# Patient Record
Sex: Female | Born: 1945 | Race: White | Hispanic: No | Smoking: Never smoker
Health system: Southern US, Community
[De-identification: ages and names within clinical notes are randomized; demographics above are authoritative.]

## PROBLEM LIST (undated history)

## (undated) DIAGNOSIS — M549 Dorsalgia, unspecified: Secondary | ICD-10-CM

## (undated) DIAGNOSIS — M1990 Unspecified osteoarthritis, unspecified site: Secondary | ICD-10-CM

## (undated) DIAGNOSIS — C642 Malignant neoplasm of left kidney, except renal pelvis: Secondary | ICD-10-CM

## (undated) DIAGNOSIS — Z87442 Personal history of urinary calculi: Secondary | ICD-10-CM

## (undated) DIAGNOSIS — K219 Gastro-esophageal reflux disease without esophagitis: Secondary | ICD-10-CM

## (undated) DIAGNOSIS — E785 Hyperlipidemia, unspecified: Secondary | ICD-10-CM

## (undated) DIAGNOSIS — R102 Pelvic and perineal pain: Secondary | ICD-10-CM

## (undated) DIAGNOSIS — I10 Essential (primary) hypertension: Secondary | ICD-10-CM

## (undated) DIAGNOSIS — N200 Calculus of kidney: Secondary | ICD-10-CM

## (undated) DIAGNOSIS — E010 Iodine-deficiency related diffuse (endemic) goiter: Secondary | ICD-10-CM

## (undated) DIAGNOSIS — C801 Malignant (primary) neoplasm, unspecified: Secondary | ICD-10-CM

## (undated) DIAGNOSIS — R0600 Dyspnea, unspecified: Secondary | ICD-10-CM

## (undated) DIAGNOSIS — R7303 Prediabetes: Secondary | ICD-10-CM

## (undated) DIAGNOSIS — I7300 Raynaud's syndrome without gangrene: Secondary | ICD-10-CM

## (undated) DIAGNOSIS — D219 Benign neoplasm of connective and other soft tissue, unspecified: Secondary | ICD-10-CM

## (undated) DIAGNOSIS — M439 Deforming dorsopathy, unspecified: Secondary | ICD-10-CM

## (undated) DIAGNOSIS — M8580 Other specified disorders of bone density and structure, unspecified site: Secondary | ICD-10-CM

## (undated) DIAGNOSIS — N809 Endometriosis, unspecified: Secondary | ICD-10-CM

## (undated) DIAGNOSIS — I5189 Other ill-defined heart diseases: Secondary | ICD-10-CM

## (undated) DIAGNOSIS — E538 Deficiency of other specified B group vitamins: Secondary | ICD-10-CM

## (undated) DIAGNOSIS — R112 Nausea with vomiting, unspecified: Secondary | ICD-10-CM

## (undated) DIAGNOSIS — I340 Nonrheumatic mitral (valve) insufficiency: Secondary | ICD-10-CM

## (undated) DIAGNOSIS — N6019 Diffuse cystic mastopathy of unspecified breast: Secondary | ICD-10-CM

## (undated) DIAGNOSIS — R413 Other amnesia: Secondary | ICD-10-CM

## (undated) DIAGNOSIS — M810 Age-related osteoporosis without current pathological fracture: Secondary | ICD-10-CM

## (undated) DIAGNOSIS — Z9889 Other specified postprocedural states: Secondary | ICD-10-CM

## (undated) HISTORY — DX: Hyperlipidemia, unspecified: E78.5

## (undated) HISTORY — DX: Gastro-esophageal reflux disease without esophagitis: K21.9

## (undated) HISTORY — DX: Dorsalgia, unspecified: M54.9

## (undated) HISTORY — DX: Age-related osteoporosis without current pathological fracture: M81.0

## (undated) HISTORY — DX: Benign neoplasm of connective and other soft tissue, unspecified: D21.9

## (undated) HISTORY — DX: Deforming dorsopathy, unspecified: M43.9

## (undated) HISTORY — PX: OOPHORECTOMY: SHX86

## (undated) HISTORY — PX: ABDOMINAL HYSTERECTOMY: SHX81

## (undated) HISTORY — DX: Pelvic and perineal pain: R10.2

## (undated) HISTORY — DX: Calculus of kidney: N20.0

---

## 1997-10-15 HISTORY — PX: BILATERAL OOPHORECTOMY: SHX1221

## 2004-11-20 ENCOUNTER — Ambulatory Visit: Payer: Self-pay | Admitting: Internal Medicine

## 2005-05-14 ENCOUNTER — Ambulatory Visit: Payer: Self-pay | Admitting: Internal Medicine

## 2005-05-23 ENCOUNTER — Ambulatory Visit: Payer: Self-pay | Admitting: Internal Medicine

## 2005-08-28 ENCOUNTER — Ambulatory Visit: Payer: Self-pay | Admitting: Internal Medicine

## 2006-07-09 ENCOUNTER — Ambulatory Visit: Payer: Self-pay | Admitting: Internal Medicine

## 2007-01-31 ENCOUNTER — Ambulatory Visit: Payer: Self-pay | Admitting: Internal Medicine

## 2007-06-24 ENCOUNTER — Ambulatory Visit: Payer: Self-pay | Admitting: Internal Medicine

## 2007-06-30 ENCOUNTER — Ambulatory Visit: Payer: Self-pay | Admitting: Internal Medicine

## 2007-08-12 ENCOUNTER — Ambulatory Visit: Payer: Self-pay | Admitting: Internal Medicine

## 2008-01-02 ENCOUNTER — Ambulatory Visit: Payer: Self-pay | Admitting: Obstetrics and Gynecology

## 2008-07-06 ENCOUNTER — Ambulatory Visit: Payer: Self-pay | Admitting: Obstetrics and Gynecology

## 2008-08-17 ENCOUNTER — Ambulatory Visit: Payer: Self-pay | Admitting: Internal Medicine

## 2009-08-22 ENCOUNTER — Ambulatory Visit: Payer: Self-pay | Admitting: Internal Medicine

## 2010-08-25 ENCOUNTER — Ambulatory Visit: Payer: Self-pay | Admitting: Internal Medicine

## 2011-09-03 ENCOUNTER — Ambulatory Visit: Payer: Self-pay | Admitting: Internal Medicine

## 2012-09-18 ENCOUNTER — Ambulatory Visit: Payer: Self-pay | Admitting: Internal Medicine

## 2013-04-30 DIAGNOSIS — D239 Other benign neoplasm of skin, unspecified: Secondary | ICD-10-CM

## 2013-04-30 HISTORY — DX: Other benign neoplasm of skin, unspecified: D23.9

## 2013-09-22 ENCOUNTER — Ambulatory Visit: Payer: Self-pay | Admitting: Internal Medicine

## 2014-03-16 DIAGNOSIS — I10 Essential (primary) hypertension: Secondary | ICD-10-CM | POA: Insufficient documentation

## 2014-03-16 DIAGNOSIS — M1990 Unspecified osteoarthritis, unspecified site: Secondary | ICD-10-CM | POA: Insufficient documentation

## 2014-03-16 DIAGNOSIS — E785 Hyperlipidemia, unspecified: Secondary | ICD-10-CM | POA: Insufficient documentation

## 2014-03-16 DIAGNOSIS — E010 Iodine-deficiency related diffuse (endemic) goiter: Secondary | ICD-10-CM | POA: Insufficient documentation

## 2014-03-16 DIAGNOSIS — I7300 Raynaud's syndrome without gangrene: Secondary | ICD-10-CM | POA: Insufficient documentation

## 2014-03-16 DIAGNOSIS — M810 Age-related osteoporosis without current pathological fracture: Secondary | ICD-10-CM | POA: Insufficient documentation

## 2014-04-22 DIAGNOSIS — N6019 Diffuse cystic mastopathy of unspecified breast: Secondary | ICD-10-CM | POA: Insufficient documentation

## 2014-04-22 DIAGNOSIS — N200 Calculus of kidney: Secondary | ICD-10-CM | POA: Insufficient documentation

## 2014-09-23 ENCOUNTER — Ambulatory Visit: Payer: Self-pay | Admitting: Internal Medicine

## 2015-05-09 ENCOUNTER — Ambulatory Visit
Admission: RE | Admit: 2015-05-09 | Discharge: 2015-05-09 | Disposition: A | Discharge status: Home / Self Care | Payer: Medicare Other | Source: Ambulatory Visit | Attending: Gastroenterology | Admitting: Gastroenterology

## 2015-05-09 ENCOUNTER — Ambulatory Visit: Payer: Medicare Other | Admitting: Anesthesiology

## 2015-05-09 ENCOUNTER — Encounter
Admission: RE | Disposition: A | Discharge status: Home / Self Care | Payer: Self-pay | Source: Ambulatory Visit | Attending: Gastroenterology

## 2015-05-09 DIAGNOSIS — D123 Benign neoplasm of transverse colon: Secondary | ICD-10-CM | POA: Insufficient documentation

## 2015-05-09 DIAGNOSIS — E785 Hyperlipidemia, unspecified: Secondary | ICD-10-CM | POA: Diagnosis not present

## 2015-05-09 DIAGNOSIS — K648 Other hemorrhoids: Secondary | ICD-10-CM | POA: Diagnosis not present

## 2015-05-09 DIAGNOSIS — I10 Essential (primary) hypertension: Secondary | ICD-10-CM | POA: Diagnosis not present

## 2015-05-09 DIAGNOSIS — Z1211 Encounter for screening for malignant neoplasm of colon: Secondary | ICD-10-CM | POA: Diagnosis present

## 2015-05-09 HISTORY — DX: Raynaud's syndrome without gangrene: I73.00

## 2015-05-09 HISTORY — DX: Hyperlipidemia, unspecified: E78.5

## 2015-05-09 HISTORY — DX: Endometriosis, unspecified: N80.9

## 2015-05-09 HISTORY — DX: Essential (primary) hypertension: I10

## 2015-05-09 HISTORY — DX: Calculus of kidney: N20.0

## 2015-05-09 HISTORY — DX: Iodine-deficiency related diffuse (endemic) goiter: E01.0

## 2015-05-09 HISTORY — PX: COLONOSCOPY WITH PROPOFOL: SHX5780

## 2015-05-09 HISTORY — DX: Other specified disorders of bone density and structure, unspecified site: M85.80

## 2015-05-09 HISTORY — DX: Diffuse cystic mastopathy of unspecified breast: N60.19

## 2015-05-09 SURGERY — COLONOSCOPY WITH PROPOFOL
Anesthesia: General

## 2015-05-09 MED ORDER — PROPOFOL 10 MG/ML IV BOLUS
INTRAVENOUS | Status: DC | PRN
  Administered 2015-05-09: 35 mg via INTRAVENOUS

## 2015-05-09 MED ORDER — PHENYLEPHRINE HCL 10 MG/ML IJ SOLN
INTRAMUSCULAR | Status: DC | PRN
  Administered 2015-05-09 (×5): 100 ug via INTRAVENOUS
  Administered 2015-05-09: 50 ug via INTRAVENOUS

## 2015-05-09 MED ORDER — SODIUM CHLORIDE 0.9 % IV SOLN
INTRAVENOUS | Status: DC
  Administered 2015-05-09: 1000 mL via INTRAVENOUS

## 2015-05-09 MED ORDER — MIDAZOLAM HCL 5 MG/5ML IJ SOLN
INTRAMUSCULAR | Status: DC | PRN
  Administered 2015-05-09: 1 mg via INTRAVENOUS

## 2015-05-09 MED ORDER — SODIUM CHLORIDE 0.9 % IV SOLN: INTRAVENOUS | Status: DC

## 2015-05-09 MED ORDER — PROPOFOL INFUSION 10 MG/ML OPTIME
INTRAVENOUS | Status: DC | PRN
  Administered 2015-05-09: 120 ug/kg/min via INTRAVENOUS

## 2015-05-09 MED ORDER — FENTANYL CITRATE (PF) 100 MCG/2ML IJ SOLN
INTRAMUSCULAR | Status: DC | PRN
  Administered 2015-05-09: 50 ug via INTRAVENOUS

## 2015-05-09 NOTE — Transfer of Care (Signed)
Immediate Anesthesia Transfer of Care Note  Patient: Jessica Morrison  Procedure(s) Performed: Procedure(s): COLONOSCOPY WITH PROPOFOL (N/A)  Patient Location: PACU  Anesthesia Type:General  Level of Consciousness: sedated  Airway & Oxygen Therapy: Patient Spontanous Breathing and Patient connected to nasal cannula oxygen  Post-op Assessment: Report given to RN and Post -op Vital signs reviewed and stable  Post vital signs: Reviewed and stable  Last Vitals:  Filed Vitals:   05/09/15 1020  BP: 94/52  Pulse:   Temp: 36 C  Resp: 16    Complications: No apparent anesthesia complications

## 2015-05-09 NOTE — Anesthesia Postprocedure Evaluation (Signed)
  Anesthesia Post-op Note  Patient: Jessica Morrison  Procedure(s) Performed: Procedure(s): COLONOSCOPY WITH PROPOFOL (N/A)  Anesthesia type:General  Patient location: PACU  Post pain: Pain level controlled  Post assessment: Post-op Vital signs reviewed, Patient's Cardiovascular Status Stable, Respiratory Function Stable, Patent Airway and No signs of Nausea or vomiting  Post vital signs: Reviewed and stable  Last Vitals:  Filed Vitals:   05/09/15 1049  BP: 115/66  Pulse: 71  Temp:   Resp: 19    Level of consciousness: awake, alert  and patient cooperative  Complications: No apparent anesthesia complications

## 2015-05-09 NOTE — Anesthesia Preprocedure Evaluation (Signed)
Anesthesia Evaluation  Patient identified by MRN, date of birth, ID band Patient awake    Reviewed: Allergy & Precautions, NPO status , Patient's Chart, lab work & pertinent test results  History of Anesthesia Complications (+) PONV  Airway Mallampati: II  TM Distance: >3 FB Neck ROM: Full    Dental  (+) Teeth Intact   Pulmonary          Cardiovascular     Neuro/Psych    GI/Hepatic GERD-  Controlled,  Endo/Other    Renal/GU Renal disease (Stones)     Musculoskeletal   Abdominal   Peds  Hematology   Anesthesia Other Findings   Reproductive/Obstetrics                             Anesthesia Physical Anesthesia Plan  ASA: II  Anesthesia Plan: General   Post-op Pain Management:    Induction: Intravenous  Airway Management Planned: Nasal Cannula  Additional Equipment:   Intra-op Plan:   Post-operative Plan:   Informed Consent: I have reviewed the patients History and Physical, chart, labs and discussed the procedure including the risks, benefits and alternatives for the proposed anesthesia with the patient or authorized representative who has indicated his/her understanding and acceptance.     Plan Discussed with:   Anesthesia Plan Comments:         Anesthesia Quick Evaluation

## 2015-05-09 NOTE — Op Note (Signed)
[facility] Gastroenterology Patient Name: Jessica Morrison Procedure Date: 05/09/2015 9:19 AM MRN: [id number] Account #: [id number] Date of Birth: 16-Aug-1946 Admit Type: Outpatient Age: 69 Room: [facility] ENDO ROOM 2 Gender: Female Note Status: Finalized Procedure:         Colonoscopy Indications:       Screening for colorectal malignant neoplasm, This is the                     patient's first colonoscopy Patient Profile:   This is a 69 year old female. Providers:         Gerrit Heck. Rayann Heman, MD Referring MD:      Leonie Douglas. Doy Hutching, MD (Referring MD) Medicines:         Propofol per Anesthesia Complications:     No immediate complications. Procedure:         Pre-Anesthesia Assessment:                    - Prior to the procedure, a History and Physical was                     performed, and patient medications, allergies and                     sensitivities were reviewed. The patient's tolerance of                     previous anesthesia was reviewed.                    After obtaining informed consent, the colonoscope was                     passed under direct vision. Throughout the procedure, the                     patient's blood pressure, pulse, and oxygen saturations                     were monitored continuously. The Colonoscope was                     introduced through the anus and advanced to the the                     terminal ileum. The patient tolerated the procedure well.                     The quality of the bowel preparation was excellent. The                     colonoscopy was extremely difficult due to a tortuous                     colon. Successful completion of the procedure was aided by                     using manual pressure and changing patient position. Findings:      A 8 mm polyp was found in the proximal transverse colon. The polyp was       flat. The polyp was removed with a hot snare. Resection and retrieval       were complete.      A  3 mm polyp was found in the mid transverse colon.  The polyp was       sessile. The polyp was removed with a jumbo cold forceps. Resection and       retrieval were complete.      Internal hemorrhoids were found during retroflexion. The hemorrhoids       were Grade I (internal hemorrhoids that do not prolapse).      The exam was otherwise without abnormality.      The perianal exam findings include non-thrombosed external hemorrhoids. Impression:        - One 8 mm polyp in the proximal transverse colon.                     Resected and retrieved.                    - One 3 mm polyp in the mid transverse colon. Resected and                     retrieved.                    - Internal hemorrhoids.                    - The examination was otherwise normal.                    - Non-thrombosed external hemorrhoids found on perianal                     exam. Recommendation:    - Observe patient in GI recovery unit.                    - Continue present medications.                    - Repeat colonoscopy for surveillance based on pathology                     results, but procedure was very difficult due to tortuos                     colon and need to consider risks and benefits before                     repeating.                    - Return to referring physician.                    - The findings and recommendations were discussed with the                     patient.                    - The findings and recommendations were discussed with the                     patient's family. Procedure Code(s): --- Professional ---                    [id number], Colonoscopy, flexible; with removal of tumor(s),                     polyp(s), or other lesion(s) by snare technique  45380, 59, Colonoscopy, flexible; with biopsy, single or                     multiple CPT copyright 2014 American Medical Association. All rights reserved. The codes documented in this report are preliminary and  upon coder review may  be revised to meet current compliance requirements. Mellody Life, MD 05/09/2015 10:19:38 AM This report has been signed electronically. Number of Addenda: 0 Note Initiated On: 05/09/2015 9:19 AM Scope Withdrawal Time: 0 hours 20 minutes 39 seconds  Total Procedure Duration: 0 hours 40 minutes 22 seconds       [facility]

## 2015-05-09 NOTE — Discharge Instructions (Signed)

## 2015-05-09 NOTE — H&P (Signed)
  Primary Care Physician:  Idelle Crouch, MD  Pre-Procedure History & Physical: HPI:  Jessica Morrison is a 69 y.o. female is here for an colonoscopy.   Past Medical History  Diagnosis Date  . Endometriosis   . Nephrolithiasis   . Hyperlipemia   . Fibrocystic disease of breast   . Raynaud disease   . Thyromegaly   . Osteopenia   . Hypertension     Past Surgical History  Procedure Laterality Date  . Abdominal hysterectomy    . Oophorectomy      Prior to Admission medications   Medication Sig Start Date End Date Taking? Authorizing Provider  aspirin EC 81 MG tablet Take 81 mg by mouth daily.   Yes Historical Provider, MD  Calcium Carb-Cholecalciferol 500-125 MG-UNIT TABS Take 1 tablet by mouth.   Yes Historical Provider, MD  Cholecalciferol 2000 UNITS CAPS Take 1 capsule by mouth.   Yes Historical Provider, MD  raloxifene (EVISTA) 60 MG tablet Take 60 mg by mouth daily.   Yes Historical Provider, MD  rosuvastatin (CRESTOR) 5 MG tablet Take 5 mg by mouth daily.   Yes Historical Provider, MD    Allergies as of 04/26/2015  . (Not on File)    No family history on file.  History   Social History  . Marital Status: Married    Spouse Name: N/A  . Number of Children: N/A  . Years of Education: N/A   Occupational History  . Not on file.   Social History Main Topics  . Smoking status: Not on file  . Smokeless tobacco: Not on file  . Alcohol Use: Not on file  . Drug Use: Not on file  . Sexual Activity: Not on file   Other Topics Concern  . Not on file   Social History Narrative  . No narrative on file     Physical Exam: BP 144/74 mmHg  Pulse 90  Temp(Src) 98.6 F (37 C) (Tympanic)  Resp 16  Ht 4\' 10"  (1.473 m)  Wt 51.71 kg (114 lb)  BMI 23.83 kg/m2  SpO2 92% General:   Alert,  pleasant and cooperative in NAD Head:  Normocephalic and atraumatic. Neck:  Supple; no masses or thyromegaly. Lungs:  Clear throughout to auscultation.    Heart:  Regular rate  and rhythm. Abdomen:  Soft, nontender and nondistended. Normal bowel sounds, without guarding, and without rebound.   Neurologic:  Alert and  oriented x4;  grossly normal neurologically.  Impression/Plan: Jessica Morrison is here for an colonoscopy to be performed for screening, avg risk   Risks, benefits, limitations, and alternatives regarding  colonoscpy have been reviewed with the patient.  Questions have been answered.  All parties agreeable.   Jessica Class, MD  05/09/2015, 9:25 AM

## 2015-05-10 ENCOUNTER — Encounter: Payer: Self-pay | Admitting: Gastroenterology

## 2015-05-10 LAB — SURGICAL PATHOLOGY

## 2015-08-16 ENCOUNTER — Ambulatory Visit (INDEPENDENT_AMBULATORY_CARE_PROVIDER_SITE_OTHER): Payer: Medicare Other | Admitting: Obstetrics and Gynecology

## 2015-08-16 ENCOUNTER — Encounter: Payer: Self-pay | Admitting: Obstetrics and Gynecology

## 2015-08-16 VITALS — BP 129/72 | HR 83 | Ht <= 58 in | Wt 116.4 lb

## 2015-08-16 DIAGNOSIS — N952 Postmenopausal atrophic vaginitis: Secondary | ICD-10-CM | POA: Diagnosis not present

## 2015-08-16 DIAGNOSIS — M810 Age-related osteoporosis without current pathological fracture: Secondary | ICD-10-CM

## 2015-08-16 DIAGNOSIS — N809 Endometriosis, unspecified: Secondary | ICD-10-CM | POA: Insufficient documentation

## 2015-08-16 MED ORDER — RALOXIFENE HCL 60 MG PO TABS: 60.0000 mg | ORAL_TABLET | Freq: Every day | ORAL | Status: DC

## 2015-08-16 NOTE — Patient Instructions (Signed)
1.  Continue with calcium and vitamin D supplementation. 2.  Continue with weightbearing exercise. 3.  E Vista 60 mg daily is refilled. 4.  Over-the-counter lubricants and virginal, as well as recommended for lubrication. 5.  Stool guaiac testing is not needed this year due to colonoscopy in July 2016. 6.  Patient needs yearly mammograms. 7.  Return in 1 year.

## 2015-08-16 NOTE — Progress Notes (Signed)
Patient ID: Jessica Morrison, female   DOB: Jul 14, 1946, 69 y.o.   MRN: [id number] ANNUAL PREVENTATIVE CARE GYN  ENCOUNTER NOTE  Subjective:       Jessica Morrison is a 69 y.o. G68P1101 female here for a routine annual gynecologic exam.  Current complaints: 1.osteoporosis 2.  Vaginal atrophy.  Patient presents for follow-up on above issues.  She is status post hysterectomy.  She is on Evista for osteoporosis.  She has concerns about vulvar health as mother is deceased from melanoma of the vulva.  Patient has not noted any vulvar lesions.  She does have vaginal atrophy and is not frequently sexually active.  She is advised to use OTC lubricants virginal well.  She is taking calcium with vitamin D.  She did have colonoscopy this past year which showed 2 polyps.  She is to have repeat colonoscopy in 5 years.   Gynecologic History No LMP recorded. Patient has had a hysterectomy. Contraception: status post hysterectomy Last Pap: unsure last pap. Results were: unsure Last mammogram: 2015. Results were: normal  Obstetric History OB History  Gravida Para Term Preterm AB SAB TAB Ectopic Multiple Living  2 2 1 1      1     # Outcome Date GA Lbr Len/2nd Weight Sex Delivery Anes PTL Lv  2 Term 1976   6 lb (2.722 kg) F CS-LTranv   Y  1 Preterm 1976        FD      Past Medical History  Diagnosis Date  . Endometriosis   . Nephrolithiasis   . Hyperlipemia   . Fibrocystic disease of breast   . Raynaud disease   . Thyromegaly   . Osteopenia   . Hypertension   . Curvature of spine   . Back pain   . Fibroid   . Kidney stone   . Osteoporosis   . GERD (gastroesophageal reflux disease)   . Pelvic pain in female   . Endometriosis   . HLD (hyperlipidemia)     Past Surgical History  Procedure Laterality Date  . Abdominal hysterectomy    . Oophorectomy    . Colonoscopy with propofol N/A 05/09/2015    Procedure: COLONOSCOPY WITH PROPOFOL;  Surgeon: Josefine Class, MD;  Location: [facility]  ENDOSCOPY;  Service: Endoscopy;  Laterality: N/A;  . Cesarean section    . Bilateral oophorectomy  1999    Current Outpatient Prescriptions on File Prior to Visit  Medication Sig Dispense Refill  . aspirin EC 81 MG tablet Take 81 mg by mouth daily.    . calcium-vitamin D (CALCIUM 500/D) 500-200 MG-UNIT tablet Take by mouth.    . Cholecalciferol 2000 UNITS CAPS Take 1 capsule by mouth.    . raloxifene (EVISTA) 60 MG tablet Take 60 mg by mouth daily.    . rosuvastatin (CRESTOR) 5 MG tablet Take 5 mg by mouth daily.     No current facility-administered medications on file prior to visit.    Allergies  Allergen Reactions  . Codeine Nausea And Vomiting  . Neosporin Wound Cleanser [Benzalkonium Chloride] Other (See Comments)    Skin blisters  . Penicillins     Social History   Social History  . Marital Status: Married    Spouse Name: N/A  . Number of Children: N/A  . Years of Education: N/A   Occupational History  . Not on file.   Social History Main Topics  . Smoking status: Never Smoker   . Smokeless tobacco: Not on  file  . Alcohol Use: No  . Drug Use: No  . Sexual Activity: Not Currently    Birth Control/ Protection: Surgical   Other Topics Concern  . Not on file   Social History Narrative    Family History  Problem Relation Age of Onset  . Emphysema Father   . Cancer Neg Hx   . Heart disease Neg Hx   . Diabetes Neg Hx     The following portions of the patient's history were reviewed and updated as appropriate: allergies, current medications, past family history, past medical history, past social history, past surgical history and problem list.  Review of Systems ROS Review of Systems - General ROS: negative for - chills, fatigue, fever, hot flashes, night sweats, weight gain or weight loss Psychological ROS: negative for - anxiety, decreased libido, depression, mood swings, physical abuse or sexual abuse Ophthalmic ROS: negative for - blurry vision, eye  pain or loss of vision ENT ROS: negative for - headaches, hearing change, visual changes or vocal changes Allergy and Immunology ROS: negative for - hives, itchy/watery eyes or seasonal allergies Hematological and Lymphatic ROS: negative for - bleeding problems, bruising, swollen lymph nodes or weight loss Endocrine ROS: negative for - galactorrhea, hair pattern changes, hot flashes, malaise/lethargy, mood swings, palpitations, polydipsia/polyuria, skin changes, temperature intolerance or unexpected weight changes Breast ROS: negative for - new or changing breast lumps or nipple discharge Respiratory ROS: negative for - cough or shortness of breath Cardiovascular ROS: negative for - chest pain, irregular heartbeat, palpitations or shortness of breath Gastrointestinal ROS: no abdominal pain, change in bowel habits, or black or bloody stools Genito-Urinary ROS: no dysuria, trouble voiding, or hematuria Musculoskeletal ROS: negative for - joint pain or joint stiffness Neurological ROS: negative for - bowel and bladder control changes Dermatological ROS: negative for rash and skin lesion changes   Objective:   BP 129/72 mmHg  Pulse 83  Ht 4\' 10"  (1.473 m)  Wt 116 lb 6.4 oz (52.799 kg)  BMI 24.33 kg/m2 CONSTITUTIONAL: Well-developed, well-nourished female in no acute distress.  PSYCHIATRIC: Normal mood and affect. Normal behavior. Normal judgment and thought content. Bellamy: Alert and oriented to person, place, and time. Normal muscle tone coordination. No cranial nerve deficit noted. HENT:  Normocephalic, atraumatic, External right and left ear normal. Oropharynx is clear and moist EYES: Conjunctivae and EOM are normal. Pupils are equal, round, and reactive to light. No scleral icterus.  NECK: Normal range of motion, supple, no masses.  Normal thyroid.  SKIN: Skin is warm and dry. No rash noted. Not diaphoretic. No erythema. No pallor. CARDIOVASCULAR: Normal heart rate noted, regular  rhythm, no murmur. RESPIRATORY: Clear to auscultation bilaterally. Effort and breath sounds normal, no problems with respiration noted. BREASTS: Symmetric in size. No masses, skin changes, nipple drainage, or lymphadenopathy.dense breasts with fibrocystic changes present ABDOMEN: Soft, normal bowel sounds, no distention noted.  No tenderness, rebound or guarding. Midline scar is healed; no hernias BLADDER: Normal PELVIC:  External Genitalia: Normal; no lesions  BUS: Normal  Vagina: moderate atrophy  Cervix: surgically absent  Uterus: surgically absent  Adnexa: Normal  RV: External Exam NormaI, No Rectal Masses and Normal Sphincter tone  MUSCULOSKELETAL: Normal range of motion. No tenderness.  No cyanosis, clubbing, or edema.  2+ distal pulses. LYMPHATIC: No Axillary, Supraclavicular, or Inguinal Adenopathy.    Assessment:   Annual gynecologic examination 69 y.o. Contraception: status post hysterectomy Normal BMI Problem List Items Addressed This Visit    None  Plan:  Pap: Not needed Mammogram: Ordered thru pcp Stool Guaiac Testing:  Not Indicated- colonoscopy- 04/2015 2 polyps removed Labs: thur pcp Routine preventative health maintenance measures emphasized: Exercise/Diet/Weight control and Alcohol/Substance use risks Evista refilled 1 year. Continue with calcium and vitamin D supplementation Return to [place], [place]  Brayton Mars, MD  Note: This dictation was prepared with Dragon dictation along with smaller phrase technology. Any transcriptional errors that result from this process are unintentional.

## 2015-09-28 ENCOUNTER — Other Ambulatory Visit: Payer: Self-pay | Admitting: Internal Medicine

## 2015-09-28 DIAGNOSIS — Z1231 Encounter for screening mammogram for malignant neoplasm of breast: Secondary | ICD-10-CM

## 2015-10-13 ENCOUNTER — Other Ambulatory Visit: Payer: Self-pay | Admitting: Internal Medicine

## 2015-10-13 ENCOUNTER — Ambulatory Visit
Admission: RE | Admit: 2015-10-13 | Discharge: 2015-10-13 | Disposition: A | Discharge status: Home / Self Care | Payer: Medicare Other | Source: Ambulatory Visit | Attending: Internal Medicine | Admitting: Internal Medicine

## 2015-10-13 DIAGNOSIS — Z1231 Encounter for screening mammogram for malignant neoplasm of breast: Secondary | ICD-10-CM | POA: Insufficient documentation

## 2016-08-16 ENCOUNTER — Other Ambulatory Visit: Payer: Self-pay | Admitting: Obstetrics and Gynecology

## 2016-08-20 NOTE — Progress Notes (Signed)
Patient ID: Jessica Morrison, female   DOB: [birth year], 70 y.o.   MRN: [id number] ANNUAL PREVENTATIVE CARE GYN  ENCOUNTER NOTE  Subjective:       Jessica Morrison is a 70 y.o. G56P1101 female here for a routine annual gynecologic exam.  Current complaints:    She is status post hysterectomy. She is not sexually active.  She is on Evista for osteoporosis.    Patient has to care for her husband who has had Parkinson's for the past 17 years. Family history of vulvar melanoma-mom  Gynecologic History No LMP recorded. Patient has had a hysterectomy. Contraception: status post hysterectomy Last Pap: unsure last pap. Results were: unsure Last mammogram: 10/13/15. Results were: normal Colonoscopy: 2016  Obstetric History OB History  Gravida Para Term Preterm AB Living  2 2 1 1   1   SAB TAB Ectopic Multiple Live Births          1    # Outcome Date GA Lbr Len/2nd Weight Sex Delivery Anes PTL Lv  2 Term 1976   6 lb (2.722 kg) F CS-LTranv   LIV  1 Preterm 1976        FD      Past Medical History:  Diagnosis Date  . Back pain   . Curvature of spine   . Endometriosis   . Endometriosis   . Fibrocystic disease of breast   . Fibroid   . GERD (gastroesophageal reflux disease)   . HLD (hyperlipidemia)   . Hyperlipemia   . Hypertension   . Kidney stone   . Nephrolithiasis   . Osteopenia   . Osteoporosis   . Pelvic pain in female   . Raynaud disease   . Thyromegaly     Past Surgical History:  Procedure Laterality Date  . ABDOMINAL HYSTERECTOMY    . BILATERAL OOPHORECTOMY  1999  . CESAREAN SECTION    . COLONOSCOPY WITH PROPOFOL N/A 05/09/2015   Procedure: COLONOSCOPY WITH PROPOFOL;  Surgeon: Josefine Class, MD;  Location: [facility] ENDOSCOPY;  Service: Endoscopy;  Laterality: N/A;  . OOPHORECTOMY      Current Outpatient Prescriptions on File Prior to Visit  Medication Sig Dispense Refill  . aspirin EC 81 MG tablet Take 81 mg by mouth daily.    . calcium-vitamin D (CALCIUM  500/D) 500-200 MG-UNIT tablet Take by mouth.    . Cholecalciferol 2000 UNITS CAPS Take 1 capsule by mouth.    . raloxifene (EVISTA) 60 MG tablet take 1 tablet by mouth once daily 30 tablet 0  . rosuvastatin (CRESTOR) 5 MG tablet Take 5 mg by mouth daily.     No current facility-administered medications on file prior to visit.     Allergies  Allergen Reactions  . Codeine Nausea And Vomiting  . Neosporin Wound Cleanser [Benzalkonium Chloride] Other (See Comments)    Skin blisters  . Penicillins     Social History   Social History  . Marital status: Married    Spouse name: N/A  . Number of children: N/A  . Years of education: N/A   Occupational History  . Not on file.   Social History Main Topics  . Smoking status: Never Smoker  . Smokeless tobacco: Not on file  . Alcohol use No  . Drug use: No  . Sexual activity: Not Currently    Birth control/ protection: Surgical   Other Topics Concern  . Not on file   Social History Narrative  . No narrative on file  Family History  Problem Relation Age of Onset  . Emphysema Father   . Cancer Neg Hx   . Heart disease Neg Hx   . Diabetes Neg Hx   . Breast cancer Neg Hx     The following portions of the patient's history were reviewed and updated as appropriate: allergies, current medications, past family history, past medical history, past social history, past surgical history and problem list.  Review of Systems ROS Review of Systems - General ROS: negative for - chills, fatigue, fever, hot flashes, night sweats, weight gain or weight loss Psychological ROS: negative for - anxiety, decreased libido, depression, mood swings, physical abuse or sexual abuse Ophthalmic ROS: negative for - blurry vision, eye pain or loss of vision ENT ROS: negative for - headaches, hearing change, visual changes or vocal changes Allergy and Immunology ROS: negative for - hives, itchy/watery eyes or seasonal allergies Hematological and  Lymphatic ROS: negative for - bleeding problems, bruising, swollen lymph nodes or weight loss Endocrine ROS: negative for - galactorrhea, hair pattern changes, hot flashes, malaise/lethargy, mood swings, palpitations, polydipsia/polyuria, skin changes, temperature intolerance or unexpected weight changes Breast ROS: negative for - new or changing breast lumps or nipple discharge Respiratory ROS: negative for - cough or shortness of breath Cardiovascular ROS: negative for - chest pain, irregular heartbeat, palpitations or shortness of breath Gastrointestinal ROS: no abdominal pain, change in bowel habits, or black or bloody stools Genito-Urinary ROS: no dysuria, trouble voiding, or hematuria Musculoskeletal ROS: negative for - joint pain or joint stiffness Neurological ROS: negative for - bowel and bladder control changes Dermatological ROS: negative for rash and skin lesion changes   Objective:   BP 132/76 (BP Location: Left Arm, Patient Position: Sitting, Cuff Size: Normal)   Pulse 67   Resp 16   Ht 4' 11.5" (1.511 m)   Wt 114 lb (51.7 kg)   BMI 22.64 kg/m  CONSTITUTIONAL: Well-developed, well-nourished female in no acute distress.  PSYCHIATRIC: Normal mood and affect. Normal behavior. Normal judgment and thought content. [place]: Alert and oriented to person, place, and time. Normal muscle tone coordination. No cranial nerve deficit noted. HENT:  Normocephalic, atraumatic, External right and left ear normal. Oropharynx is clear and moist EYES: Conjunctivae and EOM are normal. Pupils are equal, round, and reactive to light. No scleral icterus.  NECK: Normal range of motion, supple, no masses.  Normal thyroid.  SKIN: Skin is warm and dry. No rash noted. Not diaphoretic. No erythema. No pallor.Left inner thigh black macule 4 mm CARDIOVASCULAR: Normal heart rate noted, regular rhythm, no murmur. RESPIRATORY: Clear to auscultation bilaterally. Effort and breath sounds normal, no problems  with respiration noted. BREASTS: Symmetric in size. No masses, skin changes, nipple drainage, or lymphadenopathy.dense breasts with fibrocystic changes present ABDOMEN: Soft, normal bowel sounds, no distention noted.  No tenderness, rebound or guarding. Midline scar is healed; no hernias BLADDER: Normal PELVIC:  External Genitalia: Normal; no lesions  BUS: Normal  Vagina: Severe atrophy atrophy; single digit exam without palpable abnormalities  Cervix: surgically absent  Uterus: surgically absent  Adnexa: Normal  RV: External Exam NormaI, No Rectal Masses and Normal Sphincter tone  MUSCULOSKELETAL: Normal range of motion. No tenderness.  No cyanosis, clubbing, or edema.  2+ distal pulses. LYMPHATIC: No Axillary, Supraclavicular, or Inguinal Adenopathy.    Assessment:   Annual gynecologic examination 70 y.o. Contraception: status post hysterectomy Normal BMI Problem List Items Addressed This Visit      Musculoskeletal and Integument   OP (  osteoporosis)     Genitourinary   Vaginal atrophy    Other Visit Diagnoses    Well woman exam with routine gynecological exam    -  Primary    Family history of melanoma  Plan:  Pap: Not needed Mammogram: Ordered  Stool Guaiac Testing:  ordered Labs: thur pcp Routine preventative health maintenance measures emphasized: Exercise/Diet/Weight control and Alcohol/Substance use risks Evista refilled 1 year. Continue with calcium and vitamin D supplementation Recommend dermatology evaluation of left thigh macule 4 mm-excisional biopsy? Return to [place], [place]  Brayton Mars, MD   Note: This dictation was prepared with Dragon dictation along with smaller phrase technology. Any transcriptional errors that result from this process are unintentional.

## 2016-08-23 ENCOUNTER — Encounter: Payer: Self-pay | Admitting: Obstetrics and Gynecology

## 2016-08-23 ENCOUNTER — Ambulatory Visit (INDEPENDENT_AMBULATORY_CARE_PROVIDER_SITE_OTHER): Payer: Medicare Other | Admitting: Obstetrics and Gynecology

## 2016-08-23 VITALS — BP 132/76 | HR 67 | Resp 16 | Ht 59.5 in | Wt 114.0 lb

## 2016-08-23 DIAGNOSIS — N952 Postmenopausal atrophic vaginitis: Secondary | ICD-10-CM

## 2016-08-23 DIAGNOSIS — Z1211 Encounter for screening for malignant neoplasm of colon: Secondary | ICD-10-CM | POA: Diagnosis not present

## 2016-08-23 DIAGNOSIS — Z01419 Encounter for gynecological examination (general) (routine) without abnormal findings: Secondary | ICD-10-CM | POA: Diagnosis not present

## 2016-08-23 DIAGNOSIS — M810 Age-related osteoporosis without current pathological fracture: Secondary | ICD-10-CM

## 2016-08-23 DIAGNOSIS — Z1239 Encounter for other screening for malignant neoplasm of breast: Secondary | ICD-10-CM

## 2016-08-23 DIAGNOSIS — Z808 Family history of malignant neoplasm of other organs or systems: Secondary | ICD-10-CM | POA: Diagnosis not present

## 2016-08-23 DIAGNOSIS — L988 Other specified disorders of the skin and subcutaneous tissue: Secondary | ICD-10-CM

## 2016-08-23 DIAGNOSIS — Z1231 Encounter for screening mammogram for malignant neoplasm of breast: Secondary | ICD-10-CM

## 2016-08-23 MED ORDER — RALOXIFENE HCL 60 MG PO TABS: 60.0000 mg | ORAL_TABLET | Freq: Every day | ORAL | 3 refills | Status: DC

## 2016-08-23 NOTE — Patient Instructions (Signed)
1. No Pap smear is needed 2. Mammogram is ordered 3. Stool guaiac cards are given for colon cancer screening 4. Recommend dermatology evaluate left thigh macule for excisional biopsy 5. Return in 1 year  Menopause is a normal process in which your reproductive ability comes to an end. This process happens gradually over a span of months to years, usually between the ages of 57 and 34. Menopause is complete when you have missed 12 consecutive menstrual periods. It is important to talk with your health care provider about some of the most common conditions that affect postmenopausal women, such as heart disease, cancer, and bone loss (osteoporosis). Adopting a healthy lifestyle and getting preventive care can help to promote your health and wellness. Those actions can also lower your chances of developing some of these common conditions. WHAT SHOULD I KNOW ABOUT MENOPAUSE? During menopause, you may experience a number of symptoms, such as:  Moderate-to-severe hot flashes.  Night sweats.  Decrease in sex drive.  Mood swings.  Headaches.  Tiredness.  Irritability.  Memory problems.  Insomnia. Choosing to treat or not to treat menopausal changes is an individual decision that you make with your health care provider. WHAT SHOULD I KNOW ABOUT HORMONE REPLACEMENT THERAPY AND SUPPLEMENTS? Hormone therapy products are effective for treating symptoms that are associated with menopause, such as hot flashes and night sweats. Hormone replacement carries certain risks, especially as you become older. If you are thinking about using estrogen or estrogen with progestin treatments, discuss the benefits and risks with your health care provider. WHAT SHOULD I KNOW ABOUT HEART DISEASE AND STROKE? Heart disease, heart attack, and stroke become more likely as you age. This may be due, in part, to the hormonal changes that your body experiences during menopause. These can affect how your body processes  dietary fats, triglycerides, and cholesterol. Heart attack and stroke are both medical emergencies. There are many things that you can do to help prevent heart disease and stroke:  Have your blood pressure checked at least every 1-2 years. High blood pressure causes heart disease and increases the risk of stroke.  If you are 51-64 years old, ask your health care provider if you should take aspirin to prevent a heart attack or a stroke.  Do not use any tobacco products, including cigarettes, chewing tobacco, or electronic cigarettes. If you need help quitting, ask your health care provider.  It is important to eat a healthy diet and maintain a healthy weight.  Be sure to include plenty of vegetables, fruits, low-fat dairy products, and lean protein.  Avoid eating foods that are high in solid fats, added sugars, or salt (sodium).  Get regular exercise. This is one of the most important things that you can do for your health.  Try to exercise for at least 150 minutes each week. The type of exercise that you do should increase your heart rate and make you sweat. This is known as moderate-intensity exercise.  Try to do strengthening exercises at least twice each week. Do these in addition to the moderate-intensity exercise.  Know your numbers.Ask your health care provider to check your cholesterol and your blood glucose. Continue to have your blood tested as directed by your health care provider. WHAT SHOULD I KNOW ABOUT CANCER SCREENING? There are several types of cancer. Take the following steps to reduce your risk and to catch any cancer development as early as possible. Breast Cancer  Practice breast self-awareness.  This means understanding how your breasts normally  appear and feel.  It also means doing regular breast self-exams. Let your health care provider know about any changes, no matter how small.  If you are 19 or older, have a clinician do a breast exam (clinical breast exam  or CBE) every year. Depending on your age, family history, and medical history, it may be recommended that you also have a yearly breast X-ray (mammogram).  If you have a family history of breast cancer, talk with your health care provider about genetic screening.  If you are at high risk for breast cancer, talk with your health care provider about having an MRI and a mammogram every year.  Breast cancer (BRCA) gene test is recommended for women who have family members with BRCA-related cancers. Results of the assessment will determine the need for genetic counseling and BRCA1 and for BRCA2 testing. BRCA-related cancers include these types:  Breast. This occurs in males or females.  Ovarian.  Tubal. This may also be called fallopian tube cancer.  Cancer of the abdominal or pelvic lining (peritoneal cancer).  Prostate.  Pancreatic. Cervical, Uterine, and Ovarian Cancer Your health care provider may recommend that you be screened regularly for cancer of the pelvic organs. These include your ovaries, uterus, and vagina. This screening involves a pelvic exam, which includes checking for microscopic changes to the surface of your cervix (Pap test).  For women ages 21-65, health care providers may recommend a pelvic exam and a Pap test every three years. For women ages 47-65, they may recommend the Pap test and pelvic exam, combined with testing for human papilloma virus (HPV), every five years. Some types of HPV increase your risk of cervical cancer. Testing for HPV may also be done on women of any age who have unclear Pap test results.  Other health care providers may not recommend any screening for nonpregnant women who are considered low risk for pelvic cancer and have no symptoms. Ask your health care provider if a screening pelvic exam is right for you.  If you have had past treatment for cervical cancer or a condition that could lead to cancer, you need Pap tests and screening for cancer  for at least 20 years after your treatment. If Pap tests have been discontinued for you, your risk factors (such as having a new sexual partner) need to be reassessed to determine if you should start having screenings again. Some women have medical problems that increase the chance of getting cervical cancer. In these cases, your health care provider may recommend that you have screening and Pap tests more often.  If you have a family history of uterine cancer or ovarian cancer, talk with your health care provider about genetic screening.  If you have vaginal bleeding after reaching menopause, tell your health care provider.  There are currently no reliable tests available to screen for ovarian cancer. Lung Cancer Lung cancer screening is recommended for adults 40-34 years old who are at high risk for lung cancer because of a history of smoking. A yearly low-dose CT scan of the lungs is recommended if you:  Currently smoke.  Have a history of at least 30 pack-years of smoking and you currently smoke or have quit within the past 15 years. A pack-year is smoking an average of one pack of cigarettes per day for one year. Yearly screening should:  Continue until it has been 15 years since you quit.  Stop if you develop a health problem that would prevent you from having lung  cancer treatment. Colorectal Cancer  This type of cancer can be detected and can often be prevented.  Routine colorectal cancer screening usually begins at age 9 and continues through age 18.  If you have risk factors for colon cancer, your health care provider may recommend that you be screened at an earlier age.  If you have a family history of colorectal cancer, talk with your health care provider about genetic screening.  Your health care provider may also recommend using home test kits to check for hidden blood in your stool.  A small camera at the end of a tube can be used to examine your colon directly  (sigmoidoscopy or colonoscopy). This is done to check for the earliest forms of colorectal cancer.  Direct examination of the colon should be repeated every 5-10 years until age 48. However, if early forms of precancerous polyps or small growths are found or if you have a family history or genetic risk for colorectal cancer, you may need to be screened more often. Skin Cancer  Check your skin from head to toe regularly.  Monitor any moles. Be sure to tell your health care provider:  About any new moles or changes in moles, especially if there is a change in a mole's shape or color.  If you have a mole that is larger than the size of a pencil eraser.  If any of your family members has a history of skin cancer, especially at a young age, talk with your health care provider about genetic screening.  Always use sunscreen. Apply sunscreen liberally and repeatedly throughout the day.  Whenever you are outside, protect yourself by wearing long sleeves, pants, a wide-brimmed hat, and sunglasses. WHAT SHOULD I KNOW ABOUT OSTEOPOROSIS? Osteoporosis is a condition in which bone destruction happens more quickly than new bone creation. After menopause, you may be at an increased risk for osteoporosis. To help prevent osteoporosis or the bone fractures that can happen because of osteoporosis, the following is recommended:  If you are 41-59 years old, get at least 1,000 mg of calcium and at least 600 mg of vitamin D per day.  If you are older than age 80 but younger than age 1, get at least 1,200 mg of calcium and at least 600 mg of vitamin D per day.  If you are older than age 40, get at least 1,200 mg of calcium and at least 800 mg of vitamin D per day. Smoking and excessive alcohol intake increase the risk of osteoporosis. Eat foods that are rich in calcium and vitamin D, and do weight-bearing exercises several times each week as directed by your health care provider. WHAT SHOULD I KNOW ABOUT HOW  MENOPAUSE AFFECTS [place]? Depression may occur at any age, but it is more common as you become older. Common symptoms of depression include:  Low or sad mood.  Changes in sleep patterns.  Changes in appetite or eating patterns.  Feeling an overall lack of motivation or enjoyment of activities that you previously enjoyed.  Frequent crying spells. Talk with your health care provider if you think that you are experiencing depression. WHAT SHOULD I KNOW ABOUT IMMUNIZATIONS? It is important that you get and maintain your immunizations. These include:  Tetanus, diphtheria, and pertussis (Tdap) booster vaccine.  Influenza every year before the flu season begins.  Pneumonia vaccine.  Shingles vaccine. Your health care provider may also recommend other immunizations.   This information is not intended to replace advice given to  you by your health care provider. Make sure you discuss any questions you have with your health care provider.   Document Released: 11/23/2005 Document Revised: 10/22/2014 Document Reviewed: 06/03/2014 Elsevier Interactive Patient Education [employer].

## 2016-09-01 LAB — FECAL OCCULT BLOOD, IMMUNOCHEMICAL: Fecal Occult Bld: NEGATIVE

## 2016-10-16 ENCOUNTER — Ambulatory Visit: Payer: Medicare Other

## 2016-11-08 ENCOUNTER — Ambulatory Visit: Payer: Medicare Other

## 2016-12-18 ENCOUNTER — Ambulatory Visit: Payer: Medicare Other

## 2017-01-04 ENCOUNTER — Ambulatory Visit
Admission: RE | Admit: 2017-01-04 | Discharge: 2017-01-04 | Disposition: A | Discharge status: Home / Self Care | Payer: PPO | Source: Ambulatory Visit | Attending: Obstetrics and Gynecology | Admitting: Obstetrics and Gynecology

## 2017-01-04 DIAGNOSIS — Z1239 Encounter for other screening for malignant neoplasm of breast: Secondary | ICD-10-CM

## 2017-01-04 DIAGNOSIS — Z01419 Encounter for gynecological examination (general) (routine) without abnormal findings: Secondary | ICD-10-CM | POA: Insufficient documentation

## 2017-01-04 DIAGNOSIS — Z1231 Encounter for screening mammogram for malignant neoplasm of breast: Secondary | ICD-10-CM | POA: Diagnosis not present

## 2017-03-27 DIAGNOSIS — Z0000 Encounter for general adult medical examination without abnormal findings: Secondary | ICD-10-CM | POA: Diagnosis not present

## 2017-03-27 DIAGNOSIS — I10 Essential (primary) hypertension: Secondary | ICD-10-CM | POA: Diagnosis not present

## 2017-03-27 DIAGNOSIS — Z79899 Other long term (current) drug therapy: Secondary | ICD-10-CM | POA: Diagnosis not present

## 2017-03-27 DIAGNOSIS — I7300 Raynaud's syndrome without gangrene: Secondary | ICD-10-CM | POA: Diagnosis not present

## 2017-03-27 DIAGNOSIS — E010 Iodine-deficiency related diffuse (endemic) goiter: Secondary | ICD-10-CM | POA: Diagnosis not present

## 2017-03-27 DIAGNOSIS — E7800 Pure hypercholesterolemia, unspecified: Secondary | ICD-10-CM | POA: Diagnosis not present

## 2017-05-15 DIAGNOSIS — H2513 Age-related nuclear cataract, bilateral: Secondary | ICD-10-CM | POA: Diagnosis not present

## 2017-06-24 DIAGNOSIS — L821 Other seborrheic keratosis: Secondary | ICD-10-CM | POA: Diagnosis not present

## 2017-06-24 DIAGNOSIS — D1800 Hemangioma unspecified site: Secondary | ICD-10-CM | POA: Diagnosis not present

## 2017-06-24 DIAGNOSIS — L812 Freckles: Secondary | ICD-10-CM | POA: Diagnosis not present

## 2017-06-24 DIAGNOSIS — L578 Other skin changes due to chronic exposure to nonionizing radiation: Secondary | ICD-10-CM | POA: Diagnosis not present

## 2017-06-24 DIAGNOSIS — L820 Inflamed seborrheic keratosis: Secondary | ICD-10-CM | POA: Diagnosis not present

## 2017-06-24 DIAGNOSIS — D485 Neoplasm of uncertain behavior of skin: Secondary | ICD-10-CM | POA: Diagnosis not present

## 2017-06-24 DIAGNOSIS — Z85828 Personal history of other malignant neoplasm of skin: Secondary | ICD-10-CM | POA: Diagnosis not present

## 2017-08-01 DIAGNOSIS — M7989 Other specified soft tissue disorders: Secondary | ICD-10-CM | POA: Diagnosis not present

## 2017-08-01 DIAGNOSIS — M25572 Pain in left ankle and joints of left foot: Secondary | ICD-10-CM | POA: Diagnosis not present

## 2017-08-16 ENCOUNTER — Other Ambulatory Visit: Payer: Self-pay | Admitting: Obstetrics and Gynecology

## 2017-08-23 NOTE — Progress Notes (Signed)
Patient ID: Jessica Morrison, female   DOB: 08/20/1946, 71 y.o.   MRN: [id number] ANNUAL PREVENTATIVE CARE GYN  ENCOUNTER NOTE  Subjective:       Jessica Morrison is a 71 y.o. G37P1101 female here for a routine annual gynecologic exam.  Current complaints:   1. Hemorrhoids  2. Unsure is she needs to stay on evista.  Leahna is not experiencing any side effects of the medication.  She is due for a DEXA scan this year which is to be ordered by Dr. Doy Hutching    She is status post hysterectomy. She is not sexually active.  She is on Evista for osteoporosis.  She also is taking calcium and vitamin D.  Patient has to care for her husband who has had Parkinson's for the past 17 years.  Noorah is having appropriate grieving. Husband deceased 2017/07/28.  Family history of vulvar melanoma-mom  Gynecologic History No LMP recorded. Patient has had a hysterectomy. Contraception: status post hysterectomy Last Pap: unsure last pap. Results were: unsure Last mammogram: 12/2016 birad 1 Results were: normal Colonoscopy: 2016  Obstetric History OB History  Gravida Para Term Preterm AB Living  2 2 1 1   1   SAB TAB Ectopic Multiple Live Births          1    # Outcome Date GA Lbr Len/2nd Weight Sex Delivery Anes PTL Lv  2 Term 1976   6 lb (2.722 kg) F CS-LTranv   LIV  1 Preterm 1976        FD      Past Medical History:  Diagnosis Date  . Back pain   . Curvature of spine   . Endometriosis   . Endometriosis   . Fibrocystic disease of breast   . Fibroid   . GERD (gastroesophageal reflux disease)   . HLD (hyperlipidemia)   . Hyperlipemia   . Hypertension   . Kidney stone   . Nephrolithiasis   . Osteopenia   . Osteoporosis   . Pelvic pain in female   . Raynaud disease   . Thyromegaly     Past Surgical History:  Procedure Laterality Date  . ABDOMINAL HYSTERECTOMY    . BILATERAL OOPHORECTOMY  1999  . CESAREAN SECTION    . OOPHORECTOMY      Current Outpatient Medications on File Prior  to Visit  Medication Sig Dispense Refill  . aspirin EC 81 MG tablet Take 81 mg by mouth daily.    . calcium-vitamin D (CALCIUM 500/D) 500-200 MG-UNIT tablet Take by mouth.    . Cholecalciferol 2000 UNITS CAPS Take 1 capsule by mouth.    . raloxifene (EVISTA) 60 MG tablet TAKE 1 TABLET BY MOUTH ONCE A DAY 90 tablet 0  . rosuvastatin (CRESTOR) 5 MG tablet Take 5 mg by mouth daily.     No current facility-administered medications on file prior to visit.     Allergies  Allergen Reactions  . Codeine Nausea And Vomiting  . Neosporin Wound Cleanser [Benzalkonium Chloride] Other (See Comments)    Skin blisters  . Penicillins     Social History   Socioeconomic History  . Marital status: Married    Spouse name: Not on file  . Number of children: Not on file  . Years of education: Not on file  . Highest education level: Not on file  Social Needs  . Financial resource strain: Not on file  . Food insecurity - worry: Not on file  . Food insecurity -  inability: Not on file  . Transportation needs - medical: Not on file  . Transportation needs - non-medical: Not on file  Occupational History  . Not on file  Tobacco Use  . Smoking status: Never Smoker  Substance and Sexual Activity  . Alcohol use: No  . Drug use: No  . Sexual activity: Not Currently    Birth control/protection: Surgical  Other Topics Concern  . Not on file  Social History Narrative  . Not on file    Family History  Problem Relation Age of Onset  . Emphysema Father   . Cancer Neg Hx   . Heart disease Neg Hx   . Diabetes Neg Hx   . Breast cancer Neg Hx     The following portions of the patient's history were reviewed and updated as appropriate: allergies, current medications, past family history, past medical history, past social history, past surgical history and problem list.  Review of Systems Review of Systems  Constitutional: Negative.   HENT: Negative.   Eyes: Negative.   Respiratory: Negative.    Cardiovascular: Negative.   Gastrointestinal: Negative.   Genitourinary: Negative.   Musculoskeletal: Negative.   Skin: Negative.   Endo/Heme/Allergies: Negative.   Psychiatric/Behavioral:       Grieving from loss of spouse in September 2018    Objective:   BP 136/74   Pulse 85   Ht 4\' 11"  (1.499 m)   Wt 114 lb 9.6 oz (52 kg)   BMI 23.15 kg/m  CONSTITUTIONAL: Well-developed, well-nourished female in no acute distress.  PSYCHIATRIC: Normal mood and affect. Normal behavior. Normal judgment and thought content. [place]: Alert and oriented to person, place, and time. Normal muscle tone coordination. No cranial nerve deficit noted. HENT:  Normocephalic, atraumatic, External right and left ear normal. Oropharynx is clear and moist EYES: Conjunctivae and EOM are normal. No scleral icterus.  NECK: Normal range of motion, supple, no masses.  Normal thyroid.  SKIN: Skin is warm and dry. No rash noted. Not diaphoretic. No erythema. No pallor.Left inner thigh black macule 4 mm, unchanged CARDIOVASCULAR: Normal heart rate noted, regular rhythm, no murmur. RESPIRATORY: Clear to auscultation bilaterally. Effort and breath sounds normal, no problems with respiration noted. BREASTS: Symmetric in size. No masses, skin changes, nipple drainage, or lymphadenopathy.dense breasts with fibrocystic changes present ABDOMEN: Soft, normal bowel sounds, no distention noted.  No tenderness, rebound or guarding. Midline scar is healed; no hernias BLADDER: Normal PELVIC:  External Genitalia: Normal; no lesions  BUS: Normal  Vagina: Moderate to severe atrophy; single digit exam without palpable abnormalities  Cervix: surgically absent  Uterus: surgically absent  Adnexa: Normal  RV: External hemorrhoids palpable, non-thrombosed, No Rectal Masses and Normal Sphincter tone  MUSCULOSKELETAL: Normal range of motion. No tenderness.  No cyanosis, clubbing, or edema.  2+ distal pulses. LYMPHATIC: No Axillary,  Supraclavicular, or Inguinal Adenopathy.    Assessment:   Annual gynecologic examination 71 y.o. Contraception: status post hysterectomy Normal BMI Problem List Items Addressed This Visit    Osteoporosis   Vaginal atrophy   Family history of malignant melanoma    Other Visit Diagnoses    Well woman exam with routine gynecological exam    -  Primary   Screening for colon cancer       Screening for breast cancer        Family history of melanoma; left leg lesion stable Grief following passing of her husband in 06/2017, appropriate, does not appear to be in need of medication  Plan:  Pap: Not needed Mammogram: utd Stool Guaiac Testing:  ordered Labs: thur pcp Routine preventative health maintenance measures emphasized: Exercise/Diet/Weight control and Alcohol/Substance use risks Evista refilled 1 year. Continue with calcium and vitamin D supplementation Encouraged grief counseling through hospice with the loss of her spouse Encourage patient to talk with Dr. Doy Hutching about colonoscopy for episode of bleeding rectally; last colonoscopy 5 years ago demonstrated several polyps; patient states that she had a tortuous colon and she is not a good candidate for repeat colonoscopy at that time Return to [place], CMA  Brayton Mars, MD  Note: This dictation was prepared with Dragon dictation along with smaller phrase technology. Any transcriptional errors that result from this process are unintentional.

## 2017-08-27 ENCOUNTER — Ambulatory Visit (INDEPENDENT_AMBULATORY_CARE_PROVIDER_SITE_OTHER): Payer: PPO | Admitting: Obstetrics and Gynecology

## 2017-08-27 ENCOUNTER — Encounter: Payer: Self-pay | Admitting: Obstetrics and Gynecology

## 2017-08-27 ENCOUNTER — Encounter: Payer: Medicare Other | Admitting: Obstetrics and Gynecology

## 2017-08-27 VITALS — BP 136/74 | HR 85 | Ht 59.0 in | Wt 114.6 lb

## 2017-08-27 DIAGNOSIS — Z808 Family history of malignant neoplasm of other organs or systems: Secondary | ICD-10-CM | POA: Diagnosis not present

## 2017-08-27 DIAGNOSIS — M810 Age-related osteoporosis without current pathological fracture: Secondary | ICD-10-CM

## 2017-08-27 DIAGNOSIS — Z1211 Encounter for screening for malignant neoplasm of colon: Secondary | ICD-10-CM

## 2017-08-27 DIAGNOSIS — Z1231 Encounter for screening mammogram for malignant neoplasm of breast: Secondary | ICD-10-CM | POA: Diagnosis not present

## 2017-08-27 DIAGNOSIS — N952 Postmenopausal atrophic vaginitis: Secondary | ICD-10-CM

## 2017-08-27 DIAGNOSIS — Z01419 Encounter for gynecological examination (general) (routine) without abnormal findings: Secondary | ICD-10-CM | POA: Diagnosis not present

## 2017-08-27 DIAGNOSIS — Z1239 Encounter for other screening for malignant neoplasm of breast: Secondary | ICD-10-CM

## 2017-08-27 NOTE — Patient Instructions (Addendum)
1.  No Pap smear is needed 2.  Mammogram already obtained 3.  Stool guaiac card testing for colon cancer screening is ordered.  However, due to recent bleeding and associated hemorrhoids, follow-up with GI may be indicated as colonoscopy 5 years ago found several polyps.  I recommend consultation with Dr. Georgie Chard in internal medicine regarding further advice for this issue 4.  Screening labs are to be obtained through primary care 5.  Continue with healthy eating and exercise 6.  We will continue with the Evista 60 mg a day for osteoporosis management 7.  Continue with calcium and vitamin D supplementation daily 8.  Return in 1 year for annual exam   Health Maintenance for Postmenopausal Women Menopause is a normal process in which your reproductive ability comes to an end. This process happens gradually over a span of months to years, usually between the ages of 15 and 39. Menopause is complete when you have missed 12 consecutive menstrual periods. It is important to talk with your health care provider about some of the most common conditions that affect postmenopausal women, such as heart disease, cancer, and bone loss (osteoporosis). Adopting a healthy lifestyle and getting preventive care can help to promote your health and wellness. Those actions can also lower your chances of developing some of these common conditions. What should I know about menopause? During menopause, you may experience a number of symptoms, such as:  Moderate-to-severe hot flashes.  Night sweats.  Decrease in sex drive.  Mood swings.  Headaches.  Tiredness.  Irritability.  Memory problems.  Insomnia.  Choosing to treat or not to treat menopausal changes is an individual decision that you make with your health care provider. What should I know about hormone replacement therapy and supplements? Hormone therapy products are effective for treating symptoms that are associated with menopause, such as hot  flashes and night sweats. Hormone replacement carries certain risks, especially as you become older. If you are thinking about using estrogen or estrogen with progestin treatments, discuss the benefits and risks with your health care provider. What should I know about heart disease and stroke? Heart disease, heart attack, and stroke become more likely as you age. This may be due, in part, to the hormonal changes that your body experiences during menopause. These can affect how your body processes dietary fats, triglycerides, and cholesterol. Heart attack and stroke are both medical emergencies. There are many things that you can do to help prevent heart disease and stroke:  Have your blood pressure checked at least every 1-2 years. High blood pressure causes heart disease and increases the risk of stroke.  If you are 10-71 years old, ask your health care provider if you should take aspirin to prevent a heart attack or a stroke.  Do not use any tobacco products, including cigarettes, chewing tobacco, or electronic cigarettes. If you need help quitting, ask your health care provider.  It is important to eat a healthy diet and maintain a healthy weight. ? Be sure to include plenty of vegetables, fruits, low-fat dairy products, and lean protein. ? Avoid eating foods that are high in solid fats, added sugars, or salt (sodium).  Get regular exercise. This is one of the most important things that you can do for your health. ? Try to exercise for at least 150 minutes each week. The type of exercise that you do should increase your heart rate and make you sweat. This is known as moderate-intensity exercise. ? Try to do  strengthening exercises at least twice each week. Do these in addition to the moderate-intensity exercise.  Know your numbers.Ask your health care provider to check your cholesterol and your blood glucose. Continue to have your blood tested as directed by your health care provider.  What  should I know about cancer screening? There are several types of cancer. Take the following steps to reduce your risk and to catch any cancer development as early as possible. Breast Cancer  Practice breast self-awareness. ? This means understanding how your breasts normally appear and feel. ? It also means doing regular breast self-exams. Let your health care provider know about any changes, no matter how small.  If you are 99 or older, have a clinician do a breast exam (clinical breast exam or CBE) every year. Depending on your age, family history, and medical history, it may be recommended that you also have a yearly breast X-ray (mammogram).  If you have a family history of breast cancer, talk with your health care provider about genetic screening.  If you are at high risk for breast cancer, talk with your health care provider about having an MRI and a mammogram every year.  Breast cancer (BRCA) gene test is recommended for women who have family members with BRCA-related cancers. Results of the assessment will determine the need for genetic counseling and BRCA1 and for BRCA2 testing. BRCA-related cancers include these types: ? Breast. This occurs in males or females. ? Ovarian. ? Tubal. This may also be called fallopian tube cancer. ? Cancer of the abdominal or pelvic lining (peritoneal cancer). ? Prostate. ? Pancreatic.  Cervical, Uterine, and Ovarian Cancer Your health care provider may recommend that you be screened regularly for cancer of the pelvic organs. These include your ovaries, uterus, and vagina. This screening involves a pelvic exam, which includes checking for microscopic changes to the surface of your cervix (Pap test).  For women ages 21-65, health care providers may recommend a pelvic exam and a Pap test every three years. For women ages 22-65, they may recommend the Pap test and pelvic exam, combined with testing for human papilloma virus (HPV), every five years. Some  types of HPV increase your risk of cervical cancer. Testing for HPV may also be done on women of any age who have unclear Pap test results.  Other health care providers may not recommend any screening for nonpregnant women who are considered low risk for pelvic cancer and have no symptoms. Ask your health care provider if a screening pelvic exam is right for you.  If you have had past treatment for cervical cancer or a condition that could lead to cancer, you need Pap tests and screening for cancer for at least 20 years after your treatment. If Pap tests have been discontinued for you, your risk factors (such as having a new sexual partner) need to be reassessed to determine if you should start having screenings again. Some women have medical problems that increase the chance of getting cervical cancer. In these cases, your health care provider may recommend that you have screening and Pap tests more often.  If you have a family history of uterine cancer or ovarian cancer, talk with your health care provider about genetic screening.  If you have vaginal bleeding after reaching menopause, tell your health care provider.  There are currently no reliable tests available to screen for ovarian cancer.  Lung Cancer Lung cancer screening is recommended for adults 32-61 years old who are at high risk  for lung cancer because of a history of smoking. A yearly low-dose CT scan of the lungs is recommended if you:  Currently smoke.  Have a history of at least 30 pack-years of smoking and you currently smoke or have quit within the past 15 years. A pack-year is smoking an average of one pack of cigarettes per day for one year.  Yearly screening should:  Continue until it has been 15 years since you quit.  Stop if you develop a health problem that would prevent you from having lung cancer treatment.  Colorectal Cancer  This type of cancer can be detected and can often be prevented.  Routine colorectal  cancer screening usually begins at age 2 and continues through age 89.  If you have risk factors for colon cancer, your health care provider may recommend that you be screened at an earlier age.  If you have a family history of colorectal cancer, talk with your health care provider about genetic screening.  Your health care provider may also recommend using home test kits to check for hidden blood in your stool.  A small camera at the end of a tube can be used to examine your colon directly (sigmoidoscopy or colonoscopy). This is done to check for the earliest forms of colorectal cancer.  Direct examination of the colon should be repeated every 5-10 years until age 66. However, if early forms of precancerous polyps or small growths are found or if you have a family history or genetic risk for colorectal cancer, you may need to be screened more often.  Skin Cancer  Check your skin from head to toe regularly.  Monitor any moles. Be sure to tell your health care provider: ? About any new moles or changes in moles, especially if there is a change in a mole's shape or color. ? If you have a mole that is larger than the size of a pencil eraser.  If any of your family members has a history of skin cancer, especially at a young age, talk with your health care provider about genetic screening.  Always use sunscreen. Apply sunscreen liberally and repeatedly throughout the day.  Whenever you are outside, protect yourself by wearing long sleeves, pants, a wide-brimmed hat, and sunglasses.  What should I know about osteoporosis? Osteoporosis is a condition in which bone destruction happens more quickly than new bone creation. After menopause, you may be at an increased risk for osteoporosis. To help prevent osteoporosis or the bone fractures that can happen because of osteoporosis, the following is recommended:  If you are 20-76 years old, get at least 1,000 mg of calcium and at least 600 mg of  vitamin D per day.  If you are older than age 34 but younger than age 4, get at least 1,200 mg of calcium and at least 600 mg of vitamin D per day.  If you are older than age 4, get at least 1,200 mg of calcium and at least 800 mg of vitamin D per day.  Smoking and excessive alcohol intake increase the risk of osteoporosis. Eat foods that are rich in calcium and vitamin D, and do weight-bearing exercises several times each week as directed by your health care provider. What should I know about how menopause affects my mental health? Depression may occur at any age, but it is more common as you become older. Common symptoms of depression include:  Low or sad mood.  Changes in sleep patterns.  Changes in appetite or  eating patterns.  Feeling an overall lack of motivation or enjoyment of activities that you previously enjoyed.  Frequent crying spells.  Talk with your health care provider if you think that you are experiencing depression. What should I know about immunizations? It is important that you get and maintain your immunizations. These include:  Tetanus, diphtheria, and pertussis (Tdap) booster vaccine.  Influenza every year before the flu season begins.  Pneumonia vaccine.  Shingles vaccine.  Your health care provider may also recommend other immunizations. This information is not intended to replace advice given to you by your health care provider. Make sure you discuss any questions you have with your health care provider. Document Released: 11/23/2005 Document Revised: 04/20/2016 Document Reviewed: 07/05/2015 Elsevier Interactive Patient Education  2018 [employer].

## 2017-09-30 DIAGNOSIS — Z79899 Other long term (current) drug therapy: Secondary | ICD-10-CM | POA: Diagnosis not present

## 2017-09-30 DIAGNOSIS — I10 Essential (primary) hypertension: Secondary | ICD-10-CM | POA: Diagnosis not present

## 2017-09-30 DIAGNOSIS — N6019 Diffuse cystic mastopathy of unspecified breast: Secondary | ICD-10-CM | POA: Diagnosis not present

## 2017-09-30 DIAGNOSIS — E010 Iodine-deficiency related diffuse (endemic) goiter: Secondary | ICD-10-CM | POA: Diagnosis not present

## 2017-09-30 DIAGNOSIS — Z0000 Encounter for general adult medical examination without abnormal findings: Secondary | ICD-10-CM | POA: Diagnosis not present

## 2017-09-30 DIAGNOSIS — E7800 Pure hypercholesterolemia, unspecified: Secondary | ICD-10-CM | POA: Diagnosis not present

## 2017-09-30 DIAGNOSIS — Z1231 Encounter for screening mammogram for malignant neoplasm of breast: Secondary | ICD-10-CM | POA: Diagnosis not present

## 2017-10-17 DIAGNOSIS — E7800 Pure hypercholesterolemia, unspecified: Secondary | ICD-10-CM | POA: Diagnosis not present

## 2017-10-17 DIAGNOSIS — E010 Iodine-deficiency related diffuse (endemic) goiter: Secondary | ICD-10-CM | POA: Diagnosis not present

## 2017-10-17 DIAGNOSIS — Z79899 Other long term (current) drug therapy: Secondary | ICD-10-CM | POA: Diagnosis not present

## 2017-10-17 DIAGNOSIS — I10 Essential (primary) hypertension: Secondary | ICD-10-CM | POA: Diagnosis not present

## 2017-10-25 ENCOUNTER — Other Ambulatory Visit: Payer: Self-pay | Admitting: Internal Medicine

## 2017-10-25 DIAGNOSIS — Z1231 Encounter for screening mammogram for malignant neoplasm of breast: Secondary | ICD-10-CM

## 2017-12-12 ENCOUNTER — Other Ambulatory Visit: Payer: Self-pay | Admitting: Obstetrics and Gynecology

## 2017-12-12 DIAGNOSIS — M79672 Pain in left foot: Secondary | ICD-10-CM | POA: Diagnosis not present

## 2017-12-12 DIAGNOSIS — M79662 Pain in left lower leg: Secondary | ICD-10-CM | POA: Diagnosis not present

## 2017-12-13 ENCOUNTER — Ambulatory Visit
Admission: RE | Admit: 2017-12-13 | Discharge: 2017-12-13 | Disposition: A | Discharge status: Home / Self Care | Payer: PPO | Source: Ambulatory Visit | Attending: Internal Medicine | Admitting: Internal Medicine

## 2017-12-13 ENCOUNTER — Other Ambulatory Visit: Payer: Self-pay | Admitting: Internal Medicine

## 2017-12-13 DIAGNOSIS — M79662 Pain in left lower leg: Secondary | ICD-10-CM

## 2018-01-07 ENCOUNTER — Ambulatory Visit
Admission: RE | Admit: 2018-01-07 | Discharge: 2018-01-07 | Disposition: A | Discharge status: Home / Self Care | Payer: PPO | Source: Ambulatory Visit | Attending: Internal Medicine | Admitting: Internal Medicine

## 2018-01-07 DIAGNOSIS — Z1231 Encounter for screening mammogram for malignant neoplasm of breast: Secondary | ICD-10-CM | POA: Diagnosis not present

## 2018-04-08 DIAGNOSIS — E010 Iodine-deficiency related diffuse (endemic) goiter: Secondary | ICD-10-CM | POA: Diagnosis not present

## 2018-04-08 DIAGNOSIS — E7800 Pure hypercholesterolemia, unspecified: Secondary | ICD-10-CM | POA: Diagnosis not present

## 2018-04-08 DIAGNOSIS — R7309 Other abnormal glucose: Secondary | ICD-10-CM | POA: Diagnosis not present

## 2018-04-08 DIAGNOSIS — I10 Essential (primary) hypertension: Secondary | ICD-10-CM | POA: Diagnosis not present

## 2018-04-08 DIAGNOSIS — Z0000 Encounter for general adult medical examination without abnormal findings: Secondary | ICD-10-CM | POA: Diagnosis not present

## 2018-04-08 DIAGNOSIS — R413 Other amnesia: Secondary | ICD-10-CM | POA: Diagnosis not present

## 2018-04-08 DIAGNOSIS — M810 Age-related osteoporosis without current pathological fracture: Secondary | ICD-10-CM | POA: Diagnosis not present

## 2018-04-08 DIAGNOSIS — Z79899 Other long term (current) drug therapy: Secondary | ICD-10-CM | POA: Diagnosis not present

## 2018-05-22 DIAGNOSIS — R197 Diarrhea, unspecified: Secondary | ICD-10-CM | POA: Diagnosis not present

## 2018-05-22 DIAGNOSIS — K648 Other hemorrhoids: Secondary | ICD-10-CM | POA: Diagnosis not present

## 2018-05-22 DIAGNOSIS — R1032 Left lower quadrant pain: Secondary | ICD-10-CM | POA: Diagnosis not present

## 2018-05-22 DIAGNOSIS — R1031 Right lower quadrant pain: Secondary | ICD-10-CM | POA: Diagnosis not present

## 2018-06-12 ENCOUNTER — Other Ambulatory Visit: Payer: Self-pay | Admitting: Obstetrics and Gynecology

## 2018-06-26 DIAGNOSIS — M79662 Pain in left lower leg: Secondary | ICD-10-CM | POA: Diagnosis not present

## 2018-06-26 DIAGNOSIS — M25561 Pain in right knee: Secondary | ICD-10-CM | POA: Diagnosis not present

## 2018-06-26 DIAGNOSIS — M25551 Pain in right hip: Secondary | ICD-10-CM | POA: Diagnosis not present

## 2018-07-03 DIAGNOSIS — Z85828 Personal history of other malignant neoplasm of skin: Secondary | ICD-10-CM | POA: Diagnosis not present

## 2018-07-03 DIAGNOSIS — Z1283 Encounter for screening for malignant neoplasm of skin: Secondary | ICD-10-CM | POA: Diagnosis not present

## 2018-07-03 DIAGNOSIS — D229 Melanocytic nevi, unspecified: Secondary | ICD-10-CM | POA: Diagnosis not present

## 2018-07-03 DIAGNOSIS — L820 Inflamed seborrheic keratosis: Secondary | ICD-10-CM | POA: Diagnosis not present

## 2018-07-03 DIAGNOSIS — D485 Neoplasm of uncertain behavior of skin: Secondary | ICD-10-CM | POA: Diagnosis not present

## 2018-07-03 DIAGNOSIS — D692 Other nonthrombocytopenic purpura: Secondary | ICD-10-CM | POA: Diagnosis not present

## 2018-07-03 DIAGNOSIS — D1800 Hemangioma unspecified site: Secondary | ICD-10-CM | POA: Diagnosis not present

## 2018-07-03 DIAGNOSIS — D039 Melanoma in situ, unspecified: Secondary | ICD-10-CM

## 2018-07-03 DIAGNOSIS — L578 Other skin changes due to chronic exposure to nonionizing radiation: Secondary | ICD-10-CM | POA: Diagnosis not present

## 2018-07-03 DIAGNOSIS — L814 Other melanin hyperpigmentation: Secondary | ICD-10-CM | POA: Diagnosis not present

## 2018-07-03 DIAGNOSIS — D034 Melanoma in situ of scalp and neck: Secondary | ICD-10-CM | POA: Diagnosis not present

## 2018-07-03 HISTORY — DX: Melanoma in situ, unspecified: D03.9

## 2018-07-08 DIAGNOSIS — D034 Melanoma in situ of scalp and neck: Secondary | ICD-10-CM | POA: Diagnosis not present

## 2018-07-08 DIAGNOSIS — Z808 Family history of malignant neoplasm of other organs or systems: Secondary | ICD-10-CM | POA: Diagnosis not present

## 2018-07-17 DIAGNOSIS — H2513 Age-related nuclear cataract, bilateral: Secondary | ICD-10-CM | POA: Diagnosis not present

## 2018-07-29 DIAGNOSIS — D034 Melanoma in situ of scalp and neck: Secondary | ICD-10-CM | POA: Diagnosis not present

## 2018-07-29 HISTORY — PX: MELANOMA EXCISION: SHX5266

## 2018-08-05 DIAGNOSIS — D034 Melanoma in situ of scalp and neck: Secondary | ICD-10-CM | POA: Diagnosis not present

## 2018-08-27 NOTE — Progress Notes (Signed)
Patient ID: Jessica Morrison, female   DOB: 03-21-1946, 72 y.o.   MRN: [id number] ANNUAL PREVENTATIVE CARE GYN  ENCOUNTER NOTE  Subjective:       Jessica Morrison is a 72 y.o. G71P1101 female here for a routine annual gynecologic exam.  Current complaints:   1. Some rectal bleeding this morning associated with diarrhea.  She is due for next colonoscopy in 2 years.  Dr. Doy Hutching has been monitoring her rectal symptoms.    She is status post hysterectomy. She is not sexually active.  She is on Evista for osteoporosis.  She also is taking calcium and vitamin D.  She has lost 3 inches from her height due to osteoporosis. Next a DEXA scan is due this year-end of Nov 08, 2018  Husband deceased 09-Aug-2017.  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 Nehemiah Massed last month.  Gynecologic History No LMP recorded. Patient has had a hysterectomy. Contraception: status post hysterectomy Last Pap: unsure last pap. Results were: unsure Last mammogram: 12/2017 b irad1 Colonoscopy: 2016  Obstetric History OB History  Gravida Para Term Preterm AB Living  2 2 1 1   1   SAB TAB Ectopic Multiple Live Births          1    # Outcome Date GA Lbr Len/2nd Weight Sex Delivery Anes PTL Lv  2 Term 1976   6 lb (2.722 kg) F CS-LTranv   LIV  1 Preterm 1976        FD    Past Medical History:  Diagnosis Date  . Back pain   . Curvature of spine   . Endometriosis   . Endometriosis   . Fibrocystic disease of breast   . Fibroid   . GERD (gastroesophageal reflux disease)   . HLD (hyperlipidemia)   . Hyperlipemia   . Hypertension   . Kidney stone   . Nephrolithiasis   . Osteopenia   . Osteoporosis   . Pelvic pain in female   . Raynaud disease   . Thyromegaly     Past Surgical History:  Procedure Laterality Date  . ABDOMINAL HYSTERECTOMY    . BILATERAL OOPHORECTOMY  1999  . CESAREAN SECTION    . COLONOSCOPY WITH PROPOFOL N/A 05/09/2015   Procedure: COLONOSCOPY WITH PROPOFOL;  Surgeon: Josefine Class, MD;  Location: [facility] ENDOSCOPY;  Service: Endoscopy;  Laterality: N/A;  . OOPHORECTOMY      Current Outpatient Medications on File Prior to Visit  Medication Sig Dispense Refill  . aspirin EC 81 MG tablet Take 81 mg by mouth daily.    . calcium-vitamin D (CALCIUM 500/D) 500-200 MG-UNIT tablet Take by mouth.    . Cholecalciferol 2000 UNITS CAPS Take 1 capsule by mouth.    . raloxifene (EVISTA) 60 MG tablet TAKE 1 TABLET BY MOUTH ONCE DAILY 90 tablet 1  . rosuvastatin (CRESTOR) 5 MG tablet Take 5 mg by mouth daily.     No current facility-administered medications on file prior to visit.     Allergies  Allergen Reactions  . Codeine Nausea And Vomiting  . Neosporin Wound Cleanser [Benzalkonium Chloride] Other (See Comments)    Skin blisters  . Penicillins     Social History   Socioeconomic History  . Marital status: Widowed    Spouse name: Not on file  . Number of children: Not on file  . Years of education: Not on file  . Highest education level: Not on  file  Occupational History  . Not on file  Social Needs  . Financial resource strain: Not on file  . Food insecurity:    Worry: Not on file    Inability: Not on file  . Transportation needs:    Medical: Not on file    Non-medical: Not on file  Tobacco Use  . Smoking status: Never Smoker  . Smokeless tobacco: Never Used  Substance and Sexual Activity  . Alcohol use: No  . Drug use: No  . Sexual activity: Not Currently    Birth control/protection: Surgical  Lifestyle  . Physical activity:    Days per week: 2 days    Minutes per session: 60 min  . Stress: To some extent  Relationships  . Social connections:    Talks on phone: Not on file    Gets together: Not on file    Attends religious service: Not on file    Active member of club or organization: Not on file    Attends meetings of clubs or organizations: Not on file    Relationship status: Not  on file  . Intimate partner violence:    Fear of current or ex partner: Not on file    Emotionally abused: Not on file    Physically abused: Not on file    Forced sexual activity: Not on file  Other Topics Concern  . Not on file  Social History Narrative  . Not on file    Family History  Problem Relation Age of Onset  . Emphysema Father   . Cancer Neg Hx   . Heart disease Neg Hx   . Diabetes Neg Hx   . Breast cancer Neg Hx     The following portions of the patient's history were reviewed and updated as appropriate: allergies, current medications, past family history, past medical history, past social history, past surgical history and problem list.  Review of Systems Review of Systems  Respiratory: Negative.   Cardiovascular: Negative.   Gastrointestinal: Positive for diarrhea.       Slight bleeding today associated with her diarrhea  Genitourinary: Negative.   Musculoskeletal: Negative.   Skin: Negative.   Neurological: Negative.   Psychiatric/Behavioral: Negative.   All other systems reviewed and are negative.   Objective:   BP (!) 156/79   Ht 4\' 11"  (1.499 m)   Wt 116 lb (52.6 kg)   BMI 23.43 kg/m  CONSTITUTIONAL: Well-developed, well-nourished female in no acute distress.  PSYCHIATRIC: Normal mood and affect. Normal behavior. Normal judgment and thought content. [place]: Alert and oriented to person, place, and time. Normal muscle tone coordination. No cranial nerve deficit noted. HENT:  Normocephalic, atraumatic, External right and left ear normal.  EYES: Conjunctivae and EOM are normal. No scleral icterus.  NECK: Normal range of motion, supple, no masses.  Normal thyroid.  SKIN: Skin is warm and dry. No rash noted. Not diaphoretic. No erythema. No pallor. CARDIOVASCULAR: Normal heart rate noted, regular rhythm, no murmur. RESPIRATORY: Clear to auscultation bilaterally. Effort and breath sounds normal, no problems with respiration noted. BREASTS: Symmetric  in size. No masses, skin changes, nipple drainage, or lymphadenopathy.dense breasts with fibrocystic changes present ABDOMEN: Soft, normal bowel sounds, no distention noted.  No tenderness, rebound or guarding. Midline scar is healed; no hernias BLADDER: Normal PELVIC:  External Genitalia: Normal; no lesions  BUS: Normal  Vagina: Moderate to severe atrophy; single digit exam without palpable abnormalities  Cervix: surgically absent  Uterus: surgically absent  Adnexa: Normal  RV: External hemorrhoids palpable, non-thrombosed, No Rectal Masses and Normal Sphincter tone  MUSCULOSKELETAL: Normal range of motion. No tenderness.  No cyanosis, clubbing, or edema.  2+ distal pulses. LYMPHATIC: No Axillary, Supraclavicular, or Inguinal Adenopathy.    Assessment:   Annual gynecologic examination 72 y.o. Contraception: status post hysterectomy Normal BMI 23.4 Family history of melanoma; recent melanoma excised from right neck Vaginal atrophy, moderate to severe, asymptomatic  Plan:  Pap: Not needed Mammogram: pcp Stool Guaiac Testing:  ordered Labs: thur pcp Routine preventative health maintenance measures emphasized: Exercise/Diet/Weight control and Alcohol/Substance use risks Evista refilled 1 year. Continue with calcium and vitamin D supplementation  Encourage patient to talk with Dr. Doy Hutching about ongoing monitoring his 2 weeks for episode of bleeding rectally; last colonoscopy 5 years ago demonstrated several polyps; patient states that she had a tortuous colon and she is not a good candidate for repeat colonoscopy at that time Return to Clinic - [place], CMA  Brayton Mars, MD  Shaune Pascal CMA acting as scribe for Dr. Charlena Cross. I have reviewed, updated, and concur with data entered by scribe.   Note: This dictation was prepared with Dragon dictation along with smaller phrase technology. Any transcriptional errors that result from this process are  unintentional.

## 2018-08-28 ENCOUNTER — Ambulatory Visit (INDEPENDENT_AMBULATORY_CARE_PROVIDER_SITE_OTHER): Payer: PPO | Admitting: Obstetrics and Gynecology

## 2018-08-28 ENCOUNTER — Encounter: Payer: Self-pay | Admitting: Obstetrics and Gynecology

## 2018-08-28 VITALS — BP 156/79 | Ht 59.0 in | Wt 116.0 lb

## 2018-08-28 DIAGNOSIS — Z01419 Encounter for gynecological examination (general) (routine) without abnormal findings: Secondary | ICD-10-CM | POA: Diagnosis not present

## 2018-08-28 DIAGNOSIS — N952 Postmenopausal atrophic vaginitis: Secondary | ICD-10-CM

## 2018-08-28 DIAGNOSIS — M810 Age-related osteoporosis without current pathological fracture: Secondary | ICD-10-CM

## 2018-08-28 DIAGNOSIS — Z808 Family history of malignant neoplasm of other organs or systems: Secondary | ICD-10-CM

## 2018-08-28 MED ORDER — RALOXIFENE HCL 60 MG PO TABS: 60.0000 mg | ORAL_TABLET | Freq: Every day | ORAL | 1 refills | Status: DC

## 2018-08-28 NOTE — Patient Instructions (Addendum)
1.  Pap smear is not done.  No further Pap smears are needed. 2.  Mammograms are obtained through primary care.  DEXA scan will be obtained through primary care for monitoring of osteoporosis 3.  Stool guaiac card testing is ordered for colon cancer screening. 4.  Screening labs are obtained through primary care. 5.  Evista is refilled for 1 year. 6.  Continue with calcium and vitamin D supplementation twice a day. 7.  Continue with healthy eating and exercise.  Consider yoga for helping to maintain flexibility 8.  Return in 1 year for annual gynecologic physical-Dr. [place] Maintenance for Postmenopausal Women Menopause is a normal process in which your reproductive ability comes to an end. This process happens gradually over a span of months to years, usually between the ages of 22 and 30. Menopause is complete when you have missed 12 consecutive menstrual periods. It is important to talk with your health care provider about some of the most common conditions that affect postmenopausal women, such as heart disease, cancer, and bone loss (osteoporosis). Adopting a healthy lifestyle and getting preventive care can help to promote your health and wellness. Those actions can also lower your chances of developing some of these common conditions. What should I know about menopause? During menopause, you may experience a number of symptoms, such as:  Moderate-to-severe hot flashes.  Night sweats.  Decrease in sex drive.  Mood swings.  Headaches.  Tiredness.  Irritability.  Memory problems.  Insomnia.  Choosing to treat or not to treat menopausal changes is an individual decision that you make with your health care provider. What should I know about hormone replacement therapy and supplements? Hormone therapy products are effective for treating symptoms that are associated with menopause, such as hot flashes and night sweats. Hormone replacement carries certain risks, especially  as you become older. If you are thinking about using estrogen or estrogen with progestin treatments, discuss the benefits and risks with your health care provider. What should I know about heart disease and stroke? Heart disease, heart attack, and stroke become more likely as you age. This may be due, in part, to the hormonal changes that your body experiences during menopause. These can affect how your body processes dietary fats, triglycerides, and cholesterol. Heart attack and stroke are both medical emergencies. There are many things that you can do to help prevent heart disease and stroke:  Have your blood pressure checked at least every 1-2 years. High blood pressure causes heart disease and increases the risk of stroke.  If you are 35-91 years old, ask your health care provider if you should take aspirin to prevent a heart attack or a stroke.  Do not use any tobacco products, including cigarettes, chewing tobacco, or electronic cigarettes. If you need help quitting, ask your health care provider.  It is important to eat a healthy diet and maintain a healthy weight. ? Be sure to include plenty of vegetables, fruits, low-fat dairy products, and lean protein. ? Avoid eating foods that are high in solid fats, added sugars, or salt (sodium).  Get regular exercise. This is one of the most important things that you can do for your health. ? Try to exercise for at least 150 minutes each week. The type of exercise that you do should increase your heart rate and make you sweat. This is known as moderate-intensity exercise. ? Try to do strengthening exercises at least twice each week. Do these in addition to the moderate-intensity  exercise.  Know your numbers.Ask your health care provider to check your cholesterol and your blood glucose. Continue to have your blood tested as directed by your health care provider.  What should I know about cancer screening? There are several types of cancer. Take  the following steps to reduce your risk and to catch any cancer development as early as possible. Breast Cancer  Practice breast self-awareness. ? This means understanding how your breasts normally appear and feel. ? It also means doing regular breast self-exams. Let your health care provider know about any changes, no matter how small.  If you are 64 or older, have a clinician do a breast exam (clinical breast exam or CBE) every year. Depending on your age, family history, and medical history, it may be recommended that you also have a yearly breast X-ray (mammogram).  If you have a family history of breast cancer, talk with your health care provider about genetic screening.  If you are at high risk for breast cancer, talk with your health care provider about having an MRI and a mammogram every year.  Breast cancer (BRCA) gene test is recommended for women who have family members with BRCA-related cancers. Results of the assessment will determine the need for genetic counseling and BRCA1 and for BRCA2 testing. BRCA-related cancers include these types: ? Breast. This occurs in males or females. ? Ovarian. ? Tubal. This may also be called fallopian tube cancer. ? Cancer of the abdominal or pelvic lining (peritoneal cancer). ? Prostate. ? Pancreatic.  Cervical, Uterine, and Ovarian Cancer Your health care provider may recommend that you be screened regularly for cancer of the pelvic organs. These include your ovaries, uterus, and vagina. This screening involves a pelvic exam, which includes checking for microscopic changes to the surface of your cervix (Pap test).  For women ages 21-65, health care providers may recommend a pelvic exam and a Pap test every three years. For women ages 72-65, they may recommend the Pap test and pelvic exam, combined with testing for human papilloma virus (HPV), every five years. Some types of HPV increase your risk of cervical cancer. Testing for HPV may also be  done on women of any age who have unclear Pap test results.  Other health care providers may not recommend any screening for nonpregnant women who are considered low risk for pelvic cancer and have no symptoms. Ask your health care provider if a screening pelvic exam is right for you.  If you have had past treatment for cervical cancer or a condition that could lead to cancer, you need Pap tests and screening for cancer for at least 20 years after your treatment. If Pap tests have been discontinued for you, your risk factors (such as having a new sexual partner) need to be reassessed to determine if you should start having screenings again. Some women have medical problems that increase the chance of getting cervical cancer. In these cases, your health care provider may recommend that you have screening and Pap tests more often.  If you have a family history of uterine cancer or ovarian cancer, talk with your health care provider about genetic screening.  If you have vaginal bleeding after reaching menopause, tell your health care provider.  There are currently no reliable tests available to screen for ovarian cancer.  Lung Cancer Lung cancer screening is recommended for adults 1-107 years old who are at high risk for lung cancer because of a history of smoking. A yearly low-dose CT scan  of the lungs is recommended if you:  Currently smoke.  Have a history of at least 30 pack-years of smoking and you currently smoke or have quit within the past 15 years. A pack-year is smoking an average of one pack of cigarettes per day for one year.  Yearly screening should:  Continue until it has been 15 years since you quit.  Stop if you develop a health problem that would prevent you from having lung cancer treatment.  Colorectal Cancer  This type of cancer can be detected and can often be prevented.  Routine colorectal cancer screening usually begins at age 1 and continues through age 8.  If  you have risk factors for colon cancer, your health care provider may recommend that you be screened at an earlier age.  If you have a family history of colorectal cancer, talk with your health care provider about genetic screening.  Your health care provider may also recommend using home test kits to check for hidden blood in your stool.  A small camera at the end of a tube can be used to examine your colon directly (sigmoidoscopy or colonoscopy). This is done to check for the earliest forms of colorectal cancer.  Direct examination of the colon should be repeated every 5-10 years until age 68. However, if early forms of precancerous polyps or small growths are found or if you have a family history or genetic risk for colorectal cancer, you may need to be screened more often.  Skin Cancer  Check your skin from head to toe regularly.  Monitor any moles. Be sure to tell your health care provider: ? About any new moles or changes in moles, especially if there is a change in a mole's shape or color. ? If you have a mole that is larger than the size of a pencil eraser.  If any of your family members has a history of skin cancer, especially at a young age, talk with your health care provider about genetic screening.  Always use sunscreen. Apply sunscreen liberally and repeatedly throughout the day.  Whenever you are outside, protect yourself by wearing long sleeves, pants, a wide-brimmed hat, and sunglasses.  What should I know about osteoporosis? Osteoporosis is a condition in which bone destruction happens more quickly than new bone creation. After menopause, you may be at an increased risk for osteoporosis. To help prevent osteoporosis or the bone fractures that can happen because of osteoporosis, the following is recommended:  If you are 44-69 years old, get at least 1,000 mg of calcium and at least 600 mg of vitamin D per day.  If you are older than age 64 but younger than age 52, get at  least 1,200 mg of calcium and at least 600 mg of vitamin D per day.  If you are older than age 36, get at least 1,200 mg of calcium and at least 800 mg of vitamin D per day.  Smoking and excessive alcohol intake increase the risk of osteoporosis. Eat foods that are rich in calcium and vitamin D, and do weight-bearing exercises several times each week as directed by your health care provider. What should I know about how menopause affects my mental health? Depression may occur at any age, but it is more common as you become older. Common symptoms of depression include:  Low or sad mood.  Changes in sleep patterns.  Changes in appetite or eating patterns.  Feeling an overall lack of motivation or enjoyment of activities that  you previously enjoyed.  Frequent crying spells.  Talk with your health care provider if you think that you are experiencing depression. What should I know about immunizations? It is important that you get and maintain your immunizations. These include:  Tetanus, diphtheria, and pertussis (Tdap) booster vaccine.  Influenza every year before the flu season begins.  Pneumonia vaccine.  Shingles vaccine.  Your health care provider may also recommend other immunizations. This information is not intended to replace advice given to you by your health care provider. Make sure you discuss any questions you have with your health care provider. Document Released: 11/23/2005 Document Revised: 04/20/2016 Document Reviewed: 07/05/2015 Elsevier Interactive Patient Education  2018 [employer].

## 2018-10-14 ENCOUNTER — Other Ambulatory Visit: Payer: Self-pay | Admitting: Internal Medicine

## 2018-10-14 DIAGNOSIS — M810 Age-related osteoporosis without current pathological fracture: Secondary | ICD-10-CM | POA: Diagnosis not present

## 2018-10-14 DIAGNOSIS — E7800 Pure hypercholesterolemia, unspecified: Secondary | ICD-10-CM | POA: Diagnosis not present

## 2018-10-14 DIAGNOSIS — Z0000 Encounter for general adult medical examination without abnormal findings: Secondary | ICD-10-CM | POA: Diagnosis not present

## 2018-10-14 DIAGNOSIS — E538 Deficiency of other specified B group vitamins: Secondary | ICD-10-CM | POA: Diagnosis not present

## 2018-10-14 DIAGNOSIS — Z1231 Encounter for screening mammogram for malignant neoplasm of breast: Secondary | ICD-10-CM

## 2018-10-14 DIAGNOSIS — I10 Essential (primary) hypertension: Secondary | ICD-10-CM | POA: Diagnosis not present

## 2018-10-14 DIAGNOSIS — E010 Iodine-deficiency related diffuse (endemic) goiter: Secondary | ICD-10-CM | POA: Diagnosis not present

## 2018-10-14 DIAGNOSIS — R7309 Other abnormal glucose: Secondary | ICD-10-CM | POA: Diagnosis not present

## 2018-10-14 DIAGNOSIS — Z1239 Encounter for other screening for malignant neoplasm of breast: Secondary | ICD-10-CM | POA: Diagnosis not present

## 2018-10-14 DIAGNOSIS — Z79899 Other long term (current) drug therapy: Secondary | ICD-10-CM | POA: Diagnosis not present

## 2018-10-29 DIAGNOSIS — M810 Age-related osteoporosis without current pathological fracture: Secondary | ICD-10-CM | POA: Diagnosis not present

## 2018-11-06 DIAGNOSIS — Z85820 Personal history of malignant melanoma of skin: Secondary | ICD-10-CM | POA: Diagnosis not present

## 2018-11-06 DIAGNOSIS — D485 Neoplasm of uncertain behavior of skin: Secondary | ICD-10-CM | POA: Diagnosis not present

## 2018-11-06 DIAGNOSIS — L812 Freckles: Secondary | ICD-10-CM | POA: Diagnosis not present

## 2018-11-06 DIAGNOSIS — D1800 Hemangioma unspecified site: Secondary | ICD-10-CM | POA: Diagnosis not present

## 2018-11-06 DIAGNOSIS — L578 Other skin changes due to chronic exposure to nonionizing radiation: Secondary | ICD-10-CM | POA: Diagnosis not present

## 2018-11-06 DIAGNOSIS — D229 Melanocytic nevi, unspecified: Secondary | ICD-10-CM | POA: Diagnosis not present

## 2018-11-06 DIAGNOSIS — L570 Actinic keratosis: Secondary | ICD-10-CM | POA: Diagnosis not present

## 2018-11-06 DIAGNOSIS — L821 Other seborrheic keratosis: Secondary | ICD-10-CM | POA: Diagnosis not present

## 2018-11-06 DIAGNOSIS — Z1283 Encounter for screening for malignant neoplasm of skin: Secondary | ICD-10-CM | POA: Diagnosis not present

## 2018-11-06 DIAGNOSIS — L820 Inflamed seborrheic keratosis: Secondary | ICD-10-CM | POA: Diagnosis not present

## 2018-11-06 DIAGNOSIS — D692 Other nonthrombocytopenic purpura: Secondary | ICD-10-CM | POA: Diagnosis not present

## 2018-11-06 DIAGNOSIS — Z85828 Personal history of other malignant neoplasm of skin: Secondary | ICD-10-CM | POA: Diagnosis not present

## 2019-03-26 DIAGNOSIS — L821 Other seborrheic keratosis: Secondary | ICD-10-CM | POA: Diagnosis not present

## 2019-03-26 DIAGNOSIS — L812 Freckles: Secondary | ICD-10-CM | POA: Diagnosis not present

## 2019-03-26 DIAGNOSIS — D225 Melanocytic nevi of trunk: Secondary | ICD-10-CM | POA: Diagnosis not present

## 2019-03-26 DIAGNOSIS — Z1283 Encounter for screening for malignant neoplasm of skin: Secondary | ICD-10-CM | POA: Diagnosis not present

## 2019-03-26 DIAGNOSIS — D229 Melanocytic nevi, unspecified: Secondary | ICD-10-CM | POA: Diagnosis not present

## 2019-03-26 DIAGNOSIS — D1800 Hemangioma unspecified site: Secondary | ICD-10-CM | POA: Diagnosis not present

## 2019-03-26 DIAGNOSIS — D485 Neoplasm of uncertain behavior of skin: Secondary | ICD-10-CM | POA: Diagnosis not present

## 2019-03-26 DIAGNOSIS — D2230 Melanocytic nevi of unspecified part of face: Secondary | ICD-10-CM | POA: Diagnosis not present

## 2019-03-26 DIAGNOSIS — Z85820 Personal history of malignant melanoma of skin: Secondary | ICD-10-CM | POA: Diagnosis not present

## 2019-03-26 DIAGNOSIS — D224 Melanocytic nevi of scalp and neck: Secondary | ICD-10-CM | POA: Diagnosis not present

## 2019-03-26 DIAGNOSIS — L578 Other skin changes due to chronic exposure to nonionizing radiation: Secondary | ICD-10-CM | POA: Diagnosis not present

## 2019-03-26 DIAGNOSIS — Z85828 Personal history of other malignant neoplasm of skin: Secondary | ICD-10-CM | POA: Diagnosis not present

## 2019-04-14 DIAGNOSIS — E010 Iodine-deficiency related diffuse (endemic) goiter: Secondary | ICD-10-CM | POA: Diagnosis not present

## 2019-04-14 DIAGNOSIS — I10 Essential (primary) hypertension: Secondary | ICD-10-CM | POA: Diagnosis not present

## 2019-04-14 DIAGNOSIS — E7800 Pure hypercholesterolemia, unspecified: Secondary | ICD-10-CM | POA: Diagnosis not present

## 2019-04-14 DIAGNOSIS — Z79899 Other long term (current) drug therapy: Secondary | ICD-10-CM | POA: Diagnosis not present

## 2019-04-14 DIAGNOSIS — R7309 Other abnormal glucose: Secondary | ICD-10-CM | POA: Diagnosis not present

## 2019-04-14 DIAGNOSIS — Z0000 Encounter for general adult medical examination without abnormal findings: Secondary | ICD-10-CM | POA: Diagnosis not present

## 2019-04-21 ENCOUNTER — Ambulatory Visit
Admission: RE | Admit: 2019-04-21 | Discharge: 2019-04-21 | Disposition: A | Discharge status: Home / Self Care | Payer: PPO | Source: Ambulatory Visit | Attending: Internal Medicine | Admitting: Internal Medicine

## 2019-04-21 ENCOUNTER — Other Ambulatory Visit: Payer: Self-pay

## 2019-04-21 DIAGNOSIS — Z1231 Encounter for screening mammogram for malignant neoplasm of breast: Secondary | ICD-10-CM | POA: Insufficient documentation

## 2019-04-21 HISTORY — DX: Malignant (primary) neoplasm, unspecified: C80.1

## 2019-05-27 ENCOUNTER — Other Ambulatory Visit: Payer: Self-pay | Admitting: Obstetrics and Gynecology

## 2019-06-25 DIAGNOSIS — Z1283 Encounter for screening for malignant neoplasm of skin: Secondary | ICD-10-CM | POA: Diagnosis not present

## 2019-06-25 DIAGNOSIS — L578 Other skin changes due to chronic exposure to nonionizing radiation: Secondary | ICD-10-CM | POA: Diagnosis not present

## 2019-06-25 DIAGNOSIS — Z86018 Personal history of other benign neoplasm: Secondary | ICD-10-CM | POA: Diagnosis not present

## 2019-06-25 DIAGNOSIS — Z85820 Personal history of malignant melanoma of skin: Secondary | ICD-10-CM | POA: Diagnosis not present

## 2019-09-01 ENCOUNTER — Other Ambulatory Visit: Payer: Self-pay

## 2019-09-01 ENCOUNTER — Ambulatory Visit (INDEPENDENT_AMBULATORY_CARE_PROVIDER_SITE_OTHER): Payer: PPO | Admitting: Obstetrics and Gynecology

## 2019-09-01 ENCOUNTER — Encounter: Payer: Self-pay | Admitting: Obstetrics and Gynecology

## 2019-09-01 VITALS — BP 148/79 | HR 83 | Ht 59.0 in | Wt 117.4 lb

## 2019-09-01 DIAGNOSIS — Z01419 Encounter for gynecological examination (general) (routine) without abnormal findings: Secondary | ICD-10-CM

## 2019-09-01 NOTE — Progress Notes (Signed)
HPI:      [name] is a 73 y.o. G2P1101 who LMP was No LMP recorded. Patient has had a hysterectomy.  Subjective:   She presents today for her annual examination.  Her only complaint at this time is that she has hemorrhoids that she is putting steroid cream on but she continues to experience occasional rectal bleeding from them.  She describes the bleeding as bright red.  She says it is worse when she walks.  Patient has elevated cholesterol and takes Crestor at this time. She is using calcium and vitamin D regularly.  She is also taking Osphena for osteoporosis.    Hx: The following portions of the patient's history were reviewed and updated as appropriate:             She  has a past medical history of Back pain, Cancer ([place]), Curvature of spine, Endometriosis, Endometriosis, Fibrocystic disease of breast, Fibroid, GERD (gastroesophageal reflux disease), HLD (hyperlipidemia), Hyperlipemia, Hypertension, Kidney stone, Nephrolithiasis, Osteopenia, Osteoporosis, Pelvic pain in female, Raynaud disease, and Thyromegaly. She does not have any pertinent problems on file. She  has a past surgical history that includes Abdominal hysterectomy; Oophorectomy; Colonoscopy with propofol (N/A, 05/09/2015); Cesarean section; and Bilateral oophorectomy (1999). Her family history includes Emphysema in her father. She  reports that she has never smoked. She has never used smokeless tobacco. She reports that she does not drink alcohol or use drugs. She has a current medication list which includes the following prescription(s): aspirin ec, calcium-vitamin d, cholecalciferol, raloxifene, and rosuvastatin. She is allergic to codeine; neosporin wound cleanser [benzalkonium chloride]; and penicillins.       Review of Systems:  Review of Systems  Constitutional: Denied constitutional symptoms, night sweats, recent illness, fatigue, fever, insomnia and weight loss.  Eyes: Denied eye symptoms, eye pain,  photophobia, vision change and visual disturbance.  Ears/Nose/Throat/Neck: Denied ear, nose, throat or neck symptoms, hearing loss, nasal discharge, sinus congestion and sore throat.  Cardiovascular: Denied cardiovascular symptoms, arrhythmia, chest pain/pressure, edema, exercise intolerance, orthopnea and palpitations.  Respiratory: Denied pulmonary symptoms, asthma, pleuritic pain, productive sputum, cough, dyspnea and wheezing.  Gastrointestinal: Denied, gastro-esophageal reflux, melena, nausea and vomiting.  Genitourinary: See HPI for additional information.  Musculoskeletal: Denied musculoskeletal symptoms, stiffness, swelling, muscle weakness and myalgia.  Dermatologic: Denied dermatology symptoms, rash and scar.  Neurologic: Denied neurology symptoms, dizziness, headache, neck pain and syncope.  Psychiatric: Denied psychiatric symptoms, anxiety and depression.  Endocrine: Denied endocrine symptoms including hot flashes and night sweats.   Meds:   Current Outpatient Medications on File Prior to Visit  Medication Sig Dispense Refill  . aspirin EC 81 MG tablet Take 81 mg by mouth daily.    . calcium-vitamin D (CALCIUM 500/D) 500-200 MG-UNIT tablet Take by mouth.    . Cholecalciferol 2000 UNITS CAPS Take 1 capsule by mouth.    . raloxifene (EVISTA) 60 MG tablet TAKE 1 TABLET BY MOUTH ONCE A DAY 90 tablet 1  . rosuvastatin (CRESTOR) 5 MG tablet Take 5 mg by mouth daily.     No current facility-administered medications on file prior to visit.     Objective:     Vitals:   09/01/19 1402  BP: (!) 148/79  Pulse: 83              Physical examination General NAD, Conversant  HEENT Atraumatic; Op clear with mmm.  Normo-cephalic. Pupils reactive. Anicteric sclerae  Thyroid/Neck Smooth without nodularity or enlargement. Normal ROM.  Neck Supple.  Skin No rashes, lesions or ulceration. Normal palpated skin turgor. No nodularity.  Breasts: No masses or discharge.  Symmetric.  No axillary  adenopathy.  Lungs: Clear to auscultation.No rales or wheezes. Normal Respiratory effort, no retractions.  Heart: NSR.  No murmurs or rubs appreciated. No periferal edema  Abdomen: Soft.  Non-tender.  No masses.  No HSM. No hernia  Extremities: Moves all appropriately.  Normal ROM for age. No lymphadenopathy.  Neuro: Oriented to PPT.  Normal mood. Normal affect.     Pelvic:   Vulva: Normal appearance.  No lesions.   Vagina:  Very small menopausal introitus  Support: Normal pelvic support.  Urethra No masses tenderness or scarring.  Meatus Normal size without lesions or prolapse.  Cervix: Surgically absent   Anus: Normal exam.  No lesions.  Hemorrhoidal tag noted but no evidence of any inflamed hemorrhoid.  Perineum: Normal exam.  No lesions.        Bimanual   Uterus: Surgically absent   Adnexae: No masses.  Non-tender to palpation.  Cul-de-sac: Negative for abnormality.     Assessment:    G2P1101 Patient Active Problem List   Diagnosis Date Noted  . Skin macule 08/23/2016  . Family history of malignant melanoma 08/23/2016  . Endometriosis 08/16/2015  . Vaginal atrophy 08/16/2015  . 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 Calculus of kidney 04/22/2014  . BP (high blood pressure) 03/16/2014  . HLD (hyperlipidemia) 03/16/2014  . Arthritis, degenerative 03/16/2014  . Osteoporosis 03/16/2014  . Raynaud's syndrome without gangrene 03/16/2014  . Big thyroid 03/16/2014     1. Well woman exam with routine gynecological exam     Patient complains of occasional bright red rectal bleeding-internal hemorrhoid.   Plan:            1.  Basic Screening Recommendations The basic screening recommendations for asymptomatic women were discussed with the patient during her visit.  The age-appropriate recommendations were discussed with her and the rational for the tests reviewed.  When I am informed by the patient that another primary care physician has previously obtained the age-appropriate  tests and they are up-to-date, only outstanding tests are ordered and referrals given as necessary.  Abnormal results of tests will be discussed with her when all of her results are completed.  Routine preventative health maintenance measures emphasized: Exercise/Diet/Weight control, Tobacco Warnings, Alcohol/Substance use risks and Stress Management   2.   We have discussed hemorrhoids in detail, use of Preparation H, steroid cream, and keeping her stool soft.  I recommended follow-up for her colonoscopy and possibly even a follow-up with a colorectal surgeon for examination of internal hemorrhoids.    Orders No orders of the defined types were placed in this encounter.   No orders of the defined types were placed in this encounter.       F/U  Return in about 1 year (around 08/31/2020) for Annual Physical.  Finis Bud, M.D. 09/01/2019 2:41 PM

## 2019-09-01 NOTE — Progress Notes (Signed)
Patient comes in today for annual exam. She has a hemorrhoid that she would like looked at. She stated that when she walks it bleeds some.

## 2019-09-24 DIAGNOSIS — H2513 Age-related nuclear cataract, bilateral: Secondary | ICD-10-CM | POA: Diagnosis not present

## 2019-10-20 DIAGNOSIS — R7309 Other abnormal glucose: Secondary | ICD-10-CM | POA: Diagnosis not present

## 2019-10-20 DIAGNOSIS — M810 Age-related osteoporosis without current pathological fracture: Secondary | ICD-10-CM | POA: Diagnosis not present

## 2019-10-20 DIAGNOSIS — Z0000 Encounter for general adult medical examination without abnormal findings: Secondary | ICD-10-CM | POA: Diagnosis not present

## 2019-10-20 DIAGNOSIS — Z79899 Other long term (current) drug therapy: Secondary | ICD-10-CM | POA: Diagnosis not present

## 2019-10-20 DIAGNOSIS — E010 Iodine-deficiency related diffuse (endemic) goiter: Secondary | ICD-10-CM | POA: Diagnosis not present

## 2019-10-20 DIAGNOSIS — E538 Deficiency of other specified B group vitamins: Secondary | ICD-10-CM | POA: Diagnosis not present

## 2019-10-20 DIAGNOSIS — I10 Essential (primary) hypertension: Secondary | ICD-10-CM | POA: Diagnosis not present

## 2019-10-20 DIAGNOSIS — E7800 Pure hypercholesterolemia, unspecified: Secondary | ICD-10-CM | POA: Diagnosis not present

## 2019-10-20 DIAGNOSIS — M25552 Pain in left hip: Secondary | ICD-10-CM | POA: Diagnosis not present

## 2019-10-29 DIAGNOSIS — D2261 Melanocytic nevi of right upper limb, including shoulder: Secondary | ICD-10-CM | POA: Diagnosis not present

## 2019-10-29 DIAGNOSIS — Z1283 Encounter for screening for malignant neoplasm of skin: Secondary | ICD-10-CM | POA: Diagnosis not present

## 2019-10-29 DIAGNOSIS — Z86018 Personal history of other benign neoplasm: Secondary | ICD-10-CM | POA: Diagnosis not present

## 2019-10-29 DIAGNOSIS — D225 Melanocytic nevi of trunk: Secondary | ICD-10-CM | POA: Diagnosis not present

## 2019-10-29 DIAGNOSIS — D2270 Melanocytic nevi of unspecified lower limb, including hip: Secondary | ICD-10-CM | POA: Diagnosis not present

## 2019-10-29 DIAGNOSIS — D2262 Melanocytic nevi of left upper limb, including shoulder: Secondary | ICD-10-CM | POA: Diagnosis not present

## 2019-10-29 DIAGNOSIS — L578 Other skin changes due to chronic exposure to nonionizing radiation: Secondary | ICD-10-CM | POA: Diagnosis not present

## 2019-10-29 DIAGNOSIS — D692 Other nonthrombocytopenic purpura: Secondary | ICD-10-CM | POA: Diagnosis not present

## 2019-10-29 DIAGNOSIS — L821 Other seborrheic keratosis: Secondary | ICD-10-CM | POA: Diagnosis not present

## 2019-10-29 DIAGNOSIS — Z86007 Personal history of in-situ neoplasm of skin: Secondary | ICD-10-CM | POA: Diagnosis not present

## 2019-11-03 ENCOUNTER — Ambulatory Visit: Payer: PPO | Attending: Internal Medicine

## 2019-11-03 DIAGNOSIS — Z23 Encounter for immunization: Secondary | ICD-10-CM

## 2019-11-03 NOTE — Progress Notes (Signed)
   Covid-19 Vaccination Clinic  Name:  Jessica Morrison    MRN: [id number] DOB: 1946-02-15  11/03/2019  [name] was observed post Covid-19 immunization for 15 minutes without incidence. She was provided with Vaccine Information Sheet and instruction to access the V-Safe system.   [name] was instructed to call 911 with any severe reactions post vaccine: Marland Kitchen Difficulty breathing  . Swelling of your face and throat  . A fast heartbeat  . A bad rash all over your body  . Dizziness and weakness    Immunizations Administered    Name Date Dose VIS Date Route   Pfizer COVID-19 Vaccine 11/03/2019  5:06 PM 0.3 mL 09/25/2019 Intramuscular   Manufacturer: [place]   Lot: [id number]   [place]: [id number]

## 2019-11-18 ENCOUNTER — Other Ambulatory Visit: Payer: Self-pay | Admitting: Obstetrics and Gynecology

## 2019-11-21 ENCOUNTER — Ambulatory Visit: Payer: PPO | Attending: Internal Medicine

## 2019-11-21 DIAGNOSIS — Z23 Encounter for immunization: Secondary | ICD-10-CM | POA: Insufficient documentation

## 2019-11-21 NOTE — Progress Notes (Signed)
   Covid-19 Vaccination Clinic  Name:  Jessica Morrison    MRN: [id number] DOB: 07-27-1946  11/21/2019  [name] was observed post Covid-19 immunization for 15 minutes without incidence. She was provided with Vaccine Information Sheet and instruction to access the V-Safe system.   [name] was instructed to call 911 with any severe reactions post vaccine: Marland Kitchen Difficulty breathing  . Swelling of your face and throat  . A fast heartbeat  . A bad rash all over your body  . Dizziness and weakness    Immunizations Administered    Name Date Dose VIS Date Route   Pfizer COVID-19 Vaccine 11/21/2019 12:01 PM 0.3 mL 09/25/2019 Intramuscular   Manufacturer: [place]   Lot: [id number]   [place]: [id number]

## 2019-12-14 ENCOUNTER — Other Ambulatory Visit: Payer: Self-pay | Admitting: Internal Medicine

## 2019-12-14 DIAGNOSIS — Z1231 Encounter for screening mammogram for malignant neoplasm of breast: Secondary | ICD-10-CM

## 2020-03-31 ENCOUNTER — Ambulatory Visit: Payer: PPO | Admitting: Dermatology

## 2020-03-31 ENCOUNTER — Other Ambulatory Visit: Payer: Self-pay

## 2020-03-31 DIAGNOSIS — D1800 Hemangioma unspecified site: Secondary | ICD-10-CM | POA: Diagnosis not present

## 2020-03-31 DIAGNOSIS — D229 Melanocytic nevi, unspecified: Secondary | ICD-10-CM | POA: Diagnosis not present

## 2020-03-31 DIAGNOSIS — Z86018 Personal history of other benign neoplasm: Secondary | ICD-10-CM

## 2020-03-31 DIAGNOSIS — Z1283 Encounter for screening for malignant neoplasm of skin: Secondary | ICD-10-CM

## 2020-03-31 DIAGNOSIS — Z85820 Personal history of malignant melanoma of skin: Secondary | ICD-10-CM | POA: Diagnosis not present

## 2020-03-31 DIAGNOSIS — L578 Other skin changes due to chronic exposure to nonionizing radiation: Secondary | ICD-10-CM | POA: Diagnosis not present

## 2020-03-31 DIAGNOSIS — L820 Inflamed seborrheic keratosis: Secondary | ICD-10-CM | POA: Diagnosis not present

## 2020-03-31 DIAGNOSIS — D2372 Other benign neoplasm of skin of left lower limb, including hip: Secondary | ICD-10-CM

## 2020-03-31 DIAGNOSIS — D492 Neoplasm of unspecified behavior of bone, soft tissue, and skin: Secondary | ICD-10-CM

## 2020-03-31 DIAGNOSIS — L821 Other seborrheic keratosis: Secondary | ICD-10-CM | POA: Diagnosis not present

## 2020-03-31 DIAGNOSIS — L814 Other melanin hyperpigmentation: Secondary | ICD-10-CM | POA: Diagnosis not present

## 2020-03-31 DIAGNOSIS — D2272 Melanocytic nevi of left lower limb, including hip: Secondary | ICD-10-CM | POA: Diagnosis not present

## 2020-03-31 NOTE — Patient Instructions (Addendum)
Cryotherapy Aftercare ° °• Wash gently with soap and water everyday.   °• Apply Vaseline and Band-Aid daily until healed. °Wound Care Instructions ° °On the day following your surgery, you should begin doing daily dressing changes: °Remove the old dressing and discard it. °Cleanse the wound gently with tap water. This may be done in the shower or by placing a wet gauze pad directly on the wound and letting it soak for several minutes. °It is important to gently remove any dried blood from the wound in order to encourage healing. This may be done by gently rolling a moistened Q-tip on the dried blood. Do not pick at the wound. °If the wound should start to bleed, continue cleaning the wound, then place a moist gauze pad on the wound and hold pressure for a few minutes.  °Make sure you then dry the skin surrounding the wound completely or the tape will not stick to the skin. Do not use cotton balls on the wound. °After the wound is clean and dry, apply the ointment gently with a Q-tip. °Cut a non-stick pad to fit the size of the wound. Lay the pad flush to the wound. If the wound is draining, you may want to reinforce it with a small amount of gauze on top of the non-stick pad for a little added compression to the area. °Use the tape to seal the area completely. °Select from the following with respect to your individual situation: °If your wound has been stitched closed: continue the above steps 1-8 at least daily until your sutures are removed. °If your wound has been left open to heal: continue steps 1-8 at least daily for the first 3-4 weeks. °We would like for you to take a few extra precautions for at least the next week. °Sleep with your head elevated on pillows if our wound is on your head. °Do not bend over or lift heavy items to reduce the chance of elevated blood pressure to the wound °Do not participate in particularly strenuous activities. ° ° °Below is a list of dressing supplies you might need.   °Cotton-tipped applicators - Q-tips °Gauze pads (2x2 and/or 4x4) - All-Purpose Sponges °Non-stick dressing material - Telfa °Tape - Paper or Hypafix °New and clean tube of petroleum jelly - Vaseline  ° ° °Comments on Post-Operative Period °Slight swelling and redness often appear around the wound. This is normal and will disappear within several days following the surgery. °The healing wound will drain a brownish-red-yellow discharge during healing. This is a normal phase of wound healing. As the wound begins to heal, the drainage may increase in amount. Again, this drainage is normal. °Notify us if the drainage becomes persistently bloody, excessively swollen, or intensely painful or develops a foul odor or red streaks.  °If you should experience mild discomfort during the healing phase, you may take an aspirin-free medication such as Tylenol (acetaminophen). Notify us if the discomfort is severe or persistent. Avoid alcoholic beverages when taking pain medicine. ° °In Case of Wound Hemorrhage °A wound hemorrhage is when the bandage suddenly becomes soaked with bright red blood and flows profusely. If this happens, sit down or lie down with your head elevated. If the wound has a dressing on it, do not remove the dressing. Apply pressure to the existing gauze. If the wound is not covered, use a gauze pad to apply pressure and continue applying the pressure for 20 minutes without peeking. DO NOT COVER THE WOUND WITH A LARGE TOWEL   OR WASH CLOTH. Release your hand from the wound site but do not remove the dressing. If the bleeding has stopped, gently clean around the wound. Leave the dressing in place for 24 hours if possible. This wait time allows the blood vessels to close off so that you do not spark a new round of bleeding by disrupting the newly clotted blood vessels with an immediate dressing change. If the bleeding does not subside, continue to hold pressure. If matters are out of your control, contact an After  Hours clinic or go to the Emergency Room. ° °•  °•  °

## 2020-03-31 NOTE — Progress Notes (Signed)
Follow-Up Visit   Subjective  Jessica Morrison is a 74 y.o. female who presents for the following: Annual Exam (4 months f/u hx of Melanoma ). 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 Hx of Dysplastic nevus.  The patient presents for Total-Body Skin Exam (TBSE) for skin cancer screening and mole check.  The following portions of the chart were reviewed this encounter and updated as appropriate:  Tobacco  Allergies  Meds  Problems  Med Hx  Surg Hx  Fam Hx      Review of Systems:  No other skin or systemic complaints except as noted in HPI or Assessment and Plan.  Objective  Well appearing patient in no apparent distress; mood and affect are within normal limits.  A full examination was performed including scalp, head, eyes, ears, nose, lips, neck, chest, axillae, abdomen, back, buttocks, bilateral upper extremities, bilateral lower extremities, hands, feet, fingers, toes, fingernails, and toenails. All findings within normal limits unless otherwise noted below.  Objective  R angle of mandible: Well healed scar with no evidence of recurrence, no lymphadenopathy.   Objective  multiple see history: Scar with no evidence of recurrence.   Objective  Left Forearm: Erythematous keratotic or waxy stuck-on papule or plaque.   Objective  Left mid pretibial: 0.5 cm dark brown irregular macule    Assessment & Plan    History of melanoma R angle of mandible/neck Clear. Observe for recurrence. Call clinic for new or changing lesions.  Recommend regular skin exams, daily broad-spectrum spf 30+ sunscreen use, and photoprotection.   No lymphadenopathy Observe    History of dysplastic nevus multiple see history  Observe   Inflamed seborrheic keratosis Left Forearm  Destruction of lesion - Left Forearm Complexity: simple   Destruction method: cryotherapy   Informed consent: discussed and consent obtained   Timeout:  patient name, date of birth,  surgical site, and procedure verified Lesion destroyed using liquid nitrogen: Yes   Region frozen until ice ball extended beyond lesion: Yes   Outcome: patient tolerated procedure well with no complications   Post-procedure details: wound care instructions given    Neoplasm of skin Left mid pretibial  Epidermal / dermal shaving  Lesion diameter (cm):  0.5 Informed consent: discussed and consent obtained   Timeout: patient name, date of birth, surgical site, and procedure verified   Procedure prep:  Patient was prepped and draped in usual sterile fashion Prep type:  Isopropyl alcohol Anesthesia: the lesion was anesthetized in a standard fashion   Anesthetic:  1% lidocaine w/ epinephrine 1-100,000 buffered w/ 8.4% NaHCO3 Hemostasis achieved with: pressure, aluminum chloride and electrodesiccation   Outcome: patient tolerated procedure well   Post-procedure details: sterile dressing applied and wound care instructions given   Dressing type: bandage and petrolatum   Additional details:  Post defect 0.8 cm   Specimen 1 - Surgical pathology Differential Diagnosis: R/O Dysplastic nevus  Check Margins: No 0.5 cm dark brown irregular macule Lentigines - Scattered tan macules - Discussed due to sun exposure - Benign, observe - Call for any changes  Seborrheic Keratoses - Stuck-on, waxy, tan-brown papules and plaques  - Discussed benign etiology and prognosis. - Observe - Call for any changes  Melanocytic Nevi - Tan-brown and/or pink-flesh-colored symmetric macules and papules - Benign appearing on exam today - Observation - Call clinic for new or changing moles - Recommend daily use of broad spectrum spf 30+ sunscreen to sun-exposed areas.   Hemangiomas - Red papules -  Discussed benign nature - Observe - Call for any changes  Actinic Damage - diffuse scaly erythematous macules with underlying dyspigmentation - Recommend daily broad spectrum sunscreen SPF 30+ to  sun-exposed areas, reapply every 2 hours as needed.  - Call for new or changing lesions. Dermatofibroma L thigh  - Firm pink/brown papulenodule with dimple sign - Benign appearing - Call for any changes  Skin cancer screening performed today.  Return in about 4 months (around 07/31/2020). IMarye Round, CMA, am acting as scribe for [name], MD .  Documentation: I have reviewed the above documentation for accuracy and completeness, and I agree with the above.  [name], MD

## 2020-04-03 ENCOUNTER — Encounter: Payer: Self-pay | Admitting: Dermatology

## 2020-04-04 ENCOUNTER — Telehealth: Payer: Self-pay

## 2020-04-04 NOTE — Telephone Encounter (Signed)
-----   Message from Ralene Bathe, MD sent at 04/01/2020  7:14 PM EDT ----- Skin , left mid pretibial DYSPLASTIC NEVUS WITH MODERATE TO SEVERE ATYPIA, DEEP MARGIN INVOLVED, SEE DESCRIPTION  Dysplastic Moderate to severe Schedule for shave removal

## 2020-04-04 NOTE — Telephone Encounter (Signed)
Patient advised of biopsy results and scheduled for shave removal.

## 2020-04-04 NOTE — Telephone Encounter (Signed)
Left message on voicemail to return my call.  

## 2020-04-21 DIAGNOSIS — Z1211 Encounter for screening for malignant neoplasm of colon: Secondary | ICD-10-CM | POA: Diagnosis not present

## 2020-04-21 DIAGNOSIS — E7800 Pure hypercholesterolemia, unspecified: Secondary | ICD-10-CM | POA: Diagnosis not present

## 2020-04-21 DIAGNOSIS — M5489 Other dorsalgia: Secondary | ICD-10-CM | POA: Diagnosis not present

## 2020-04-21 DIAGNOSIS — I878 Other specified disorders of veins: Secondary | ICD-10-CM | POA: Diagnosis not present

## 2020-04-21 DIAGNOSIS — R7309 Other abnormal glucose: Secondary | ICD-10-CM | POA: Diagnosis not present

## 2020-04-21 DIAGNOSIS — I10 Essential (primary) hypertension: Secondary | ICD-10-CM | POA: Diagnosis not present

## 2020-04-21 DIAGNOSIS — E010 Iodine-deficiency related diffuse (endemic) goiter: Secondary | ICD-10-CM | POA: Diagnosis not present

## 2020-04-21 DIAGNOSIS — Z1231 Encounter for screening mammogram for malignant neoplasm of breast: Secondary | ICD-10-CM | POA: Diagnosis not present

## 2020-04-21 DIAGNOSIS — Z79899 Other long term (current) drug therapy: Secondary | ICD-10-CM | POA: Diagnosis not present

## 2020-04-21 DIAGNOSIS — M419 Scoliosis, unspecified: Secondary | ICD-10-CM | POA: Diagnosis not present

## 2020-04-21 DIAGNOSIS — E538 Deficiency of other specified B group vitamins: Secondary | ICD-10-CM | POA: Diagnosis not present

## 2020-04-21 DIAGNOSIS — M47816 Spondylosis without myelopathy or radiculopathy, lumbar region: Secondary | ICD-10-CM | POA: Diagnosis not present

## 2020-04-21 DIAGNOSIS — I700 Atherosclerosis of aorta: Secondary | ICD-10-CM | POA: Diagnosis not present

## 2020-04-21 DIAGNOSIS — M810 Age-related osteoporosis without current pathological fracture: Secondary | ICD-10-CM | POA: Diagnosis not present

## 2020-04-26 ENCOUNTER — Ambulatory Visit
Admission: RE | Admit: 2020-04-26 | Discharge: 2020-04-26 | Disposition: A | Discharge status: Home / Self Care | Payer: PPO | Source: Ambulatory Visit | Attending: Internal Medicine | Admitting: Internal Medicine

## 2020-04-26 DIAGNOSIS — Z1231 Encounter for screening mammogram for malignant neoplasm of breast: Secondary | ICD-10-CM | POA: Diagnosis not present

## 2020-05-04 ENCOUNTER — Ambulatory Visit: Payer: PPO | Admitting: Dermatology

## 2020-05-12 ENCOUNTER — Other Ambulatory Visit: Payer: Self-pay

## 2020-05-12 ENCOUNTER — Encounter: Payer: Self-pay | Admitting: Dermatology

## 2020-05-12 ENCOUNTER — Ambulatory Visit: Payer: PPO | Admitting: Dermatology

## 2020-05-12 DIAGNOSIS — D239 Other benign neoplasm of skin, unspecified: Secondary | ICD-10-CM

## 2020-05-12 DIAGNOSIS — D2272 Melanocytic nevi of left lower limb, including hip: Secondary | ICD-10-CM | POA: Diagnosis not present

## 2020-05-12 DIAGNOSIS — L905 Scar conditions and fibrosis of skin: Secondary | ICD-10-CM | POA: Diagnosis not present

## 2020-05-12 MED ORDER — MUPIROCIN 2 % EX OINT: 1.0000 "application " | TOPICAL_OINTMENT | Freq: Every day | CUTANEOUS | 0 refills | Status: DC

## 2020-05-12 NOTE — Patient Instructions (Signed)

## 2020-05-12 NOTE — Progress Notes (Signed)
   Follow-Up Visit   Subjective  Jessica Morrison is a 74 y.o. female who presents for the following: Bx proven Dysplastic Nevus moderate to severe (L mid pretibial, bx 03/31/2020). Patient presents for excision of this lesion.  The following portions of the chart were reviewed this encounter and updated as appropriate:  Tobacco  Allergies  Meds  Problems  Med Hx  Surg Hx  Fam Hx     Review of Systems:  No other skin or systemic complaints except as noted in HPI or Assessment and Plan.  Objective  Well appearing patient in no apparent distress; mood and affect are within normal limits.  A focused examination was performed including L leg. Relevant physical exam findings are noted in the Assessment and Plan.  Objective  Left mid pretibial: Pink bx site   Assessment & Plan  Dysplastic nevus -biopsy-proven moderate to severe.  Patient presents for excision today. Left mid pretibial  Bx proven, moderate to severe, simple excision today  Start Mupirocin ointment qd until healed.  Skin excision - Left mid pretibial  Lesion length (cm):  1.3 Lesion width (cm):  1.3 Margin per side (cm):  0 Total excision diameter (cm):  1.3 Informed consent: discussed and consent obtained   Timeout: patient name, date of birth, surgical site, and procedure verified   Procedure prep:  Patient was prepped and draped in usual sterile fashion Prep type:  Isopropyl alcohol and povidone-iodine Anesthesia: the lesion was anesthetized in a standard fashion   Anesthetic:  1% lidocaine w/ epinephrine 1-100,000 buffered w/ 8.4% NaHCO3 Instrument used: #15 blade   Hemostasis achieved with: pressure   Hemostasis achieved with comment:  Electrocautery Outcome: patient tolerated procedure well with no complications   Post-procedure details: sterile dressing applied and wound care instructions given   Dressing type: bandage and pressure dressing (Mupirocin)    mupirocin ointment (BACTROBAN) 2 % -  Left mid pretibial  Specimen 1 - Surgical pathology Differential Diagnosis: D48.5 Dysplastic Nevus Check Margins: yes Pink bx site 1.3cm, bx proven moderate to severe dysplastic nevus Previous bx [phone]  Return for as scheduled for 89m f/u.   I, [name], RMA, am acting as scribe for [name], MD .  Documentation: I have reviewed the above documentation for accuracy and completeness, and I agree with the above.  [name], MD

## 2020-05-16 ENCOUNTER — Encounter: Payer: Self-pay | Admitting: Dermatology

## 2020-05-17 ENCOUNTER — Other Ambulatory Visit: Payer: Self-pay | Admitting: Obstetrics and Gynecology

## 2020-05-17 NOTE — Telephone Encounter (Signed)
Please advise on refill.

## 2020-05-19 ENCOUNTER — Telehealth: Payer: Self-pay

## 2020-05-19 NOTE — Telephone Encounter (Signed)
-----   Message from Ralene Bathe, MD sent at 05/19/2020 11:34 AM EDT ----- Skin , left mid pretibial RESHAVE, SCAR, NO RESIDUAL DYSPLASTIC NEVUS  Site of moderate to severe dysplastic Excised and margins clear Recheck next visit  No further treatment needed

## 2020-05-19 NOTE — Telephone Encounter (Signed)
Left message for patient to call office for results/hd 

## 2020-05-24 NOTE — Telephone Encounter (Signed)
-----   Message from Ralene Bathe, MD sent at 05/19/2020 11:34 AM EDT ----- Skin , left mid pretibial RESHAVE, SCAR, NO RESIDUAL DYSPLASTIC NEVUS  Site of moderate to severe dysplastic Excised and margins clear Recheck next visit  No further treatment needed

## 2020-05-24 NOTE — Telephone Encounter (Signed)
Discussed biopsy results with pt  °

## 2020-06-21 DIAGNOSIS — Z20822 Contact with and (suspected) exposure to covid-19: Secondary | ICD-10-CM | POA: Diagnosis not present

## 2020-06-21 DIAGNOSIS — Z01812 Encounter for preprocedural laboratory examination: Secondary | ICD-10-CM | POA: Diagnosis not present

## 2020-06-21 DIAGNOSIS — K649 Unspecified hemorrhoids: Secondary | ICD-10-CM | POA: Diagnosis not present

## 2020-06-21 DIAGNOSIS — Z86010 Personal history of colonic polyps: Secondary | ICD-10-CM | POA: Diagnosis not present

## 2020-07-28 ENCOUNTER — Ambulatory Visit: Payer: PPO | Admitting: Dermatology

## 2020-07-28 DIAGNOSIS — E538 Deficiency of other specified B group vitamins: Secondary | ICD-10-CM | POA: Diagnosis not present

## 2020-07-28 DIAGNOSIS — I10 Essential (primary) hypertension: Secondary | ICD-10-CM | POA: Diagnosis not present

## 2020-07-28 DIAGNOSIS — R413 Other amnesia: Secondary | ICD-10-CM | POA: Diagnosis not present

## 2020-07-28 DIAGNOSIS — Z79899 Other long term (current) drug therapy: Secondary | ICD-10-CM | POA: Diagnosis not present

## 2020-07-28 DIAGNOSIS — E7800 Pure hypercholesterolemia, unspecified: Secondary | ICD-10-CM | POA: Diagnosis not present

## 2020-07-28 DIAGNOSIS — R7309 Other abnormal glucose: Secondary | ICD-10-CM | POA: Diagnosis not present

## 2020-08-04 ENCOUNTER — Ambulatory Visit: Payer: PPO | Admitting: Dermatology

## 2020-08-04 ENCOUNTER — Encounter: Payer: Self-pay | Admitting: Dermatology

## 2020-08-04 ENCOUNTER — Other Ambulatory Visit: Payer: Self-pay

## 2020-08-04 DIAGNOSIS — D1800 Hemangioma unspecified site: Secondary | ICD-10-CM | POA: Diagnosis not present

## 2020-08-04 DIAGNOSIS — D229 Melanocytic nevi, unspecified: Secondary | ICD-10-CM | POA: Diagnosis not present

## 2020-08-04 DIAGNOSIS — L570 Actinic keratosis: Secondary | ICD-10-CM

## 2020-08-04 DIAGNOSIS — D485 Neoplasm of uncertain behavior of skin: Secondary | ICD-10-CM | POA: Diagnosis not present

## 2020-08-04 DIAGNOSIS — Z85820 Personal history of malignant melanoma of skin: Secondary | ICD-10-CM

## 2020-08-04 DIAGNOSIS — Z86018 Personal history of other benign neoplasm: Secondary | ICD-10-CM

## 2020-08-04 DIAGNOSIS — L814 Other melanin hyperpigmentation: Secondary | ICD-10-CM | POA: Diagnosis not present

## 2020-08-04 DIAGNOSIS — C44319 Basal cell carcinoma of skin of other parts of face: Secondary | ICD-10-CM | POA: Diagnosis not present

## 2020-08-04 DIAGNOSIS — Z1283 Encounter for screening for malignant neoplasm of skin: Secondary | ICD-10-CM | POA: Diagnosis not present

## 2020-08-04 DIAGNOSIS — L578 Other skin changes due to chronic exposure to nonionizing radiation: Secondary | ICD-10-CM | POA: Diagnosis not present

## 2020-08-04 DIAGNOSIS — L821 Other seborrheic keratosis: Secondary | ICD-10-CM | POA: Diagnosis not present

## 2020-08-04 DIAGNOSIS — L820 Inflamed seborrheic keratosis: Secondary | ICD-10-CM | POA: Diagnosis not present

## 2020-08-04 DIAGNOSIS — C4491 Basal cell carcinoma of skin, unspecified: Secondary | ICD-10-CM

## 2020-08-04 HISTORY — DX: Basal cell carcinoma of skin, unspecified: C44.91

## 2020-08-04 NOTE — Progress Notes (Signed)
Follow-Up Visit   Subjective  Jessica Morrison is a 74 y.o. female who presents for the following: Annual Exam (Hx MM, dysplastic nevi ). The patient presents for Total-Body Skin Exam (TBSE) for skin cancer screening and mole check.  The following portions of the chart were reviewed this encounter and updated as appropriate:  Tobacco  Allergies  Meds  Problems  Med Hx  Surg Hx  Fam Hx     Review of Systems:  No other skin or systemic complaints except as noted in HPI or Assessment and Plan.  Objective  Well appearing patient in no apparent distress; mood and affect are within normal limits.  A full examination was performed including scalp, head, eyes, ears, nose, lips, neck, chest, axillae, abdomen, back, buttocks, bilateral upper extremities, bilateral lower extremities, hands, feet, fingers, toes, fingernails, and toenails. All findings within normal limits unless otherwise noted below.  Objective  R bicep: Erythematous keratotic or waxy stuck-on papule or plaque.   Objective  0.5 cm above the L mid brow: 0.4 cm pink papule   Objective  R infraorbital: Erythematous thin papules/macules with gritty scale.   Assessment & Plan  Inflamed seborrheic keratosis R bicep  Destruction of lesion - R bicep Complexity: simple   Destruction method: cryotherapy   Informed consent: discussed and consent obtained   Timeout:  patient name, date of birth, surgical site, and procedure verified Lesion destroyed using liquid nitrogen: Yes   Region frozen until ice ball extended beyond lesion: Yes   Outcome: patient tolerated procedure well with no complications   Post-procedure details: wound care instructions given    Neoplasm of uncertain behavior of skin 0.5 cm above the L mid brow  Skin / nail biopsy Type of biopsy: tangential   Informed consent: discussed and consent obtained   Timeout: patient name, date of birth, surgical site, and procedure verified   Procedure prep:   Patient was prepped and draped in usual sterile fashion Prep type:  Isopropyl alcohol Anesthesia: the lesion was anesthetized in a standard fashion   Anesthetic:  1% lidocaine w/ epinephrine 1-100,000 buffered w/ 8.4% NaHCO3 Instrument used: flexible razor blade   Hemostasis achieved with: pressure, aluminum chloride and electrodesiccation   Outcome: patient tolerated procedure well   Post-procedure details: sterile dressing applied and wound care instructions given   Dressing type: bandage and petrolatum    Specimen 1 - Surgical pathology Differential Diagnosis: D48.5 r/o BCC vs other  Check Margins: No 0.4 cm pink papule  AK (actinic keratosis) R infraorbital  Destruction of lesion - R infraorbital Complexity: simple   Destruction method: cryotherapy   Informed consent: discussed and consent obtained   Timeout:  patient name, date of birth, surgical site, and procedure verified Lesion destroyed using liquid nitrogen: Yes   Region frozen until ice ball extended beyond lesion: Yes   Outcome: patient tolerated procedure well with no complications   Post-procedure details: wound care instructions given    Skin cancer screening   Lentigines - Scattered tan macules - Discussed due to sun exposure - Benign, observe - Call for any changes   Seborrheic Keratoses - Stuck-on, waxy, tan-brown papules and plaques  - Discussed benign etiology and prognosis. - Observe - Call for any changes  Melanocytic Nevi - Tan-brown and/or pink-flesh-colored symmetric macules and papules - Benign appearing on exam today - Observation - Call clinic for new or changing moles - Recommend daily use of broad spectrum spf 30+ sunscreen to sun-exposed areas.  Hemangiomas - Red papules - Discussed benign nature - Observe - Call for any changes  Actinic Damage - diffuse scaly erythematous macules with underlying dyspigmentation - Recommend daily broad spectrum sunscreen SPF 30+ to sun-exposed  areas, reapply every 2 hours as needed.  - Call for new or changing lesions.  Skin cancer screening performed today.  Return in about 6 months (around 02/02/2021) for TBSE - Hx MM, dysplastic nevi .  Luther Redo, CMA, am acting as scribe for [name], MD .  Documentation: I have reviewed the above documentation for accuracy and completeness, and I agree with the above.  [name], MD

## 2020-08-04 NOTE — Patient Instructions (Signed)

## 2020-08-09 ENCOUNTER — Encounter: Payer: Self-pay | Admitting: Dermatology

## 2020-08-10 ENCOUNTER — Telehealth: Payer: Self-pay

## 2020-08-10 NOTE — Telephone Encounter (Signed)
Patient advised of information per Dr. Nehemiah Massed. She has been scheduled for a EDC.

## 2020-08-10 NOTE — Telephone Encounter (Signed)
-----   Message from Ralene Bathe, MD sent at 08/10/2020 10:27 AM EDT ----- Diagnosis Skin , 0.5cm above the left mid brow BASAL CELL CARCINOMA, NODULAR PATTERN  Cancer - BCC Small lesion 0.4 cm Schedule for treatment (EDC)

## 2020-08-16 ENCOUNTER — Other Ambulatory Visit: Payer: Self-pay | Admitting: Obstetrics and Gynecology

## 2020-08-19 DIAGNOSIS — R413 Other amnesia: Secondary | ICD-10-CM | POA: Diagnosis not present

## 2020-08-19 DIAGNOSIS — E569 Vitamin deficiency, unspecified: Secondary | ICD-10-CM | POA: Diagnosis not present

## 2020-08-22 ENCOUNTER — Other Ambulatory Visit (HOSPITAL_COMMUNITY): Payer: Self-pay | Admitting: Neurology

## 2020-08-22 ENCOUNTER — Other Ambulatory Visit: Payer: Self-pay | Admitting: Neurology

## 2020-08-22 DIAGNOSIS — R413 Other amnesia: Secondary | ICD-10-CM

## 2020-08-30 DIAGNOSIS — Z01818 Encounter for other preprocedural examination: Secondary | ICD-10-CM | POA: Diagnosis not present

## 2020-09-01 ENCOUNTER — Encounter: Payer: PPO | Admitting: Obstetrics and Gynecology

## 2020-09-02 DIAGNOSIS — Z86010 Personal history of colonic polyps: Secondary | ICD-10-CM | POA: Diagnosis not present

## 2020-09-02 DIAGNOSIS — K6289 Other specified diseases of anus and rectum: Secondary | ICD-10-CM | POA: Diagnosis not present

## 2020-09-02 DIAGNOSIS — Z1211 Encounter for screening for malignant neoplasm of colon: Secondary | ICD-10-CM | POA: Diagnosis not present

## 2020-09-02 DIAGNOSIS — Q438 Other specified congenital malformations of intestine: Secondary | ICD-10-CM | POA: Diagnosis not present

## 2020-09-02 DIAGNOSIS — K5730 Diverticulosis of large intestine without perforation or abscess without bleeding: Secondary | ICD-10-CM | POA: Diagnosis not present

## 2020-09-02 HISTORY — PX: COLONOSCOPY: SHX174

## 2020-09-05 ENCOUNTER — Other Ambulatory Visit: Payer: Self-pay

## 2020-09-05 ENCOUNTER — Ambulatory Visit (HOSPITAL_COMMUNITY)
Admission: RE | Admit: 2020-09-05 | Discharge: 2020-09-05 | Disposition: A | Discharge status: Home / Self Care | Payer: PPO | Source: Ambulatory Visit | Attending: Neurology | Admitting: Neurology

## 2020-09-05 DIAGNOSIS — R413 Other amnesia: Secondary | ICD-10-CM | POA: Diagnosis not present

## 2020-09-05 DIAGNOSIS — I6782 Cerebral ischemia: Secondary | ICD-10-CM | POA: Diagnosis not present

## 2020-09-05 DIAGNOSIS — M47812 Spondylosis without myelopathy or radiculopathy, cervical region: Secondary | ICD-10-CM | POA: Diagnosis not present

## 2020-09-05 DIAGNOSIS — G9389 Other specified disorders of brain: Secondary | ICD-10-CM | POA: Diagnosis not present

## 2020-09-05 DIAGNOSIS — R9082 White matter disease, unspecified: Secondary | ICD-10-CM | POA: Diagnosis not present

## 2020-09-06 ENCOUNTER — Ambulatory Visit (INDEPENDENT_AMBULATORY_CARE_PROVIDER_SITE_OTHER): Payer: PPO | Admitting: Obstetrics and Gynecology

## 2020-09-06 ENCOUNTER — Encounter: Payer: Self-pay | Admitting: Obstetrics and Gynecology

## 2020-09-06 VITALS — BP 155/78 | HR 72 | Ht <= 58 in | Wt 115.1 lb

## 2020-09-06 DIAGNOSIS — F0390 Unspecified dementia without behavioral disturbance: Secondary | ICD-10-CM

## 2020-09-06 DIAGNOSIS — Z01419 Encounter for gynecological examination (general) (routine) without abnormal findings: Secondary | ICD-10-CM | POA: Diagnosis not present

## 2020-09-06 DIAGNOSIS — M810 Age-related osteoporosis without current pathological fracture: Secondary | ICD-10-CM

## 2020-09-06 NOTE — Progress Notes (Signed)
HPI:      [name] is a 74 y.o. G2P1101 who LMP was No LMP recorded. Patient has had a hysterectomy.  Subjective:   She presents today for her annual examination.  She states that she has had a difficult year.  She was diagnosed with onset of dementia.  She is currently taking a medication that seems to be helping her and she has improved. She continues to take Osphena calcium and vitamin D for her osteoporosis. General lab work and prescriptions through Dr. Doy Hutching. Of significant note patient has previously had a hysterectomy.    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 She does not have any pertinent problems on file. She  has a past surgical history that includes Abdominal hysterectomy; Oophorectomy; Colonoscopy with propofol (N/A, 05/09/2015); Cesarean section; Bilateral oophorectomy (1999); and Melanoma excision (Right, 07/29/2018). Her family history includes Emphysema in her father. She  reports that she has never smoked. She has never used smokeless tobacco. She reports that she does not drink alcohol and does not use drugs. She has a current medication list which includes the following prescription(s): aspirin, aspirin ec, calcium-vitamin d, cholecalciferol, donepezil, hm vitamin d3, omeprazole, raloxifene, rosuvastatin, hydrocortisone, and mupirocin  ointment. She is allergic to codeine, neosporin wound cleanser [benzalkonium chloride], and penicillins.       Review of Systems:  Review of Systems  Constitutional: Denied constitutional symptoms, night sweats, recent illness, fatigue, fever, insomnia and weight loss.  Eyes: Denied eye symptoms, eye pain, photophobia, vision change and visual disturbance.  Ears/Nose/Throat/Neck: Denied ear, nose, throat or neck symptoms, hearing loss, nasal discharge, sinus congestion and sore throat.  Cardiovascular: Denied cardiovascular symptoms, arrhythmia, chest pain/pressure, edema, exercise intolerance, orthopnea and palpitations.  Respiratory: Denied pulmonary symptoms, asthma, pleuritic pain, productive sputum, cough, dyspnea and wheezing.  Gastrointestinal: Denied, gastro-esophageal reflux, melena, nausea and vomiting.  Genitourinary: Denied genitourinary symptoms including symptomatic vaginal discharge, pelvic relaxation issues, and urinary complaints.  Musculoskeletal: Denied musculoskeletal symptoms, stiffness, swelling, muscle weakness and myalgia.  Dermatologic: Denied dermatology symptoms, rash and scar.  Neurologic: See HPI for additional information.  Psychiatric: Denied psychiatric symptoms, anxiety and depression.  Endocrine: Denied endocrine symptoms including hot flashes and night sweats.   Meds:   Current Outpatient Medications on File Prior to Visit  Medication Sig Dispense Refill  . aspirin 81 MG chewable tablet     . aspirin EC 81 MG tablet Take 81 mg by mouth daily.    . calcium-vitamin D (CALCIUM 500/D) 500-200 MG-UNIT tablet Take by mouth.    . Cholecalciferol 2000 UNITS CAPS Take 1 capsule by mouth.    . donepezil (ARICEPT) 10 MG tablet Take by mouth.    Marland Kitchen HM VITAMIN D3 100 MCG (4000 UT) CAPS     . omeprazole (PRILOSEC) 40 MG capsule     . raloxifene (EVISTA) 60 MG tablet TAKE 1 TABLET BY MOUTH ONCE A DAY 90 tablet 1  . rosuvastatin (CRESTOR) 5 MG tablet Take 5 mg by  mouth  daily.    . hydrocortisone 2.5 % cream  (Patient not taking: Reported on 09/06/2020)    . mupirocin ointment (BACTROBAN) 2 % Apply 1 application topically daily. Qd to wound on left leg (Patient not taking: Reported on 09/06/2020) 22 g 0   No current facility-administered medications on file prior to visit.          Objective:     Vitals:   09/06/20 0902  BP: (!) 155/78  Pulse: 72    Filed Weights   09/06/20 0902  Weight: 115 lb 1.6 oz (52.2 kg)              Physical examination General NAD, Conversant  HEENT Atraumatic; Op clear with mmm.  Normo-cephalic. Pupils reactive. Anicteric sclerae  Thyroid/Neck Smooth without nodularity or enlargement. Normal ROM.  Neck Supple.  Skin No rashes, lesions or ulceration. Normal palpated skin turgor. No nodularity.  Breasts: No masses or discharge.  Symmetric.  No axillary adenopathy.  Lungs: Clear to auscultation.No rales or wheezes. Normal Respiratory effort, no retractions.  Heart: NSR.  No murmurs or rubs appreciated. No periferal edema  Abdomen: Soft.  Non-tender.  No masses.  No HSM. No hernia  Extremities: Moves all appropriately.  Normal ROM for age. No lymphadenopathy.  Neuro: Oriented to PPT.  Normal mood. Normal affect.     Pelvic:   Vulva: Normal appearance.  No lesions.  Vagina: No lesions or abnormalities noted.  Atrophic  Support: Normal pelvic support.  Urethra No masses tenderness or scarring.  Meatus Normal size without lesions or prolapse.  Cervix:  Surgically absent  Anus: Normal exam.  No lesions.  Perineum: Normal exam.  No lesions.        Bimanual   Uterus:  Surgically absent  Adnexae: No masses.  Non-tender to palpation.  Cul-de-sac: Negative for abnormality.      Assessment:    G2P1101 Patient Active Problem List   Diagnosis Date Noted  . Skin macule 08/23/2016  . Family history of malignant melanoma 08/23/2016  . Endometriosis 08/16/2015  . Vaginal atrophy 08/16/2015  . Bloodgood  disease 04/22/2014  . Calculus of kidney 04/22/2014  . BP (high blood pressure) 03/16/2014  . HLD (hyperlipidemia) 03/16/2014  . Arthritis, degenerative 03/16/2014  . Osteoporosis 03/16/2014  . Raynaud's syndrome without gangrene 03/16/2014  . Big thyroid 03/16/2014     1. Well woman exam with routine gynecological exam   2. Age-related osteoporosis without current pathological fracture   3. Dementia without behavioral disturbance, unspecified dementia type ([place])        Plan:            1.  Basic Screening Recommendations The basic screening recommendations for asymptomatic women were discussed with the patient during her visit.  The age-appropriate recommendations were discussed with her and the rational for the tests reviewed.  When I am informed by the patient that another primary care physician has previously obtained the age-appropriate tests and they are up-to-date, only outstanding tests are ordered and referrals given as necessary.  Abnormal results of tests will be discussed with her when all of her results are completed.  Routine preventative health maintenance measures emphasized: Exercise/Diet/Weight control, Tobacco Warnings, Alcohol/Substance use risks and Stress Management Recommend continuation of Osphena, calcium and vitamin D. Orders No orders of the defined types were placed in this encounter.   No orders of the defined types were placed in this encounter.           F/U  Return  in about 1 year (around 09/06/2021) for Annual Physical.  Finis Bud, M.D. 09/06/2020 9:36 AM

## 2020-09-15 ENCOUNTER — Encounter: Payer: Self-pay | Admitting: Dermatology

## 2020-09-15 ENCOUNTER — Ambulatory Visit: Payer: PPO | Admitting: Dermatology

## 2020-09-15 ENCOUNTER — Other Ambulatory Visit: Payer: Self-pay

## 2020-09-15 DIAGNOSIS — L820 Inflamed seborrheic keratosis: Secondary | ICD-10-CM

## 2020-09-15 DIAGNOSIS — C44319 Basal cell carcinoma of skin of other parts of face: Secondary | ICD-10-CM

## 2020-09-15 DIAGNOSIS — L578 Other skin changes due to chronic exposure to nonionizing radiation: Secondary | ICD-10-CM

## 2020-09-15 NOTE — Progress Notes (Signed)
   Follow-Up Visit   Subjective  Jessica Morrison is a 74 y.o. female who presents for the following: Basal Cell Carcinoma (bx proven 08/04/20, 0.5cm above the left mid brow, pt presents for [facility]) and growths (forehead, few months).  The following portions of the chart were reviewed this encounter and updated as appropriate:  Tobacco  Allergies  Meds  Problems  Med Hx  Surg Hx  Fam Hx     Review of Systems:  No other skin or systemic complaints except as noted in HPI or Assessment and Plan.  Objective  Well appearing patient in no apparent distress; mood and affect are within normal limits.  A focused examination was performed including face. Relevant physical exam findings are noted in the Assessment and Plan.  Objective  0.5cm above the left mid brow: Pink bx site  Objective  superior forehead x 5 (5): Erythematous keratotic or waxy stuck-on papule or plaque.    Assessment & Plan  Basal cell carcinoma (BCC) of skin of other part of face 0.5cm above the left mid brow  Destruction of lesion Complexity: extensive   Destruction method: electrodesiccation and curettage   Informed consent: discussed and consent obtained   Timeout:  patient name, date of birth, surgical site, and procedure verified Procedure prep:  Patient was prepped and draped in usual sterile fashion Prep type:  Isopropyl alcohol Anesthesia: the lesion was anesthetized in a standard fashion   Anesthetic:  1% lidocaine w/ epinephrine 1-100,000 buffered w/ 8.4% NaHCO3 (1.0cc) Curettage performed in three different directions: Yes   Electrodesiccation performed over the curetted area: Yes   Lesion length (cm):  1.1 Lesion width (cm):  1.1 Margin per side (cm):  0 Final wound size (cm):  1.1 Hemostasis achieved with:  pressure, aluminum chloride and electrodesiccation Outcome: patient tolerated procedure well with no complications   Post-procedure details: sterile dressing applied and wound care  instructions given   Dressing type: bandage and petrolatum    Bx proven  Inflamed seborrheic keratosis (5) superior forehead x 5  Destruction of lesion - superior forehead x 5 Complexity: simple   Destruction method: cryotherapy   Informed consent: discussed and consent obtained   Timeout:  patient name, date of birth, surgical site, and procedure verified Lesion destroyed using liquid nitrogen: Yes   Region frozen until ice ball extended beyond lesion: Yes   Outcome: patient tolerated procedure well with no complications   Post-procedure details: wound care instructions given    Actinic Damage - chronic, secondary to cumulative UV radiation exposure/sun exposure over time - diffuse scaly erythematous macules with underlying dyspigmentation - Recommend daily broad spectrum sunscreen SPF 30+ to sun-exposed areas, reapply every 2 hours as needed.  - Call for new or changing lesions.  Return for as scheduled for TBSE 01/19/2021 at 9:30.   I, [name], RMA, am acting as scribe for [name], MD .  Documentation: I have reviewed the above documentation for accuracy and completeness, and I agree with the above.  [name], MD

## 2020-09-15 NOTE — Patient Instructions (Signed)

## 2020-09-20 DIAGNOSIS — H903 Sensorineural hearing loss, bilateral: Secondary | ICD-10-CM | POA: Diagnosis not present

## 2020-09-20 DIAGNOSIS — R4181 Age-related cognitive decline: Secondary | ICD-10-CM | POA: Diagnosis not present

## 2020-09-21 ENCOUNTER — Encounter: Payer: Self-pay | Admitting: Dermatology

## 2020-10-28 DIAGNOSIS — M545 Low back pain, unspecified: Secondary | ICD-10-CM | POA: Diagnosis not present

## 2020-10-28 DIAGNOSIS — R1031 Right lower quadrant pain: Secondary | ICD-10-CM | POA: Diagnosis not present

## 2020-10-28 DIAGNOSIS — M25551 Pain in right hip: Secondary | ICD-10-CM | POA: Diagnosis not present

## 2020-11-10 ENCOUNTER — Other Ambulatory Visit: Payer: Self-pay | Admitting: Obstetrics and Gynecology

## 2020-11-10 DIAGNOSIS — E7800 Pure hypercholesterolemia, unspecified: Secondary | ICD-10-CM | POA: Diagnosis not present

## 2020-11-10 DIAGNOSIS — E538 Deficiency of other specified B group vitamins: Secondary | ICD-10-CM | POA: Diagnosis not present

## 2020-11-10 DIAGNOSIS — Z79899 Other long term (current) drug therapy: Secondary | ICD-10-CM | POA: Diagnosis not present

## 2020-11-10 DIAGNOSIS — R7309 Other abnormal glucose: Secondary | ICD-10-CM | POA: Diagnosis not present

## 2020-11-10 DIAGNOSIS — I10 Essential (primary) hypertension: Secondary | ICD-10-CM | POA: Diagnosis not present

## 2020-11-11 NOTE — Telephone Encounter (Signed)
Please advise on refill.

## 2020-11-17 DIAGNOSIS — Z0000 Encounter for general adult medical examination without abnormal findings: Secondary | ICD-10-CM | POA: Diagnosis not present

## 2020-11-17 DIAGNOSIS — E538 Deficiency of other specified B group vitamins: Secondary | ICD-10-CM | POA: Diagnosis not present

## 2020-11-17 DIAGNOSIS — R7309 Other abnormal glucose: Secondary | ICD-10-CM | POA: Diagnosis not present

## 2020-11-17 DIAGNOSIS — R109 Unspecified abdominal pain: Secondary | ICD-10-CM | POA: Diagnosis not present

## 2020-11-17 DIAGNOSIS — I10 Essential (primary) hypertension: Secondary | ICD-10-CM | POA: Diagnosis not present

## 2020-11-17 DIAGNOSIS — E7800 Pure hypercholesterolemia, unspecified: Secondary | ICD-10-CM | POA: Diagnosis not present

## 2020-12-02 DIAGNOSIS — H2513 Age-related nuclear cataract, bilateral: Secondary | ICD-10-CM | POA: Diagnosis not present

## 2020-12-19 ENCOUNTER — Other Ambulatory Visit: Payer: Self-pay

## 2020-12-19 ENCOUNTER — Encounter: Payer: Self-pay | Admitting: Dermatology

## 2020-12-19 ENCOUNTER — Ambulatory Visit: Payer: PPO | Admitting: Dermatology

## 2020-12-19 DIAGNOSIS — Z85820 Personal history of malignant melanoma of skin: Secondary | ICD-10-CM

## 2020-12-19 DIAGNOSIS — Z1283 Encounter for screening for malignant neoplasm of skin: Secondary | ICD-10-CM | POA: Diagnosis not present

## 2020-12-19 DIAGNOSIS — Z86018 Personal history of other benign neoplasm: Secondary | ICD-10-CM | POA: Diagnosis not present

## 2020-12-19 DIAGNOSIS — Z85828 Personal history of other malignant neoplasm of skin: Secondary | ICD-10-CM

## 2020-12-19 DIAGNOSIS — L814 Other melanin hyperpigmentation: Secondary | ICD-10-CM | POA: Diagnosis not present

## 2020-12-19 DIAGNOSIS — L821 Other seborrheic keratosis: Secondary | ICD-10-CM | POA: Diagnosis not present

## 2020-12-19 DIAGNOSIS — D692 Other nonthrombocytopenic purpura: Secondary | ICD-10-CM

## 2020-12-19 DIAGNOSIS — D229 Melanocytic nevi, unspecified: Secondary | ICD-10-CM | POA: Diagnosis not present

## 2020-12-19 DIAGNOSIS — L578 Other skin changes due to chronic exposure to nonionizing radiation: Secondary | ICD-10-CM | POA: Diagnosis not present

## 2020-12-19 DIAGNOSIS — D1800 Hemangioma unspecified site: Secondary | ICD-10-CM | POA: Diagnosis not present

## 2020-12-19 DIAGNOSIS — D2372 Other benign neoplasm of skin of left lower limb, including hip: Secondary | ICD-10-CM

## 2020-12-19 DIAGNOSIS — D239 Other benign neoplasm of skin, unspecified: Secondary | ICD-10-CM

## 2020-12-19 NOTE — Progress Notes (Signed)
Follow-Up Visit   Subjective  Jessica Morrison is a 75 y.o. female who presents for the following: Annual Exam (Hx dysplastic nevi, BCC, MM ). The patient presents for Total-Body Skin Exam (TBSE) for skin cancer screening and mole check.  The following portions of the chart were reviewed this encounter and updated as appropriate:   Tobacco  Allergies  Meds  Problems  Med Hx  Surg Hx  Fam Hx     Review of Systems:  No other skin or systemic complaints except as noted in HPI or Assessment and Plan.  Objective  Well appearing patient in no apparent distress; mood and affect are within normal limits.  A full examination was performed including scalp, head, eyes, ears, nose, lips, neck, chest, axillae, abdomen, back, buttocks, bilateral upper extremities, bilateral lower extremities, hands, feet, fingers, toes, fingernails, and toenails. All findings within normal limits unless otherwise noted below.  Objective  L thigh: Firm pink/brown papulenodule with dimple sign.   Assessment & Plan  Dermatofibroma L thigh Benign-appearing.  Observation.  Call clinic for new or changing lesions.  Recommend daily use of broad spectrum spf 30+ sunscreen to sun-exposed areas.   Skin cancer screening  Lentigines - Scattered tan macules - Due to sun exposure - Benign-appering, observe - Recommend daily broad spectrum sunscreen SPF 30+ to sun-exposed areas, reapply every 2 hours as needed. - Call for any changes  Seborrheic Keratoses - Stuck-on, waxy, tan-brown papules and plaques  - Discussed benign etiology and prognosis. - Observe - Call for any changes  Melanocytic Nevi - Tan-brown and/or pink-flesh-colored symmetric macules and papules - Benign appearing on exam today - Observation - Call clinic for new or changing moles - Recommend daily use of broad spectrum spf 30+ sunscreen to sun-exposed areas.   Hemangiomas - Red papules - Discussed benign nature - Observe - Call for  any changes  Actinic Damage - Chronic, secondary to cumulative UV/sun exposure - diffuse scaly erythematous macules with underlying dyspigmentation - Recommend daily broad spectrum sunscreen SPF 30+ to sun-exposed areas, reapply every 2 hours as needed.  - Call for new or changing lesions.  History of Basal Cell Carcinoma of the Skin - No evidence of recurrence today - Recommend regular full body skin exams - Recommend daily broad spectrum sunscreen SPF 30+ to sun-exposed areas, reapply every 2 hours as needed.  - Call if any new or changing lesions are noted between office visits  History of Dysplastic Nevi - No evidence of recurrence today - Recommend regular full body skin exams - Recommend daily broad spectrum sunscreen SPF 30+ to sun-exposed areas, reapply every 2 hours as needed.  - Call if any new or changing lesions are noted between office visits  History of Melanoma - No evidence of recurrence today - No lymphadenopathy - Recommend regular full body skin exams - Recommend daily broad spectrum sunscreen SPF 30+ to sun-exposed areas, reapply every 2 hours as needed.  - Call if any new or changing lesions are noted between office visits  Purpura - Chronic; persistent and recurrent.  Treatable, but not curable. - Violaceous macules and patches - Benign - Related to trauma, age, sun damage and/or use of blood thinners, chronic use of topical and/or oral steroids - Observe - Can use OTC arnica containing moisturizer such as Dermend Bruise Formula if desired - Call for worsening or other concerns  Skin cancer screening performed today.  Return in about 6 months (around 06/21/2021) for TBSE - hx MM,  BCC, dysplastic nevi .  Luther Redo, CMA, am acting as scribe for [name], MD .  Documentation: I have reviewed the above documentation for accuracy and completeness, and I agree with the above.  [name], MD

## 2020-12-20 ENCOUNTER — Encounter: Payer: Self-pay | Admitting: Dermatology

## 2021-01-05 DIAGNOSIS — G3184 Mild cognitive impairment, so stated: Secondary | ICD-10-CM | POA: Diagnosis not present

## 2021-01-05 DIAGNOSIS — E538 Deficiency of other specified B group vitamins: Secondary | ICD-10-CM | POA: Diagnosis not present

## 2021-01-19 ENCOUNTER — Encounter: Payer: PPO | Admitting: Dermatology

## 2021-02-20 DIAGNOSIS — R399 Unspecified symptoms and signs involving the genitourinary system: Secondary | ICD-10-CM | POA: Diagnosis not present

## 2021-02-28 ENCOUNTER — Other Ambulatory Visit: Payer: Self-pay | Admitting: Internal Medicine

## 2021-02-28 DIAGNOSIS — Z1231 Encounter for screening mammogram for malignant neoplasm of breast: Secondary | ICD-10-CM

## 2021-03-02 DIAGNOSIS — E7800 Pure hypercholesterolemia, unspecified: Secondary | ICD-10-CM | POA: Diagnosis not present

## 2021-03-02 DIAGNOSIS — E538 Deficiency of other specified B group vitamins: Secondary | ICD-10-CM | POA: Diagnosis not present

## 2021-03-02 DIAGNOSIS — Z79899 Other long term (current) drug therapy: Secondary | ICD-10-CM | POA: Diagnosis not present

## 2021-03-02 DIAGNOSIS — I10 Essential (primary) hypertension: Secondary | ICD-10-CM | POA: Diagnosis not present

## 2021-03-02 DIAGNOSIS — R7309 Other abnormal glucose: Secondary | ICD-10-CM | POA: Diagnosis not present

## 2021-04-27 ENCOUNTER — Ambulatory Visit
Admission: RE | Admit: 2021-04-27 | Discharge: 2021-04-27 | Disposition: A | Discharge status: Home / Self Care | Payer: PPO | Source: Ambulatory Visit | Attending: Internal Medicine | Admitting: Internal Medicine

## 2021-04-27 ENCOUNTER — Other Ambulatory Visit: Payer: Self-pay

## 2021-04-27 DIAGNOSIS — Z1231 Encounter for screening mammogram for malignant neoplasm of breast: Secondary | ICD-10-CM

## 2021-05-03 DIAGNOSIS — H43812 Vitreous degeneration, left eye: Secondary | ICD-10-CM | POA: Diagnosis not present

## 2021-05-09 ENCOUNTER — Other Ambulatory Visit: Payer: Self-pay | Admitting: Obstetrics and Gynecology

## 2021-06-20 DIAGNOSIS — R7309 Other abnormal glucose: Secondary | ICD-10-CM | POA: Diagnosis not present

## 2021-06-20 DIAGNOSIS — I10 Essential (primary) hypertension: Secondary | ICD-10-CM | POA: Diagnosis not present

## 2021-06-20 DIAGNOSIS — E7800 Pure hypercholesterolemia, unspecified: Secondary | ICD-10-CM | POA: Diagnosis not present

## 2021-06-20 DIAGNOSIS — R413 Other amnesia: Secondary | ICD-10-CM | POA: Diagnosis not present

## 2021-06-20 DIAGNOSIS — Z79899 Other long term (current) drug therapy: Secondary | ICD-10-CM | POA: Diagnosis not present

## 2021-06-20 DIAGNOSIS — M810 Age-related osteoporosis without current pathological fracture: Secondary | ICD-10-CM | POA: Diagnosis not present

## 2021-06-20 DIAGNOSIS — E538 Deficiency of other specified B group vitamins: Secondary | ICD-10-CM | POA: Diagnosis not present

## 2021-06-20 DIAGNOSIS — Z23 Encounter for immunization: Secondary | ICD-10-CM | POA: Diagnosis not present

## 2021-06-21 DIAGNOSIS — H18822 Corneal disorder due to contact lens, left eye: Secondary | ICD-10-CM | POA: Diagnosis not present

## 2021-06-29 ENCOUNTER — Other Ambulatory Visit: Payer: Self-pay

## 2021-06-29 ENCOUNTER — Ambulatory Visit: Payer: PPO | Admitting: Dermatology

## 2021-06-29 DIAGNOSIS — Z86006 Personal history of melanoma in-situ: Secondary | ICD-10-CM

## 2021-06-29 DIAGNOSIS — D229 Melanocytic nevi, unspecified: Secondary | ICD-10-CM

## 2021-06-29 DIAGNOSIS — D2262 Melanocytic nevi of left upper limb, including shoulder: Secondary | ICD-10-CM

## 2021-06-29 DIAGNOSIS — Z86018 Personal history of other benign neoplasm: Secondary | ICD-10-CM | POA: Diagnosis not present

## 2021-06-29 DIAGNOSIS — Z1283 Encounter for screening for malignant neoplasm of skin: Secondary | ICD-10-CM

## 2021-06-29 DIAGNOSIS — D2372 Other benign neoplasm of skin of left lower limb, including hip: Secondary | ICD-10-CM | POA: Diagnosis not present

## 2021-06-29 DIAGNOSIS — L821 Other seborrheic keratosis: Secondary | ICD-10-CM

## 2021-06-29 DIAGNOSIS — L578 Other skin changes due to chronic exposure to nonionizing radiation: Secondary | ICD-10-CM | POA: Diagnosis not present

## 2021-06-29 DIAGNOSIS — L814 Other melanin hyperpigmentation: Secondary | ICD-10-CM

## 2021-06-29 DIAGNOSIS — D1800 Hemangioma unspecified site: Secondary | ICD-10-CM

## 2021-06-29 DIAGNOSIS — L820 Inflamed seborrheic keratosis: Secondary | ICD-10-CM

## 2021-06-29 DIAGNOSIS — D492 Neoplasm of unspecified behavior of bone, soft tissue, and skin: Secondary | ICD-10-CM

## 2021-06-29 DIAGNOSIS — D239 Other benign neoplasm of skin, unspecified: Secondary | ICD-10-CM

## 2021-06-29 NOTE — Progress Notes (Deleted)
   Follow-Up Visit   Subjective  Jessica Morrison is a 75 y.o. female who presents for the following: Annual Exam (Mole check ).    The following portions of the chart were reviewed this encounter and updated as appropriate:       Review of Systems:  No other skin or systemic complaints except as noted in HPI or Assessment and Plan.  Objective  Well appearing patient in no apparent distress; mood and affect are within normal limits.  [id number] full examination was performed including scalp, head, eyes, ears, nose, lips, neck, chest, axillae, abdomen, back, buttocks, bilateral upper extremities, bilateral lower extremities, hands, feet, fingers, toes, fingernails, and toenails. All findings within normal limits unless otherwise noted below."}    Assessment & Plan   No follow-ups on file.

## 2021-06-29 NOTE — Progress Notes (Signed)
Follow-Up Visit   Subjective  Jessica Morrison is a 75 y.o. female who presents for the following: Annual Exam (Mole check ). 6 months f/u hx of Melanoma in situ, hx of Dysplastic nevus  The patient presents for Total-Body Skin Exam (TBSE) for skin cancer screening and mole check.   The following portions of the chart were reviewed this encounter and updated as appropriate:   Tobacco  Allergies  Meds  Problems  Med Hx  Surg Hx  Fam Hx     Review of Systems:  No other skin or systemic complaints except as noted in HPI or Assessment and Plan.  Objective  Well appearing patient in no apparent distress; mood and affect are within normal limits.  A full examination was performed including scalp, head, eyes, ears, nose, lips, neck, chest, axillae, abdomen, back, buttocks, bilateral upper extremities, bilateral lower extremities, hands, feet, fingers, toes, fingernails, and toenails. All findings within normal limits unless otherwise noted below.  right temple, right tricep   (2) (2) Erythematous keratotic or waxy stuck-on papule or plaque.   left anterior deltoid 0.6 cm irregular brown macule        left medial thigh Firm pink/brown papulenodule with dimple sign.    Assessment & Plan  Inflamed seborrheic keratosis right temple, right tricep   (2)  Destruction of lesion - right temple, right tricep   (2) Complexity: simple   Destruction method: cryotherapy   Informed consent: discussed and consent obtained   Timeout:  patient name, date of birth, surgical site, and procedure verified Lesion destroyed using liquid nitrogen: Yes   Region frozen until ice ball extended beyond lesion: Yes   Outcome: patient tolerated procedure well with no complications   Post-procedure details: wound care instructions given    Neoplasm of skin left anterior deltoid  Epidermal / dermal shaving  Lesion diameter (cm):  0.6 Informed consent: discussed and consent obtained   Timeout:  patient name, date of birth, surgical site, and procedure verified   Procedure prep:  Patient was prepped and draped in usual sterile fashion Prep type:  Isopropyl alcohol Anesthesia: the lesion was anesthetized in a standard fashion   Anesthetic:  1% lidocaine w/ epinephrine 1-100,000 buffered w/ 8.4% NaHCO3 Hemostasis achieved with: pressure, aluminum chloride and electrodesiccation   Outcome: patient tolerated procedure well   Post-procedure details: sterile dressing applied and wound care instructions given   Dressing type: bandage and petrolatum    Specimen 1 - Surgical pathology Differential Diagnosis: R/O Dysplastic nevus  Check Margins: No  Dermatofibroma left medial thigh  Benign-appearing.  Observation.  Call clinic for new or changing moles.  Recommend daily use of broad spectrum spf 30+ sunscreen to sun-exposed areas.    Skin cancer screening  Lentigines - Scattered tan macules - Due to sun exposure - Benign-appearing, observe - Recommend daily broad spectrum sunscreen SPF 30+ to sun-exposed areas, reapply every 2 hours as needed. - Call for any changes  Seborrheic Keratoses - Stuck-on, waxy, tan-brown papules and/or plaques  - Benign-appearing - Discussed benign etiology and prognosis. - Observe - Call for any changes  Melanocytic Nevi - Tan-brown and/or pink-flesh-colored symmetric macules and papules - Benign appearing on exam today - Observation - Call clinic for new or changing moles - Recommend daily use of broad spectrum spf 30+ sunscreen to sun-exposed areas.   Hemangiomas - Red papules - Discussed benign nature - Observe - Call for any changes  Actinic Damage - Chronic condition, secondary to cumulative  UV/sun exposure - diffuse scaly erythematous macules with underlying dyspigmentation - Recommend daily broad spectrum sunscreen SPF 30+ to sun-exposed areas, reapply every 2 hours as needed.  - Staying in the shade or wearing long sleeves, sun  glasses (UVA+UVB protection) and wide brim hats (4-inch brim around the entire circumference of the hat) are also recommended for sun protection.  - Call for new or changing lesions.  History of Dysplastic Nevi Multiple see history  - No evidence of recurrence today - Recommend regular full body skin exams - Recommend daily broad spectrum sunscreen SPF 30+ to sun-exposed areas, reapply every 2 hours as needed.  - Call if any new or changing lesions are noted between office visits   History of Melanoma in situ 07/03/2018 Right lateral neck inferior to right angle of mandible  - No evidence of recurrence today - No lymphadenopathy - Recommend regular full body skin exams - Recommend daily broad spectrum sunscreen SPF 30+ to sun-exposed areas, reapply every 2 hours as needed.  - Call if any new or changing lesions are noted between office visits  Skin cancer screening performed today.   Return in about 6 months (around 12/27/2021) for TBSE, hx of Melanoma in situ .  IMarye Round, CMA, am acting as scribe for [name], MD .  Documentation: I have reviewed the above documentation for accuracy and completeness, and I agree with the above.  [name], MD

## 2021-06-29 NOTE — Patient Instructions (Addendum)
Wound Care Instructions  Cleanse wound gently with soap and water once a day then pat dry with clean gauze. Apply a thing coat of Petrolatum (petroleum jelly, "Vaseline") over the wound (unless you have an allergy to this). We recommend that you use a new, sterile tube of Vaseline. Do not pick or remove scabs. Do not remove the yellow or white "healing tissue" from the base of the wound.  Cover the wound with fresh, clean, nonstick gauze and secure with paper tape. You may use Band-Aids in place of gauze and tape if the would is small enough, but would recommend trimming much of the tape off as there is often too much. Sometimes Band-Aids can irritate the skin.  You should call the office for your biopsy report after 1 week if you have not already been contacted.  If you experience any problems, such as abnormal amounts of bleeding, swelling, significant bruising, significant pain, or evidence of infection, please call the office immediately.  FOR ADULT SURGERY PATIENTS: If you need something for pain relief you may take 1 extra strength Tylenol (acetaminophen) AND 2 Ibuprofen (200mg each) together every 4 hours as needed for pain. (do not take these if you are allergic to them or if you have a reason you should not take them.) Typically, you may only need pain medication for 1 to 3 days.    Cryotherapy Aftercare  Wash gently with soap and water everyday.   Apply Vaseline and Band-Aid daily until healed.    If you have any questions or concerns for your doctor, please call our main line at [phone] and press option 4 to reach your doctor's medical assistant. If no one answers, please leave a voicemail as directed and we will return your call as soon as possible. Messages left after 4 pm will be answered the following business day.   You may also send us a message via MyChart. We typically respond to MyChart messages within 1-2 business days.  For prescription refills, please ask your  pharmacy to contact our office. Our fax number is [phone].  If you have an urgent issue when the clinic is closed that cannot wait until the next business day, you can page your doctor at the number below.    Please note that while we do our best to be available for urgent issues outside of office hours, we are not available 24/7.   If you have an urgent issue and are unable to reach us, you may choose to seek medical care at your doctor's office, retail clinic, urgent care center, or emergency room.  If you have a medical emergency, please immediately call 911 or go to the emergency department.  Pager Numbers  - Dr. Kowalski: [phone]  - Dr. Moye: [phone]  - Dr. Stewart: [phone]  In the event of inclement weather, please call our main line at [phone] for an update on the status of any delays or closures.  Dermatology Medication Tips: Please keep the boxes that topical medications come in in order to help keep track of the instructions about where and how to use these. Pharmacies typically print the medication instructions only on the boxes and not directly on the medication tubes.   If your medication is too expensive, please contact our office at [phone] option 4 or send us a message through MyChart.   We are unable to tell what your co-pay for medications will be in advance as this is different depending on your insurance coverage. However,   we may be able to find a substitute medication at lower cost or fill out paperwork to get insurance to cover a needed medication.   If a prior authorization is required to get your medication covered by your insurance company, please allow us 1-2 business days to complete this process.  Drug prices often vary depending on where the prescription is filled and some pharmacies may offer cheaper prices.  The website www.goodrx.com contains coupons for medications through different pharmacies. The prices here do not  account for what the cost may be with help from insurance (it may be cheaper with your insurance), but the website can give you the price if you did not use any insurance.  - You can print the associated coupon and take it with your prescription to the pharmacy.  - You may also stop by our office during regular business hours and pick up a GoodRx coupon card.  - If you need your prescription sent electronically to a different pharmacy, notify our office through  MyChart or by phone at [phone] option 4.  

## 2021-07-02 ENCOUNTER — Encounter: Payer: Self-pay | Admitting: Dermatology

## 2021-07-07 ENCOUNTER — Telehealth: Payer: Self-pay

## 2021-07-07 NOTE — Telephone Encounter (Signed)
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

## 2021-07-07 NOTE — Telephone Encounter (Signed)
Left pt msg to call for bx results/sh 

## 2021-07-10 ENCOUNTER — Telehealth: Payer: Self-pay

## 2021-07-10 NOTE — Telephone Encounter (Signed)
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

## 2021-07-10 NOTE — Telephone Encounter (Signed)
Spoke with pt and informed her of her results and scheduled excision. She had no concerns.

## 2021-07-11 DIAGNOSIS — E538 Deficiency of other specified B group vitamins: Secondary | ICD-10-CM | POA: Diagnosis not present

## 2021-07-11 DIAGNOSIS — G3184 Mild cognitive impairment, so stated: Secondary | ICD-10-CM | POA: Diagnosis not present

## 2021-08-15 ENCOUNTER — Ambulatory Visit: Payer: PPO | Admitting: Dermatology

## 2021-08-15 ENCOUNTER — Encounter: Payer: Self-pay | Admitting: Dermatology

## 2021-08-15 ENCOUNTER — Other Ambulatory Visit: Payer: Self-pay

## 2021-08-15 ENCOUNTER — Telehealth: Payer: Self-pay

## 2021-08-15 DIAGNOSIS — D2262 Melanocytic nevi of left upper limb, including shoulder: Secondary | ICD-10-CM | POA: Diagnosis not present

## 2021-08-15 DIAGNOSIS — D492 Neoplasm of unspecified behavior of bone, soft tissue, and skin: Secondary | ICD-10-CM

## 2021-08-15 MED ORDER — MUPIROCIN 2 % EX OINT
1.0000 | TOPICAL_OINTMENT | Freq: Every day | CUTANEOUS | 1 refills | Status: DC
Start: 2021-08-15 — End: 2022-01-25

## 2021-08-15 NOTE — Progress Notes (Signed)
   Follow-Up Visit   Subjective  Jessica Morrison is a 75 y.o. female who presents for the following: Moderated to severe dysplastic nevus bx proven (L ant deltoid, pt presents for excision).  The following portions of the chart were reviewed this encounter and updated as appropriate:   Tobacco  Allergies  Meds  Problems  Med Hx  Surg Hx  Fam Hx     Review of Systems:  No other skin or systemic complaints except as noted in HPI or Assessment and Plan.  Objective  Well appearing patient in no apparent distress; mood and affect are within normal limits.  A focused examination was performed including Left arm. Relevant physical exam findings are noted in the Assessment and Plan.  L anterior deltoid Pink bx site 1.1 x 0.6cm   Assessment & Plan  Neoplasm of skin L anterior deltoid  mupirocin ointment (BACTROBAN) 2 % Apply 1 application topically daily. Qd to excision site  Skin excision  Lesion length (cm):  1.1 Lesion width (cm):  0.6 Margin per side (cm):  0.2 Total excision diameter (cm):  1.5 Informed consent: discussed and consent obtained   Timeout: patient name, date of birth, surgical site, and procedure verified   Procedure prep:  Patient was prepped and draped in usual sterile fashion Prep type:  Isopropyl alcohol and povidone-iodine Anesthesia: the lesion was anesthetized in a standard fashion   Anesthetic:  1% lidocaine w/ epinephrine 1-100,000 buffered w/ 8.4% NaHCO3 (3cc lido w/ epi, 1cc bupivicaine, Total of 4cc) Instrument used comment:  #15c blade Hemostasis achieved with: pressure   Hemostasis achieved with comment:  Electrocautery Outcome: patient tolerated procedure well with no complications   Post-procedure details: sterile dressing applied and wound care instructions given   Dressing type: bandage, pressure dressing and bacitracin (Mupirocin)    Skin repair Complexity:  Complex Final length (cm):  3 Reason for type of repair: reduce tension to  allow closure, reduce the risk of dehiscence, infection, and necrosis, reduce subcutaneous dead space and avoid a hematoma, allow closure of the large defect, preserve normal anatomy, preserve normal anatomical and functional relationships and enhance both functionality and cosmetic results   Undermining: area extensively undermined   Undermining comment:  Undermining Defect 1.5cm Subcutaneous layers (deep stitches):  Suture size:  3-0 Suture type: Vicryl (polyglactin 910)   Subcutaneous suture technique: Inverted Dermal. Fine/surface layer approximation (top stitches):  Suture size:  4-0 Suture type: nylon   Stitches: simple running   Suture removal (days):  7 Hemostasis achieved with: pressure Outcome: patient tolerated procedure well with no complications   Post-procedure details: sterile dressing applied and wound care instructions given   Dressing type: bandage, pressure dressing and bacitracin (Mupirocin)    Specimen 1 - Surgical pathology Differential Diagnosis: D48.5 Bx proven moderate to severe dysplastic nevus  Check Margins: yes Pink bx site 1.1 x 0.6 [id number]  Moderate to severe dysplastic nevus bx proven  Start Mupirocin oint qd to excision site  Return in about 1 week (around 08/22/2021) for suture removal.  I, [name], RMA, am acting as scribe for [name], MD . Documentation: I have reviewed the above documentation for accuracy and completeness, and I agree with the above.  [name], MD

## 2021-08-15 NOTE — Telephone Encounter (Signed)
Patient doing fine after today's surgery./sh 

## 2021-08-15 NOTE — Patient Instructions (Signed)
Wound Care Instructions  Cleanse wound gently with soap and water once a day then pat dry with clean gauze. Apply a thing coat of Petrolatum (petroleum jelly, "Vaseline") over the wound (unless you have an allergy to this). We recommend that you use a new, sterile tube of Vaseline. Do not pick or remove scabs. Do not remove the yellow or white "healing tissue" from the base of the wound.  Cover the wound with fresh, clean, nonstick gauze and secure with paper tape. You may use Band-Aids in place of gauze and tape if the would is small enough, but would recommend trimming much of the tape off as there is often too much. Sometimes Band-Aids can irritate the skin.  You should call the office for your biopsy report after 1 week if you have not already been contacted.  If you experience any problems, such as abnormal amounts of bleeding, swelling, significant bruising, significant pain, or evidence of infection, please call the office immediately.  FOR ADULT SURGERY PATIENTS: If you need something for pain relief you may take 1 extra strength Tylenol (acetaminophen) AND 2 Ibuprofen (200mg each) together every 4 hours as needed for pain. (do not take these if you are allergic to them or if you have a reason you should not take them.) Typically, you may only need pain medication for 1 to 3 days.   If you have any questions or concerns for your doctor, please call our main line at [phone] and press option 4 to reach your doctor's medical assistant. If no one answers, please leave a voicemail as directed and we will return your call as soon as possible. Messages left after 4 pm will be answered the following business day.   You may also send us a message via MyChart. We typically respond to MyChart messages within 1-2 business days.  For prescription refills, please ask your pharmacy to contact our office. Our fax number is [phone].  If you have an urgent issue when the clinic is closed that  cannot wait until the next business day, you can page your doctor at the number below.    Please note that while we do our best to be available for urgent issues outside of office hours, we are not available 24/7.   If you have an urgent issue and are unable to reach us, you may choose to seek medical care at your doctor's office, retail clinic, urgent care center, or emergency room.  If you have a medical emergency, please immediately call 911 or go to the emergency department.  Pager Numbers  - Dr. Kowalski: [phone]  - Dr. Moye: [phone]  - Dr. Stewart: [phone]  In the event of inclement weather, please call our main line at [phone] for an update on the status of any delays or closures.  Dermatology Medication Tips: Please keep the boxes that topical medications come in in order to help keep track of the instructions about where and how to use these. Pharmacies typically print the medication instructions only on the boxes and not directly on the medication tubes.   If your medication is too expensive, please contact our office at [phone] option 4 or send us a message through MyChart.   We are unable to tell what your co-pay for medications will be in advance as this is different depending on your insurance coverage. However, we may be able to find a substitute medication at lower cost or fill out paperwork to get insurance to cover a needed   medication.   If a prior authorization is required to get your medication covered by your insurance company, please allow us 1-2 business days to complete this process.  Drug prices often vary depending on where the prescription is filled and some pharmacies may offer cheaper prices.  The website www.goodrx.com contains coupons for medications through different pharmacies. The prices here do not account for what the cost may be with help from insurance (it may be cheaper with your insurance), but the website can give you the  price if you did not use any insurance.  - You can print the associated coupon and take it with your prescription to the pharmacy.  - You may also stop by our office during regular business hours and pick up a GoodRx coupon card.  - If you need your prescription sent electronically to a different pharmacy, notify our office through [facility] MyChart or by phone at [phone] option 4.   

## 2021-08-21 ENCOUNTER — Ambulatory Visit
Admission: RE | Admit: 2021-08-21 | Discharge: 2021-08-21 | Disposition: A | Discharge status: Home / Self Care | Payer: PPO | Source: Ambulatory Visit | Attending: Family Medicine | Admitting: Family Medicine

## 2021-08-21 ENCOUNTER — Other Ambulatory Visit: Payer: Self-pay | Admitting: Family Medicine

## 2021-08-21 ENCOUNTER — Other Ambulatory Visit: Payer: Self-pay

## 2021-08-21 DIAGNOSIS — R109 Unspecified abdominal pain: Secondary | ICD-10-CM | POA: Insufficient documentation

## 2021-08-21 DIAGNOSIS — K8020 Calculus of gallbladder without cholecystitis without obstruction: Secondary | ICD-10-CM | POA: Diagnosis not present

## 2021-08-21 DIAGNOSIS — I700 Atherosclerosis of aorta: Secondary | ICD-10-CM | POA: Diagnosis not present

## 2021-08-21 DIAGNOSIS — R8290 Unspecified abnormal findings in urine: Secondary | ICD-10-CM | POA: Diagnosis not present

## 2021-08-21 DIAGNOSIS — R110 Nausea: Secondary | ICD-10-CM | POA: Insufficient documentation

## 2021-08-21 DIAGNOSIS — N1330 Unspecified hydronephrosis: Secondary | ICD-10-CM | POA: Diagnosis not present

## 2021-08-21 DIAGNOSIS — R1032 Left lower quadrant pain: Secondary | ICD-10-CM

## 2021-08-21 DIAGNOSIS — K5730 Diverticulosis of large intestine without perforation or abscess without bleeding: Secondary | ICD-10-CM | POA: Diagnosis not present

## 2021-08-22 ENCOUNTER — Ambulatory Visit (INDEPENDENT_AMBULATORY_CARE_PROVIDER_SITE_OTHER): Payer: PPO | Admitting: Dermatology

## 2021-08-22 DIAGNOSIS — Z86018 Personal history of other benign neoplasm: Secondary | ICD-10-CM

## 2021-08-22 DIAGNOSIS — Z4802 Encounter for removal of sutures: Secondary | ICD-10-CM

## 2021-08-22 DIAGNOSIS — R8290 Unspecified abnormal findings in urine: Secondary | ICD-10-CM | POA: Diagnosis not present

## 2021-08-23 NOTE — Patient Instructions (Signed)

## 2021-08-23 NOTE — Progress Notes (Signed)
   Follow-Up Visit   Subjective  Jessica Morrison is a 75 y.o. female who presents for the following: post op/suture removal (L ant deltoid - margins free severely dysplastic nevus).  The following portions of the chart were reviewed this encounter and updated as appropriate:   Tobacco  Allergies  Meds  Problems  Med Hx  Surg Hx  Fam Hx     Review of Systems:  No other skin or systemic complaints except as noted in HPI or Assessment and Plan.  Objective  Well appearing patient in no apparent distress; mood and affect are within normal limits.  A focused examination was performed including the L arm. Relevant physical exam findings are noted in the Assessment and Plan.  L ant deltoid Healing excision site.   Assessment & Plan  History of dysplastic nevus L ant deltoid  Encounter for Removal of Sutures - Incision site at the L ant deltoid is clean, dry and intact - Wound cleansed, sutures removed, wound cleansed and steri strips applied.  - Discussed pathology results showing margins free severely dysplastic nevus.  - Patient advised to keep steri-strips dry until they fall off. - Scars remodel for a full year. - Once steri-strips fall off, patient can apply over-the-counter silicone scar cream each night to help with scar remodeling if desired. - Patient advised to call with any concerns or if they notice any new or changing lesions.   Return for appointment as scheduled.  Luther Redo, CMA, am acting as scribe for [name], MD . Documentation: I have reviewed the above documentation for accuracy and completeness, and I agree with the above.  [name], MD

## 2021-08-24 ENCOUNTER — Encounter: Payer: Self-pay | Admitting: Dermatology

## 2021-08-25 DIAGNOSIS — R7989 Other specified abnormal findings of blood chemistry: Secondary | ICD-10-CM | POA: Diagnosis not present

## 2021-09-12 ENCOUNTER — Ambulatory Visit (INDEPENDENT_AMBULATORY_CARE_PROVIDER_SITE_OTHER): Payer: PPO | Admitting: Obstetrics and Gynecology

## 2021-09-12 ENCOUNTER — Encounter: Payer: Self-pay | Admitting: Obstetrics and Gynecology

## 2021-09-12 ENCOUNTER — Other Ambulatory Visit: Payer: Self-pay

## 2021-09-12 VITALS — BP 167/83 | HR 71 | Ht <= 58 in | Wt 118.0 lb

## 2021-09-12 DIAGNOSIS — Z01419 Encounter for gynecological examination (general) (routine) without abnormal findings: Secondary | ICD-10-CM

## 2021-09-12 NOTE — Progress Notes (Signed)
HPI:      [name] is a 75 y.o. G2P1101 who LMP was No LMP recorded. Patient has had a hysterectomy.  Subjective:   She presents today for her annual examination.  She is up-to-date on her mammography. She reports that her dementia is being treated with medication and she seems to be doing well at this time. She continues to take Os-Cal, vitamin D, and calcium for osteoporosis. She has a concern that her urine stream is not really a stream but tends to spray.  This is a significant concern for her because both she and her mother have been diagnosed with melanoma.  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 (Mother died of complications from melanoma)    Hx: The following portions of the patient's history were reviewed and updated as appropriate:             She  has a past medical history of Back pain, Basal cell carcinoma (08/04/2020), Cancer ([place]), Curvature of spine, Dysplastic nevus (04/30/2013), Dysplastic nevus (05/04/2014), Dysplastic nevus (03/26/2019), Dysplastic nevus (10/29/2019), Dysplastic nevus (03/31/2020, 05/12/20 shave removal), Dysplastic nevus (06/29/2021), Endometriosis, Endometriosis, Fibrocystic disease of breast, Fibroid, GERD (gastroesophageal reflux disease), HLD (hyperlipidemia), Hyperlipemia, Hypertension, Kidney stone, 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 She does not have any pertinent problems on file. She  has a past surgical history that includes Abdominal hysterectomy; Oophorectomy; Colonoscopy with propofol (N/A, 05/09/2015); Cesarean section; Bilateral oophorectomy (1999); and Melanoma excision (Right, 07/29/2018). Her family history includes Emphysema in her father. She  reports that she has never smoked. She has never used smokeless tobacco. She reports that she does not drink alcohol and does not use  drugs. She has a current medication list which includes the following prescription(s): aspirin, aspirin ec, calcium-vitamin d, cholecalciferol, cyanocobalamin, donepezil, gabapentin, hm vitamin d3, hydrocortisone, meloxicam, memantine, mupirocin ointment, mupirocin ointment, omeprazole, raloxifene, rosuvastatin, and donepezil. She is allergic to codeine, neosporin wound cleanser [benzalkonium chloride], and penicillins.       Review of Systems:  Review of Systems  Constitutional: Denied constitutional symptoms, night sweats, recent illness, fatigue, fever, insomnia and weight loss.  Eyes: Denied eye symptoms, eye pain, photophobia, vision change and visual disturbance.  Ears/Nose/Throat/Neck: Denied ear, nose, throat or neck symptoms, hearing loss, nasal discharge, sinus congestion and sore throat.  Cardiovascular: Denied cardiovascular symptoms, arrhythmia, chest pain/pressure, edema, exercise intolerance, orthopnea and palpitations.  Respiratory: Denied pulmonary symptoms, asthma, pleuritic pain, productive sputum, cough, dyspnea and wheezing.  Gastrointestinal: Denied, gastro-esophageal reflux, melena, nausea and vomiting.  Genitourinary: See HPI for additional information.  Musculoskeletal: Denied musculoskeletal symptoms, stiffness, swelling, muscle weakness and myalgia.  Dermatologic: Denied dermatology symptoms, rash and scar.  Neurologic: Denied neurology symptoms, dizziness, headache, neck pain and syncope.  Psychiatric: Denied psychiatric symptoms, anxiety and depression.  Endocrine: Denied endocrine symptoms including hot flashes and night sweats.   Meds:   Current Outpatient Medications on File Prior to Visit  Medication Sig Dispense Refill   aspirin 81 MG chewable tablet      aspirin EC 81 MG tablet Take 81 mg by mouth daily.     calcium-vitamin D (OSCAL WITH D) 500-200 MG-UNIT tablet Take by mouth.     Cholecalciferol 2000 UNITS CAPS Take 1 capsule by mouth.      cyanocobalamin 1000 MCG tablet Take by mouth.     donepezil (ARICEPT) 10 MG tablet TAKE 1  TABLET BY MOUTH ONCE EVERY EVENING     gabapentin (NEURONTIN) 100 MG capsule Take 1 capsule by mouth at bedtime.     HM VITAMIN D3 100 MCG (4000 UT) CAPS      hydrocortisone 2.5 % cream      meloxicam (MOBIC) 7.5 MG tablet Take by mouth.     memantine (NAMENDA) 5 MG tablet Take 5 mg by mouth 2 (two) times daily.     mupirocin ointment (BACTROBAN) 2 % Apply 1 application topically daily. Qd to wound on left leg 22 g 0   mupirocin ointment (BACTROBAN) 2 % Apply 1 application topically daily. Qd to excision site 22 g 1   omeprazole (PRILOSEC) 40 MG capsule      raloxifene (EVISTA) 60 MG tablet TAKE 1 TABLET BY MOUTH ONCE A DAY 90 tablet 1   rosuvastatin (CRESTOR) 5 MG tablet Take 5 mg by mouth daily.     donepezil (ARICEPT) 10 MG tablet Take by mouth.     No current facility-administered medications on file prior to visit.      Objective:     Vitals:   09/12/21 0952  BP: (!) 167/83  Pulse: 71    Filed Weights   09/12/21 0952  Weight: 118 lb (53.5 kg)              Physical examination General NAD, Conversant  HEENT Atraumatic; Op clear with mmm.  Normo-cephalic. Pupils reactive. Anicteric sclerae  Thyroid/Neck Smooth without nodularity or enlargement. Normal ROM.  Neck Supple.  Skin No rashes, lesions or ulceration. Normal palpated skin turgor. No nodularity.  Breasts: No masses or discharge.  Symmetric.  No axillary adenopathy.  Lungs: Clear to auscultation.No rales or wheezes. Normal Respiratory effort, no retractions.  Heart: NSR.  No murmurs or rubs appreciated. No periferal edema  Abdomen: Soft.  Non-tender.  No masses.  No HSM. No hernia  Extremities: Moves all appropriately.  Normal ROM for age. No lymphadenopathy.  Neuro: Oriented to PPT.  Normal mood. Normal affect.     Pelvic:   Vulva: Normal appearance.  No lesions.  Anterior labia fall together and obstruct the urethra.  No  significant obstruction or effusion present.  Vagina: No lesions or abnormalities noted.  Support: Normal pelvic support.  Urethra No masses tenderness or scarring.  Meatus Normal size without lesions or prolapse.  Cervix: Surgically absent   Anus: Normal exam.  No lesions.  Perineum: Normal exam.  No lesions.        Bimanual   Uterus: Surgically absent   Adnexae: No masses.  Non-tender to palpation.  Cul-de-sac: Negative for abnormality.     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 Well woman exam with routine gynecological exam     She is doing well.  I find no issues with regarding the possibility of vulvar or labial melanoma.  I believe her urine stream is slightly blocked by the labia causing the spraying affect that she describes.  It is not truly blocked it just prevents a straight stream.  Plan:            1.  Basic Screening Recommendations The basic screening recommendations for asymptomatic women were discussed with the patient during her visit.  The age-appropriate recommendations were discussed with her and the rational for the tests reviewed.  When I am informed by the patient that another primary care physician has previously obtained the age-appropriate tests and they are up-to-date, only outstanding tests are ordered and referrals given as necessary.  Abnormal results of tests will be discussed with her when all of her results are completed.  Routine preventative health maintenance measures emphasized:  Exercise/Diet/Weight control, Tobacco Warnings, Alcohol/Substance use risks and Stress Management Patient up-to-date on mammography. -Blood work through PCP.  Orders No orders of the defined types were placed in this encounter.   No orders of the defined types were placed in this encounter.        F/U  Return in about 1 year (around 09/12/2022) for Annual Physical.  Finis Bud, M.D. 09/12/2021 10:24 AM

## 2021-10-11 DIAGNOSIS — E010 Iodine-deficiency related diffuse (endemic) goiter: Secondary | ICD-10-CM | POA: Diagnosis not present

## 2021-10-11 DIAGNOSIS — R413 Other amnesia: Secondary | ICD-10-CM | POA: Diagnosis not present

## 2021-10-11 DIAGNOSIS — R7309 Other abnormal glucose: Secondary | ICD-10-CM | POA: Diagnosis not present

## 2021-10-11 DIAGNOSIS — Z79899 Other long term (current) drug therapy: Secondary | ICD-10-CM | POA: Diagnosis not present

## 2021-10-11 DIAGNOSIS — E7800 Pure hypercholesterolemia, unspecified: Secondary | ICD-10-CM | POA: Diagnosis not present

## 2021-10-11 DIAGNOSIS — Z23 Encounter for immunization: Secondary | ICD-10-CM | POA: Diagnosis not present

## 2021-10-11 DIAGNOSIS — I10 Essential (primary) hypertension: Secondary | ICD-10-CM | POA: Diagnosis not present

## 2021-10-27 DIAGNOSIS — R309 Painful micturition, unspecified: Secondary | ICD-10-CM | POA: Diagnosis not present

## 2021-11-03 ENCOUNTER — Other Ambulatory Visit: Payer: Self-pay | Admitting: Obstetrics and Gynecology

## 2021-12-28 ENCOUNTER — Ambulatory Visit: Payer: PPO | Admitting: Dermatology

## 2022-01-09 DIAGNOSIS — E7800 Pure hypercholesterolemia, unspecified: Secondary | ICD-10-CM | POA: Diagnosis not present

## 2022-01-09 DIAGNOSIS — E538 Deficiency of other specified B group vitamins: Secondary | ICD-10-CM | POA: Diagnosis not present

## 2022-01-09 DIAGNOSIS — Z0000 Encounter for general adult medical examination without abnormal findings: Secondary | ICD-10-CM | POA: Diagnosis not present

## 2022-01-09 DIAGNOSIS — F0390 Unspecified dementia without behavioral disturbance: Secondary | ICD-10-CM | POA: Diagnosis not present

## 2022-01-09 DIAGNOSIS — Z79899 Other long term (current) drug therapy: Secondary | ICD-10-CM | POA: Diagnosis not present

## 2022-01-09 DIAGNOSIS — R7309 Other abnormal glucose: Secondary | ICD-10-CM | POA: Diagnosis not present

## 2022-01-09 DIAGNOSIS — R8290 Unspecified abnormal findings in urine: Secondary | ICD-10-CM | POA: Diagnosis not present

## 2022-01-09 DIAGNOSIS — I10 Essential (primary) hypertension: Secondary | ICD-10-CM | POA: Diagnosis not present

## 2022-01-09 DIAGNOSIS — R413 Other amnesia: Secondary | ICD-10-CM | POA: Diagnosis not present

## 2022-01-09 DIAGNOSIS — N1832 Chronic kidney disease, stage 3b: Secondary | ICD-10-CM | POA: Diagnosis not present

## 2022-01-25 ENCOUNTER — Ambulatory Visit: Payer: PPO | Admitting: Dermatology

## 2022-01-25 DIAGNOSIS — D1800 Hemangioma unspecified site: Secondary | ICD-10-CM

## 2022-01-25 DIAGNOSIS — L814 Other melanin hyperpigmentation: Secondary | ICD-10-CM

## 2022-01-25 DIAGNOSIS — L821 Other seborrheic keratosis: Secondary | ICD-10-CM

## 2022-01-25 DIAGNOSIS — D229 Melanocytic nevi, unspecified: Secondary | ICD-10-CM

## 2022-01-25 DIAGNOSIS — L570 Actinic keratosis: Secondary | ICD-10-CM

## 2022-01-25 DIAGNOSIS — Z86006 Personal history of melanoma in-situ: Secondary | ICD-10-CM

## 2022-01-25 DIAGNOSIS — L578 Other skin changes due to chronic exposure to nonionizing radiation: Secondary | ICD-10-CM

## 2022-01-25 DIAGNOSIS — Z85828 Personal history of other malignant neoplasm of skin: Secondary | ICD-10-CM | POA: Diagnosis not present

## 2022-01-25 DIAGNOSIS — Z1283 Encounter for screening for malignant neoplasm of skin: Secondary | ICD-10-CM | POA: Diagnosis not present

## 2022-01-25 DIAGNOSIS — L820 Inflamed seborrheic keratosis: Secondary | ICD-10-CM

## 2022-01-25 DIAGNOSIS — Z86018 Personal history of other benign neoplasm: Secondary | ICD-10-CM | POA: Diagnosis not present

## 2022-01-25 NOTE — Progress Notes (Signed)
? ?Follow-Up Visit ?  ?Subjective  ?Jessica Morrison is a 76 y.o. female who presents for the following: Follow-up (Patient has history of isk, aks, bcc, dermatofibroma and melanoma. ). ?The patient presents for Total-Body Skin Exam (TBSE) for skin cancer screening and mole check.  The patient has spots, moles and lesions to be evaluated, some may be new or changing and the patient has concerns that these could be cancer. ? ?The following portions of the chart were reviewed this encounter and updated as appropriate:  Tobacco  Allergies  Meds  Problems  Med Hx  Surg Hx  Fam Hx   ?  ?Review of Systems: No other skin or systemic complaints except as noted in HPI or Assessment and Plan. ? ?Objective  ?Well appearing patient in no apparent distress; mood and affect are within normal limits. ? ?A full examination was performed including scalp, head, eyes, ears, nose, lips, neck, chest, axillae, abdomen, back, buttocks, bilateral upper extremities, bilateral lower extremities, hands, feet, fingers, toes, fingernails, and toenails. All findings within normal limits unless otherwise noted below. ? ?left upper lip x 1 ?Erythematous thin papules/macules with gritty scale.  ? ?left forearm x 1 ?Erythematous stuck-on, waxy papule or plaque ? ? ?Assessment & Plan  ?Actinic keratosis ?left upper lip x 1 ?Recheck at next 6 month follow up ? ?Actinic keratoses are precancerous spots that appear secondary to cumulative UV radiation exposure/sun exposure over time. They are chronic with expected duration over 1 year. A portion of actinic keratoses will progress to squamous cell carcinoma of the skin. It is not possible to reliably predict which spots will progress to skin cancer and so treatment is recommended to prevent development of skin cancer. ? ?Recommend daily broad spectrum sunscreen SPF 30+ to sun-exposed areas, reapply every 2 hours as needed.  ?Recommend staying in the shade or wearing long sleeves, sun glasses  (UVA+UVB protection) and wide brim hats (4-inch brim around the entire circumference of the hat). ?Call for new or changing lesions. ? ?Destruction of lesion - left upper lip x 1 ?Complexity: simple   ?Destruction method: cryotherapy   ?Informed consent: discussed and consent obtained   ?Timeout:  patient name, date of birth, surgical site, and procedure verified ?Lesion destroyed using liquid nitrogen: Yes   ?Region frozen until ice ball extended beyond lesion: Yes   ?Outcome: patient tolerated procedure well with no complications   ?Post-procedure details: wound care instructions given   ?Additional details:  Prior to procedure, discussed risks of blister formation, small wound, skin dyspigmentation, or rare scar following cryotherapy. Recommend Vaseline ointment to treated areas while healing. ? ?Inflamed seborrheic keratosis ?left forearm x 1 ?Irritated and bothers patient  ? ?Destruction of lesion - left forearm x 1 ?Complexity: simple   ?Destruction method: cryotherapy   ?Informed consent: discussed and consent obtained   ?Timeout:  patient name, date of birth, surgical site, and procedure verified ?Lesion destroyed using liquid nitrogen: Yes   ?Region frozen until ice ball extended beyond lesion: Yes   ?Outcome: patient tolerated procedure well with no complications   ?Post-procedure details: wound care instructions given   ?Additional details:  Prior to procedure, discussed risks of blister formation, small wound, skin dyspigmentation, or rare scar following cryotherapy. Recommend Vaseline ointment to treated areas while healing. ? ?Skin cancer screening ? ?Lentigines ?- Scattered tan macules ?- Due to sun exposure ?- Benign-appearing, observe ?- Recommend daily broad spectrum sunscreen SPF 30+ to sun-exposed areas, reapply every 2 hours as  needed. ?- Call for any changes ? ?Seborrheic Keratoses ?- Stuck-on, waxy, tan-brown papules and/or plaques  ?- Benign-appearing ?- Discussed benign etiology and  prognosis. ?- Observe ?- Call for any changes ? ?Melanocytic Nevi ?- Tan-brown and/or pink-flesh-colored symmetric macules and papules ?- Benign appearing on exam today ?- Observation ?- Call clinic for new or changing moles ?- Recommend daily use of broad spectrum spf 30+ sunscreen to sun-exposed areas.  ? ?Hemangiomas ?- Red papules ?- Discussed benign nature ?- Observe ?- Call for any changes ? ?Actinic Damage ?- Chronic condition, secondary to cumulative UV/sun exposure ?- diffuse scaly erythematous macules with underlying dyspigmentation ?- Recommend daily broad spectrum sunscreen SPF 30+ to sun-exposed areas, reapply every 2 hours as needed.  ?- Staying in the shade or wearing long sleeves, sun glasses (UVA+UVB protection) and wide brim hats (4-inch brim around the entire circumference of the hat) are also recommended for sun protection.  ?- Call for new or changing lesions. ? ?History of Dysplastic Nevi ?- No evidence of recurrence today multiple locations see history  ?- Recommend regular full body skin exams ?- Recommend daily broad spectrum sunscreen SPF 30+ to sun-exposed areas, reapply every 2 hours as needed.  ?- Call if any new or changing lesions are noted between office visits ? ?History of Basal Cell Carcinoma of the Skin ?- No evidence of recurrence today 0.5 cm above the lt mid brow 2021 ?- Recommend regular full body skin exams ?- Recommend daily broad spectrum sunscreen SPF 30+ to sun-exposed areas, reapply every 2 hours as needed.  ?- Call if any new or changing lesions are noted between office visits ? ?History of Melanoma in situ  ?- No evidence of recurrence today at right lateral neck inferior to right angle of mandible (2019) ?- No lymphadenopathy ?- Recommend regular full body skin exams ?- Recommend daily broad spectrum sunscreen SPF 30+ to sun-exposed areas, reapply every 2 hours as needed.  ?- Call if any new or changing lesions are noted between office visits ? ?Skin cancer screening  performed today. ?Return for 6 month tbse . ?Garry Heater, CMA, am acting as scribe for [name], MD. ?Documentation: I have reviewed the above documentation for accuracy and completeness, and I agree with the above. ? ?[name], MD ? ?

## 2022-01-25 NOTE — Patient Instructions (Addendum)
Actinic keratoses are precancerous spots that appear secondary to cumulative UV radiation exposure/sun exposure over time. They are chronic with expected duration over 1 year. A portion of actinic keratoses will progress to squamous cell carcinoma of the skin. It is not possible to reliably predict which spots will progress to skin cancer and so treatment is recommended to prevent development of skin cancer. ? ?Recommend daily broad spectrum sunscreen SPF 30+ to sun-exposed areas, reapply every 2 hours as needed.  ?Recommend staying in the shade or wearing long sleeves, sun glasses (UVA+UVB protection) and wide brim hats (4-inch brim around the entire circumference of the hat). ?Call for new or changing lesions.  ? ?Cryotherapy Aftercare ? ?Wash gently with soap and water everyday.   ?Apply Vaseline and Band-Aid daily until healed.  ? ? ?Melanoma ABCDEs ? ?Melanoma is the most dangerous type of skin cancer, and is the leading cause of death from skin disease.  You are more likely to develop melanoma if you: ?Have light-colored skin, light-colored eyes, or red or blond hair ?Spend a lot of time in the sun ?Tan regularly, either outdoors or in a tanning bed ?Have had blistering sunburns, especially during childhood ?Have a close family member who has had a melanoma ?Have atypical moles or large birthmarks ? ?Early detection of melanoma is key since treatment is typically straightforward and cure rates are extremely high if we catch it early.  ? ?The first sign of melanoma is often a change in a mole or a new dark spot.  The ABCDE system is a way of remembering the signs of melanoma. ? ?A for asymmetry:  The two halves do not match. ?B for border:  The edges of the growth are irregular. ?C for color:  A mixture of colors are present instead of an even brown color. ?D for diameter:  Melanomas are usually (but not always) greater than 55m - the size of a pencil eraser. ?E for evolution:  The spot keeps changing in size,  shape, and color. ? ?Please check your skin once per month between visits. You can use a small mirror in front and a large mirror behind you to keep an eye on the back side or your body.  ? ?If you see any new or changing lesions before your next follow-up, please call to schedule a visit. ? ?Please continue daily skin protection including broad spectrum sunscreen SPF 30+ to sun-exposed areas, reapplying every 2 hours as needed when you're outdoors.  ? ?Staying in the shade or wearing long sleeves, sun glasses (UVA+UVB protection) and wide brim hats (4-inch brim around the entire circumference of the hat) are also recommended for sun protection.   ? ? ? ?If You Need Anything After Your Visit ? ?If you have any questions or concerns for your doctor, please call our main line at [phone] press option 4 to reach your doctor's medical assistant. If no one answers, please leave a voicemail as directed and we will return your call as soon as possible. Messages left after 4 pm will be answered the following business day.  ? ?You may also send [place] message via MyChart. We typically respond to MyChart messages within 1-2 business days. ? ?For prescription refills, please ask your pharmacy to contact our office. Our fax number is [phone] ? ?If you have an urgent issue when the clinic is closed that cannot wait until the next business day, you can page your doctor at the number below.   ? ?  Please note that while we do our best to be available for urgent issues outside of office hours, we are not available 24/7.  ? ?If you have an urgent issue and are unable to reach [place], you may choose to seek medical care at your doctor's office, retail clinic, urgent care center, or emergency room. ? ?If you have a medical emergency, please immediately call 911 or go to the emergency department. ? ?Pager Numbers ? ?- Dr. Nehemiah Massed: [phone] ? ?- Dr. Laurence Ferrari: [phone] ? ?- Dr. Nicole Kindred: [phone] ? ?In the event of  inclement weather, please call our main line at [phone] for an update on the status of any delays or closures. ? ?Dermatology Medication Tips: ?Please keep the boxes that topical medications come in in order to help keep track of the instructions about where and how to use these. Pharmacies typically print the medication instructions only on the boxes and not directly on the medication tubes.  ? ?If your medication is too expensive, please contact our office at [phone] option 4 or send [place] a message through [place].  ? ?We are unable to tell what your co-pay for medications will be in advance as this is different depending on your insurance coverage. However, we may be able to find a substitute medication at lower cost or fill out paperwork to get insurance to cover a needed medication.  ? ?If a prior authorization is required to get your medication covered by your insurance company, please allow [place] 1-2 business days to complete this process. ? ?Drug prices often vary depending on where the prescription is filled and some pharmacies may offer cheaper prices. ? ?The website www.goodrx.com contains coupons for medications through different pharmacies. The prices here do not account for what the cost may be with help from insurance (it may be cheaper with your insurance), but the website can give you the price if you did not use any insurance.  ?- You can print the associated coupon and take it with your prescription to the pharmacy.  ?- You may also stop by our office during regular business hours and pick up a GoodRx coupon card.  ?- If you need your prescription sent electronically to a different pharmacy, notify our office through [facility] or by phone at [phone] option 4. ? ? ? ? ?Si Usted Necesita Algo Despu?s de Su Visita ? ?Tambi?n puede enviarnos un mensaje a trav?s de MyChart. Por lo general respondemos a los mensajes de MyChart en el transcurso de 1 a 2 d?as h?biles. ? ?Para renovar  recetas, por favor pida a su farmacia que se ponga en contacto con nuestra oficina. Nuestro n?mero de fax es el [phone]. ? ?Si tiene un asunto urgente cuando la cl?nica est? cerrada y que no puede esperar hasta el siguiente d?a h?bil, puede llamar/localizar a su doctor(a) al n?mero que aparece a continuaci?n.  ? ?Por favor, tenga en cuenta que aunque hacemos todo lo posible para estar disponibles para asuntos urgentes fuera del horario de oficina, no estamos disponibles las 24 horas del d?a, los 7 d?as de la semana.  ? ?Si tiene un problema urgente y no puede comunicarse con nosotros, puede optar por buscar atenci?n m?dica  en el consultorio de su doctor(a), en una cl?nica privada, en un centro de atenci?n urgente o en una sala de emergencias. ? ?Si tiene Engineer, maintenance (IT) m?dica, por favor llame inmediatamente al 911 o vaya a la sala de emergencias. ? ?N?meros de b?per ? ?- Dr. Nehemiah Massed:  [phone] ? ?- Dra. Moye: [phone] ? ?- Dra. Nicole Kindred: [phone] ? ?En caso de inclemencias del tiempo, por favor llame a nuestra l?nea principal al [phone] para una actualizaci?n sobre el estado de cualquier retraso o cierre. ? ?Consejos para la medicaci?n en dermatolog?a: ?Por favor, guarde las cajas en las que vienen los medicamentos de uso t?pico para ayudarle a seguir las instrucciones sobre d?nde y c?mo usarlos. Las farmacias generalmente imprimen las instrucciones del medicamento s?lo en las cajas y no directamente en los tubos del [place].  ? ?Si su medicamento es muy caro, por favor, p?ngase en contacto con Zigmund Daniel llamando al [phone] y presione la opci?n 4 o env?enos un mensaje a trav?s de MyChart.  ? ?No podemos decirle cu?l ser? su copago por los medicamentos por adelantado ya que esto es diferente dependiendo de la cobertura de su seguro. Sin embargo, es posible que podamos encontrar un medicamento sustituto a Electrical engineer un formulario para que el seguro cubra el medicamento  que se considera necesario.  ? ?Si se requiere Ardelia Mems autorizaci?n previa para que su compa??a de seguros [place] su medicamento, por favor perm?tanos de 1 a 2 d?as h?biles para completar este proceso. ? ?Los precios

## 2022-01-30 ENCOUNTER — Encounter: Payer: Self-pay | Admitting: Dermatology

## 2022-02-20 DIAGNOSIS — R413 Other amnesia: Secondary | ICD-10-CM | POA: Diagnosis not present

## 2022-04-03 DIAGNOSIS — E538 Deficiency of other specified B group vitamins: Secondary | ICD-10-CM | POA: Diagnosis not present

## 2022-04-03 DIAGNOSIS — R413 Other amnesia: Secondary | ICD-10-CM | POA: Diagnosis not present

## 2022-04-03 DIAGNOSIS — R7309 Other abnormal glucose: Secondary | ICD-10-CM | POA: Diagnosis not present

## 2022-04-03 DIAGNOSIS — Z79899 Other long term (current) drug therapy: Secondary | ICD-10-CM | POA: Diagnosis not present

## 2022-04-03 DIAGNOSIS — R8290 Unspecified abnormal findings in urine: Secondary | ICD-10-CM | POA: Diagnosis not present

## 2022-04-03 DIAGNOSIS — E7800 Pure hypercholesterolemia, unspecified: Secondary | ICD-10-CM | POA: Diagnosis not present

## 2022-04-03 DIAGNOSIS — I10 Essential (primary) hypertension: Secondary | ICD-10-CM | POA: Diagnosis not present

## 2022-04-11 DIAGNOSIS — F03A0 Unspecified dementia, mild, without behavioral disturbance, psychotic disturbance, mood disturbance, and anxiety: Secondary | ICD-10-CM | POA: Diagnosis not present

## 2022-04-11 DIAGNOSIS — Z7982 Long term (current) use of aspirin: Secondary | ICD-10-CM | POA: Diagnosis not present

## 2022-04-11 DIAGNOSIS — N809 Endometriosis, unspecified: Secondary | ICD-10-CM | POA: Diagnosis not present

## 2022-04-11 DIAGNOSIS — Z90710 Acquired absence of both cervix and uterus: Secondary | ICD-10-CM | POA: Diagnosis not present

## 2022-04-11 DIAGNOSIS — Z79899 Other long term (current) drug therapy: Secondary | ICD-10-CM | POA: Diagnosis not present

## 2022-04-11 DIAGNOSIS — Z90722 Acquired absence of ovaries, bilateral: Secondary | ICD-10-CM | POA: Diagnosis not present

## 2022-04-11 DIAGNOSIS — I10 Essential (primary) hypertension: Secondary | ICD-10-CM | POA: Diagnosis not present

## 2022-04-11 DIAGNOSIS — Z9181 History of falling: Secondary | ICD-10-CM | POA: Diagnosis not present

## 2022-04-11 DIAGNOSIS — E785 Hyperlipidemia, unspecified: Secondary | ICD-10-CM | POA: Diagnosis not present

## 2022-04-11 DIAGNOSIS — M810 Age-related osteoporosis without current pathological fracture: Secondary | ICD-10-CM | POA: Diagnosis not present

## 2022-04-11 DIAGNOSIS — E538 Deficiency of other specified B group vitamins: Secondary | ICD-10-CM | POA: Diagnosis not present

## 2022-04-11 DIAGNOSIS — K219 Gastro-esophageal reflux disease without esophagitis: Secondary | ICD-10-CM | POA: Diagnosis not present

## 2022-04-11 DIAGNOSIS — F02A0 Dementia in other diseases classified elsewhere, mild, without behavioral disturbance, psychotic disturbance, mood disturbance, and anxiety: Secondary | ICD-10-CM | POA: Diagnosis not present

## 2022-04-11 DIAGNOSIS — M859 Disorder of bone density and structure, unspecified: Secondary | ICD-10-CM | POA: Diagnosis not present

## 2022-04-11 DIAGNOSIS — F801 Expressive language disorder: Secondary | ICD-10-CM | POA: Diagnosis not present

## 2022-04-11 DIAGNOSIS — D374 Neoplasm of uncertain behavior of colon: Secondary | ICD-10-CM | POA: Diagnosis not present

## 2022-04-11 DIAGNOSIS — Z791 Long term (current) use of non-steroidal anti-inflammatories (NSAID): Secondary | ICD-10-CM | POA: Diagnosis not present

## 2022-04-11 DIAGNOSIS — I7300 Raynaud's syndrome without gangrene: Secondary | ICD-10-CM | POA: Diagnosis not present

## 2022-04-11 DIAGNOSIS — N649 Disorder of breast, unspecified: Secondary | ICD-10-CM | POA: Diagnosis not present

## 2022-04-11 DIAGNOSIS — M1990 Unspecified osteoarthritis, unspecified site: Secondary | ICD-10-CM | POA: Diagnosis not present

## 2022-04-18 DIAGNOSIS — I10 Essential (primary) hypertension: Secondary | ICD-10-CM | POA: Diagnosis not present

## 2022-04-18 DIAGNOSIS — N809 Endometriosis, unspecified: Secondary | ICD-10-CM | POA: Diagnosis not present

## 2022-04-18 DIAGNOSIS — D374 Neoplasm of uncertain behavior of colon: Secondary | ICD-10-CM | POA: Diagnosis not present

## 2022-04-18 DIAGNOSIS — N649 Disorder of breast, unspecified: Secondary | ICD-10-CM | POA: Diagnosis not present

## 2022-04-18 DIAGNOSIS — I7300 Raynaud's syndrome without gangrene: Secondary | ICD-10-CM | POA: Diagnosis not present

## 2022-04-18 DIAGNOSIS — F801 Expressive language disorder: Secondary | ICD-10-CM | POA: Diagnosis not present

## 2022-04-18 DIAGNOSIS — F03A0 Unspecified dementia, mild, without behavioral disturbance, psychotic disturbance, mood disturbance, and anxiety: Secondary | ICD-10-CM | POA: Diagnosis not present

## 2022-04-20 DIAGNOSIS — F801 Expressive language disorder: Secondary | ICD-10-CM | POA: Diagnosis not present

## 2022-04-20 DIAGNOSIS — Z90710 Acquired absence of both cervix and uterus: Secondary | ICD-10-CM | POA: Diagnosis not present

## 2022-04-20 DIAGNOSIS — M859 Disorder of bone density and structure, unspecified: Secondary | ICD-10-CM | POA: Diagnosis not present

## 2022-04-20 DIAGNOSIS — Z9181 History of falling: Secondary | ICD-10-CM | POA: Diagnosis not present

## 2022-04-20 DIAGNOSIS — Z7982 Long term (current) use of aspirin: Secondary | ICD-10-CM | POA: Diagnosis not present

## 2022-04-20 DIAGNOSIS — F03A0 Unspecified dementia, mild, without behavioral disturbance, psychotic disturbance, mood disturbance, and anxiety: Secondary | ICD-10-CM | POA: Diagnosis not present

## 2022-04-20 DIAGNOSIS — Z90722 Acquired absence of ovaries, bilateral: Secondary | ICD-10-CM | POA: Diagnosis not present

## 2022-04-20 DIAGNOSIS — I7300 Raynaud's syndrome without gangrene: Secondary | ICD-10-CM | POA: Diagnosis not present

## 2022-04-20 DIAGNOSIS — Z79899 Other long term (current) drug therapy: Secondary | ICD-10-CM | POA: Diagnosis not present

## 2022-04-20 DIAGNOSIS — N809 Endometriosis, unspecified: Secondary | ICD-10-CM | POA: Diagnosis not present

## 2022-04-20 DIAGNOSIS — N649 Disorder of breast, unspecified: Secondary | ICD-10-CM | POA: Diagnosis not present

## 2022-04-20 DIAGNOSIS — Z791 Long term (current) use of non-steroidal anti-inflammatories (NSAID): Secondary | ICD-10-CM | POA: Diagnosis not present

## 2022-04-20 DIAGNOSIS — I10 Essential (primary) hypertension: Secondary | ICD-10-CM | POA: Diagnosis not present

## 2022-04-20 DIAGNOSIS — M810 Age-related osteoporosis without current pathological fracture: Secondary | ICD-10-CM | POA: Diagnosis not present

## 2022-04-20 DIAGNOSIS — D374 Neoplasm of uncertain behavior of colon: Secondary | ICD-10-CM | POA: Diagnosis not present

## 2022-04-20 DIAGNOSIS — E785 Hyperlipidemia, unspecified: Secondary | ICD-10-CM | POA: Diagnosis not present

## 2022-04-20 DIAGNOSIS — M1990 Unspecified osteoarthritis, unspecified site: Secondary | ICD-10-CM | POA: Diagnosis not present

## 2022-04-20 DIAGNOSIS — K219 Gastro-esophageal reflux disease without esophagitis: Secondary | ICD-10-CM | POA: Diagnosis not present

## 2022-05-02 ENCOUNTER — Other Ambulatory Visit: Payer: Self-pay | Admitting: Obstetrics and Gynecology

## 2022-05-18 DIAGNOSIS — I10 Essential (primary) hypertension: Secondary | ICD-10-CM | POA: Diagnosis not present

## 2022-05-18 DIAGNOSIS — M810 Age-related osteoporosis without current pathological fracture: Secondary | ICD-10-CM | POA: Diagnosis not present

## 2022-05-18 DIAGNOSIS — F03A0 Unspecified dementia, mild, without behavioral disturbance, psychotic disturbance, mood disturbance, and anxiety: Secondary | ICD-10-CM | POA: Diagnosis not present

## 2022-05-18 DIAGNOSIS — Z90722 Acquired absence of ovaries, bilateral: Secondary | ICD-10-CM | POA: Diagnosis not present

## 2022-05-18 DIAGNOSIS — M859 Disorder of bone density and structure, unspecified: Secondary | ICD-10-CM | POA: Diagnosis not present

## 2022-05-18 DIAGNOSIS — K219 Gastro-esophageal reflux disease without esophagitis: Secondary | ICD-10-CM | POA: Diagnosis not present

## 2022-05-18 DIAGNOSIS — F801 Expressive language disorder: Secondary | ICD-10-CM | POA: Diagnosis not present

## 2022-05-18 DIAGNOSIS — Z7982 Long term (current) use of aspirin: Secondary | ICD-10-CM | POA: Diagnosis not present

## 2022-05-18 DIAGNOSIS — D374 Neoplasm of uncertain behavior of colon: Secondary | ICD-10-CM | POA: Diagnosis not present

## 2022-05-18 DIAGNOSIS — I7300 Raynaud's syndrome without gangrene: Secondary | ICD-10-CM | POA: Diagnosis not present

## 2022-05-18 DIAGNOSIS — M1990 Unspecified osteoarthritis, unspecified site: Secondary | ICD-10-CM | POA: Diagnosis not present

## 2022-05-18 DIAGNOSIS — Z90710 Acquired absence of both cervix and uterus: Secondary | ICD-10-CM | POA: Diagnosis not present

## 2022-05-18 DIAGNOSIS — N649 Disorder of breast, unspecified: Secondary | ICD-10-CM | POA: Diagnosis not present

## 2022-05-18 DIAGNOSIS — Z9181 History of falling: Secondary | ICD-10-CM | POA: Diagnosis not present

## 2022-05-18 DIAGNOSIS — E785 Hyperlipidemia, unspecified: Secondary | ICD-10-CM | POA: Diagnosis not present

## 2022-05-18 DIAGNOSIS — Z79899 Other long term (current) drug therapy: Secondary | ICD-10-CM | POA: Diagnosis not present

## 2022-05-18 DIAGNOSIS — N809 Endometriosis, unspecified: Secondary | ICD-10-CM | POA: Diagnosis not present

## 2022-05-18 DIAGNOSIS — Z791 Long term (current) use of non-steroidal anti-inflammatories (NSAID): Secondary | ICD-10-CM | POA: Diagnosis not present

## 2022-05-24 DIAGNOSIS — G3184 Mild cognitive impairment, so stated: Secondary | ICD-10-CM | POA: Insufficient documentation

## 2022-05-24 DIAGNOSIS — E538 Deficiency of other specified B group vitamins: Secondary | ICD-10-CM | POA: Diagnosis not present

## 2022-05-24 DIAGNOSIS — Z87898 Personal history of other specified conditions: Secondary | ICD-10-CM | POA: Diagnosis not present

## 2022-06-05 DIAGNOSIS — E785 Hyperlipidemia, unspecified: Secondary | ICD-10-CM | POA: Diagnosis not present

## 2022-06-05 DIAGNOSIS — Z7982 Long term (current) use of aspirin: Secondary | ICD-10-CM | POA: Diagnosis not present

## 2022-06-05 DIAGNOSIS — M1990 Unspecified osteoarthritis, unspecified site: Secondary | ICD-10-CM | POA: Diagnosis not present

## 2022-06-05 DIAGNOSIS — Z90710 Acquired absence of both cervix and uterus: Secondary | ICD-10-CM | POA: Diagnosis not present

## 2022-06-05 DIAGNOSIS — D374 Neoplasm of uncertain behavior of colon: Secondary | ICD-10-CM | POA: Diagnosis not present

## 2022-06-05 DIAGNOSIS — Z90722 Acquired absence of ovaries, bilateral: Secondary | ICD-10-CM | POA: Diagnosis not present

## 2022-06-05 DIAGNOSIS — N649 Disorder of breast, unspecified: Secondary | ICD-10-CM | POA: Diagnosis not present

## 2022-06-05 DIAGNOSIS — N809 Endometriosis, unspecified: Secondary | ICD-10-CM | POA: Diagnosis not present

## 2022-06-05 DIAGNOSIS — I10 Essential (primary) hypertension: Secondary | ICD-10-CM | POA: Diagnosis not present

## 2022-06-05 DIAGNOSIS — Z9181 History of falling: Secondary | ICD-10-CM | POA: Diagnosis not present

## 2022-06-05 DIAGNOSIS — K219 Gastro-esophageal reflux disease without esophagitis: Secondary | ICD-10-CM | POA: Diagnosis not present

## 2022-06-05 DIAGNOSIS — F801 Expressive language disorder: Secondary | ICD-10-CM | POA: Diagnosis not present

## 2022-06-05 DIAGNOSIS — Z791 Long term (current) use of non-steroidal anti-inflammatories (NSAID): Secondary | ICD-10-CM | POA: Diagnosis not present

## 2022-06-05 DIAGNOSIS — Z79899 Other long term (current) drug therapy: Secondary | ICD-10-CM | POA: Diagnosis not present

## 2022-06-05 DIAGNOSIS — F03A0 Unspecified dementia, mild, without behavioral disturbance, psychotic disturbance, mood disturbance, and anxiety: Secondary | ICD-10-CM | POA: Diagnosis not present

## 2022-06-05 DIAGNOSIS — I7300 Raynaud's syndrome without gangrene: Secondary | ICD-10-CM | POA: Diagnosis not present

## 2022-06-05 DIAGNOSIS — M859 Disorder of bone density and structure, unspecified: Secondary | ICD-10-CM | POA: Diagnosis not present

## 2022-06-05 DIAGNOSIS — M810 Age-related osteoporosis without current pathological fracture: Secondary | ICD-10-CM | POA: Diagnosis not present

## 2022-06-14 DIAGNOSIS — D374 Neoplasm of uncertain behavior of colon: Secondary | ICD-10-CM | POA: Diagnosis not present

## 2022-06-14 DIAGNOSIS — I10 Essential (primary) hypertension: Secondary | ICD-10-CM | POA: Diagnosis not present

## 2022-06-14 DIAGNOSIS — N649 Disorder of breast, unspecified: Secondary | ICD-10-CM | POA: Diagnosis not present

## 2022-06-14 DIAGNOSIS — I7300 Raynaud's syndrome without gangrene: Secondary | ICD-10-CM | POA: Diagnosis not present

## 2022-06-14 DIAGNOSIS — E538 Deficiency of other specified B group vitamins: Secondary | ICD-10-CM | POA: Diagnosis not present

## 2022-06-14 DIAGNOSIS — F02A0 Dementia in other diseases classified elsewhere, mild, without behavioral disturbance, psychotic disturbance, mood disturbance, and anxiety: Secondary | ICD-10-CM | POA: Diagnosis not present

## 2022-06-14 DIAGNOSIS — F801 Expressive language disorder: Secondary | ICD-10-CM | POA: Diagnosis not present

## 2022-06-27 DIAGNOSIS — Z90722 Acquired absence of ovaries, bilateral: Secondary | ICD-10-CM | POA: Diagnosis not present

## 2022-06-27 DIAGNOSIS — I7300 Raynaud's syndrome without gangrene: Secondary | ICD-10-CM | POA: Diagnosis not present

## 2022-06-27 DIAGNOSIS — N649 Disorder of breast, unspecified: Secondary | ICD-10-CM | POA: Diagnosis not present

## 2022-06-27 DIAGNOSIS — F02A0 Dementia in other diseases classified elsewhere, mild, without behavioral disturbance, psychotic disturbance, mood disturbance, and anxiety: Secondary | ICD-10-CM | POA: Diagnosis not present

## 2022-06-27 DIAGNOSIS — Z9181 History of falling: Secondary | ICD-10-CM | POA: Diagnosis not present

## 2022-06-27 DIAGNOSIS — M810 Age-related osteoporosis without current pathological fracture: Secondary | ICD-10-CM | POA: Diagnosis not present

## 2022-06-27 DIAGNOSIS — M1990 Unspecified osteoarthritis, unspecified site: Secondary | ICD-10-CM | POA: Diagnosis not present

## 2022-06-27 DIAGNOSIS — E538 Deficiency of other specified B group vitamins: Secondary | ICD-10-CM | POA: Diagnosis not present

## 2022-06-27 DIAGNOSIS — D374 Neoplasm of uncertain behavior of colon: Secondary | ICD-10-CM | POA: Diagnosis not present

## 2022-06-27 DIAGNOSIS — E785 Hyperlipidemia, unspecified: Secondary | ICD-10-CM | POA: Diagnosis not present

## 2022-06-27 DIAGNOSIS — Z7982 Long term (current) use of aspirin: Secondary | ICD-10-CM | POA: Diagnosis not present

## 2022-06-27 DIAGNOSIS — K219 Gastro-esophageal reflux disease without esophagitis: Secondary | ICD-10-CM | POA: Diagnosis not present

## 2022-06-27 DIAGNOSIS — Z79899 Other long term (current) drug therapy: Secondary | ICD-10-CM | POA: Diagnosis not present

## 2022-06-27 DIAGNOSIS — I10 Essential (primary) hypertension: Secondary | ICD-10-CM | POA: Diagnosis not present

## 2022-06-27 DIAGNOSIS — N809 Endometriosis, unspecified: Secondary | ICD-10-CM | POA: Diagnosis not present

## 2022-06-27 DIAGNOSIS — Z90710 Acquired absence of both cervix and uterus: Secondary | ICD-10-CM | POA: Diagnosis not present

## 2022-06-27 DIAGNOSIS — F801 Expressive language disorder: Secondary | ICD-10-CM | POA: Diagnosis not present

## 2022-07-05 DIAGNOSIS — R8290 Unspecified abnormal findings in urine: Secondary | ICD-10-CM | POA: Diagnosis not present

## 2022-07-05 DIAGNOSIS — R7309 Other abnormal glucose: Secondary | ICD-10-CM | POA: Diagnosis not present

## 2022-07-05 DIAGNOSIS — E538 Deficiency of other specified B group vitamins: Secondary | ICD-10-CM | POA: Diagnosis not present

## 2022-07-05 DIAGNOSIS — I10 Essential (primary) hypertension: Secondary | ICD-10-CM | POA: Diagnosis not present

## 2022-07-05 DIAGNOSIS — Z23 Encounter for immunization: Secondary | ICD-10-CM | POA: Diagnosis not present

## 2022-07-05 DIAGNOSIS — Z79899 Other long term (current) drug therapy: Secondary | ICD-10-CM | POA: Diagnosis not present

## 2022-07-05 DIAGNOSIS — E782 Mixed hyperlipidemia: Secondary | ICD-10-CM | POA: Diagnosis not present

## 2022-07-05 DIAGNOSIS — R413 Other amnesia: Secondary | ICD-10-CM | POA: Diagnosis not present

## 2022-07-19 DIAGNOSIS — D374 Neoplasm of uncertain behavior of colon: Secondary | ICD-10-CM | POA: Diagnosis not present

## 2022-07-19 DIAGNOSIS — N809 Endometriosis, unspecified: Secondary | ICD-10-CM | POA: Diagnosis not present

## 2022-07-19 DIAGNOSIS — Z79899 Other long term (current) drug therapy: Secondary | ICD-10-CM | POA: Diagnosis not present

## 2022-07-19 DIAGNOSIS — M810 Age-related osteoporosis without current pathological fracture: Secondary | ICD-10-CM | POA: Diagnosis not present

## 2022-07-19 DIAGNOSIS — Z90722 Acquired absence of ovaries, bilateral: Secondary | ICD-10-CM | POA: Diagnosis not present

## 2022-07-19 DIAGNOSIS — E538 Deficiency of other specified B group vitamins: Secondary | ICD-10-CM | POA: Diagnosis not present

## 2022-07-19 DIAGNOSIS — M1990 Unspecified osteoarthritis, unspecified site: Secondary | ICD-10-CM | POA: Diagnosis not present

## 2022-07-19 DIAGNOSIS — K219 Gastro-esophageal reflux disease without esophagitis: Secondary | ICD-10-CM | POA: Diagnosis not present

## 2022-07-19 DIAGNOSIS — I10 Essential (primary) hypertension: Secondary | ICD-10-CM | POA: Diagnosis not present

## 2022-07-19 DIAGNOSIS — Z90710 Acquired absence of both cervix and uterus: Secondary | ICD-10-CM | POA: Diagnosis not present

## 2022-07-19 DIAGNOSIS — N649 Disorder of breast, unspecified: Secondary | ICD-10-CM | POA: Diagnosis not present

## 2022-07-19 DIAGNOSIS — Z7982 Long term (current) use of aspirin: Secondary | ICD-10-CM | POA: Diagnosis not present

## 2022-07-19 DIAGNOSIS — F801 Expressive language disorder: Secondary | ICD-10-CM | POA: Diagnosis not present

## 2022-07-19 DIAGNOSIS — E785 Hyperlipidemia, unspecified: Secondary | ICD-10-CM | POA: Diagnosis not present

## 2022-07-19 DIAGNOSIS — F02A0 Dementia in other diseases classified elsewhere, mild, without behavioral disturbance, psychotic disturbance, mood disturbance, and anxiety: Secondary | ICD-10-CM | POA: Diagnosis not present

## 2022-07-19 DIAGNOSIS — I7300 Raynaud's syndrome without gangrene: Secondary | ICD-10-CM | POA: Diagnosis not present

## 2022-07-19 DIAGNOSIS — Z9181 History of falling: Secondary | ICD-10-CM | POA: Diagnosis not present

## 2022-08-02 ENCOUNTER — Ambulatory Visit: Payer: PPO | Admitting: Dermatology

## 2022-08-02 DIAGNOSIS — L578 Other skin changes due to chronic exposure to nonionizing radiation: Secondary | ICD-10-CM | POA: Diagnosis not present

## 2022-08-02 DIAGNOSIS — D225 Melanocytic nevi of trunk: Secondary | ICD-10-CM

## 2022-08-02 DIAGNOSIS — Z1283 Encounter for screening for malignant neoplasm of skin: Secondary | ICD-10-CM | POA: Diagnosis not present

## 2022-08-02 DIAGNOSIS — Z85828 Personal history of other malignant neoplasm of skin: Secondary | ICD-10-CM | POA: Diagnosis not present

## 2022-08-02 DIAGNOSIS — L820 Inflamed seborrheic keratosis: Secondary | ICD-10-CM

## 2022-08-02 DIAGNOSIS — Z85820 Personal history of malignant melanoma of skin: Secondary | ICD-10-CM

## 2022-08-02 DIAGNOSIS — D229 Melanocytic nevi, unspecified: Secondary | ICD-10-CM

## 2022-08-02 DIAGNOSIS — D692 Other nonthrombocytopenic purpura: Secondary | ICD-10-CM

## 2022-08-02 DIAGNOSIS — D489 Neoplasm of uncertain behavior, unspecified: Secondary | ICD-10-CM

## 2022-08-02 DIAGNOSIS — L814 Other melanin hyperpigmentation: Secondary | ICD-10-CM

## 2022-08-02 DIAGNOSIS — L821 Other seborrheic keratosis: Secondary | ICD-10-CM | POA: Diagnosis not present

## 2022-08-02 DIAGNOSIS — Z86006 Personal history of melanoma in-situ: Secondary | ICD-10-CM

## 2022-08-02 DIAGNOSIS — Z86018 Personal history of other benign neoplasm: Secondary | ICD-10-CM | POA: Diagnosis not present

## 2022-08-02 NOTE — Patient Instructions (Addendum)
  Biopsy Wound Care Instructions  Leave the original bandage on for 24 hours if possible.  If the bandage becomes soaked or soiled before that time, it is OK to remove it and examine the wound.  A small amount of post-operative bleeding is normal.  If excessive bleeding occurs, remove the bandage, place gauze over the site and apply continuous pressure (no peeking) over the area for 30 minutes. If this does not work, please call our clinic as soon as possible or page your doctor if it is after hours.   Once a day, cleanse the wound with soap and water. It is fine to shower. If a thick crust develops you may use a Q-tip dipped into dilute hydrogen peroxide (mix 1:1 with water) to dissolve it.  Hydrogen peroxide can slow the healing process, so use it only as needed.    After washing, apply petroleum jelly (Vaseline) or an antibiotic ointment if your doctor prescribed one for you, followed by a bandage.    For best healing, the wound should be covered with a layer of ointment at all times. If you are not able to keep the area covered with a bandage to hold the ointment in place, this may mean re-applying the ointment several times a day.  Continue this wound care until the wound has healed and is no longer open.   Itching and mild discomfort is normal during the healing process. However, if you develop pain or severe itching, please call our office.   If you have any discomfort, you can take Tylenol (acetaminophen) or ibuprofen as directed on the bottle. (Please do not take these if you have an allergy to them or cannot take them for another reason).  Some redness, tenderness and white or yellow material in the wound is normal healing.  If the area becomes very sore and red, or develops a thick yellow-green material (pus), it may be infected; please notify us.    If you have stitches, return to clinic as directed to have the stitches removed. You will continue wound care for 2-3 days after the  stitches are removed.   Wound healing continues for up to one year following surgery. It is not unusual to experience pain in the scar from time to time during the interval.  If the pain becomes severe or the scar thickens, you should notify the office.    A slight amount of redness in a scar is expected for the first six months.  After six months, the redness will fade and the scar will soften and fade.  The color difference becomes less noticeable with time.  If there are any problems, return for a post-op surgery check at your earliest convenience.  To improve the appearance of the scar, you can use silicone scar gel, cream, or sheets (such as Mederma or Serica) every night for up to one year. These are available over the counter (without a prescription).  Please call our office at [phone] for any questions or concerns.      Seborrheic Keratosis  What causes seborrheic keratoses? Seborrheic keratoses are harmless, common skin growths that first appear during adult life.  As time goes by, more growths appear.  Some people may develop a large number of them.  Seborrheic keratoses appear on both covered and uncovered body parts.  They are not caused by sunlight.  The tendency to develop seborrheic keratoses can be inherited.  They vary in color from skin-colored to gray, brown, or even   black.  They can be either smooth or have a rough, warty surface.   Seborrheic keratoses are superficial and look as if they were stuck on the skin.  Under the microscope this type of keratosis looks like layers upon layers of skin.  That is why at times the top layer may seem to fall off, but the rest of the growth remains and re-grows.    Treatment Seborrheic keratoses do not need to be treated, but can easily be removed in the office.  Seborrheic keratoses often cause symptoms when they rub on clothing or jewelry.  Lesions can be in the way of shaving.  If they become inflamed, they can cause itching,  soreness, or burning.  Removal of a seborrheic keratosis can be accomplished by freezing, burning, or surgery. If any spot bleeds, scabs, or grows rapidly, please return to have it checked, as these can be an indication of a skin cancer.   Cryotherapy Aftercare  Wash gently with soap and water everyday.   Apply Vaseline and Band-Aid daily until healed.    Melanoma ABCDEs  Melanoma is the most dangerous type of skin cancer, and is the leading cause of death from skin disease.  You are more likely to develop melanoma if you: Have light-colored skin, light-colored eyes, or red or blond hair Spend a lot of time in the sun Tan regularly, either outdoors or in a tanning bed Have had blistering sunburns, especially during childhood Have a close family member who has had a melanoma Have atypical moles or large birthmarks  Early detection of melanoma is key since treatment is typically straightforward and cure rates are extremely high if we catch it early.   The first sign of melanoma is often a change in a mole or a new dark spot.  The ABCDE system is a way of remembering the signs of melanoma.  A for asymmetry:  The two halves do not match. B for border:  The edges of the growth are irregular. C for color:  A mixture of colors are present instead of an even brown color. D for diameter:  Melanomas are usually (but not always) greater than 6mm - the size of a pencil eraser. E for evolution:  The spot keeps changing in size, shape, and color.  Please check your skin once per month between visits. You can use a small mirror in front and a large mirror behind you to keep an eye on the back side or your body.   If you see any new or changing lesions before your next follow-up, please call to schedule a visit.  Please continue daily skin protection including broad spectrum sunscreen SPF 30+ to sun-exposed areas, reapplying every 2 hours as needed when you're outdoors.   Staying in the shade or  wearing long sleeves, sun glasses (UVA+UVB protection) and wide brim hats (4-inch brim around the entire circumference of the hat) are also recommended for sun protection.        Due to recent changes in healthcare laws, you may see results of your pathology and/or laboratory studies on MyChart before the doctors have had a chance to review them. We understand that in some cases there may be results that are confusing or concerning to you. Please understand that not all results are received at the same time and often the doctors may need to interpret multiple results in order to provide you with the best plan of care or course of treatment. Therefore, we ask that you   please give us 2 business days to thoroughly review all your results before contacting the office for clarification. Should we see a critical lab result, you will be contacted sooner.   If You Need Anything After Your Visit  If you have any questions or concerns for your doctor, please call our main line at [phone] and press option 4 to reach your doctor's medical assistant. If no one answers, please leave a voicemail as directed and we will return your call as soon as possible. Messages left after 4 pm will be answered the following business day.   You may also send us a message via MyChart. We typically respond to MyChart messages within 1-2 business days.  For prescription refills, please ask your pharmacy to contact our office. Our fax number is [phone].  If you have an urgent issue when the clinic is closed that cannot wait until the next business day, you can page your doctor at the number below.    Please note that while we do our best to be available for urgent issues outside of office hours, we are not available 24/7.   If you have an urgent issue and are unable to reach us, you may choose to seek medical care at your doctor's office, retail clinic, urgent care center, or emergency room.  If you have a medical  emergency, please immediately call 911 or go to the emergency department.  Pager Numbers  - Dr. Kowalski: [phone]  - Dr. Moye: [phone]  - Dr. Stewart: [phone]  In the event of inclement weather, please call our main line at [phone] for an update on the status of any delays or closures.  Dermatology Medication Tips: Please keep the boxes that topical medications come in in order to help keep track of the instructions about where and how to use these. Pharmacies typically print the medication instructions only on the boxes and not directly on the medication tubes.   If your medication is too expensive, please contact our office at [phone] option 4 or send us a message through MyChart.   We are unable to tell what your co-pay for medications will be in advance as this is different depending on your insurance coverage. However, we may be able to find a substitute medication at lower cost or fill out paperwork to get insurance to cover a needed medication.   If a prior authorization is required to get your medication covered by your insurance company, please allow us 1-2 business days to complete this process.  Drug prices often vary depending on where the prescription is filled and some pharmacies may offer cheaper prices.  The website www.goodrx.com contains coupons for medications through different pharmacies. The prices here do not account for what the cost may be with help from insurance (it may be cheaper with your insurance), but the website can give you the price if you did not use any insurance.  - You can print the associated coupon and take it with your prescription to the pharmacy.  - You may also stop by our office during regular business hours and pick up a GoodRx coupon card.  - If you need your prescription sent electronically to a different pharmacy, notify our office through [facility] MyChart or by phone at [phone] option 4.     Si Usted  Necesita Algo Despus de Su Visita  Tambin puede enviarnos un mensaje a travs de MyChart. Por lo general respondemos a los mensajes de MyChart en el transcurso de   1 a 2 das hbiles.  Para renovar recetas, por favor pida a su farmacia que se ponga en contacto con nuestra oficina. Nuestro nmero de fax es el [phone].  Si tiene un asunto urgente cuando la clnica est cerrada y que no puede esperar hasta el siguiente da hbil, puede llamar/localizar a su doctor(a) al nmero que aparece a continuacin.   Por favor, tenga en cuenta que aunque hacemos todo lo posible para estar disponibles para asuntos urgentes fuera del horario de oficina, no estamos disponibles las 24 horas del da, los 7 das de la semana.   Si tiene un problema urgente y no puede comunicarse con nosotros, puede optar por buscar atencin mdica  en el consultorio de su doctor(a), en una clnica privada, en un centro de atencin urgente o en una sala de emergencias.  Si tiene una emergencia mdica, por favor llame inmediatamente al 911 o vaya a la sala de emergencias.  Nmeros de bper  - Dr. Kowalski: [phone]  - Dra. Moye: [phone]  - Dra. Stewart: [phone]  En caso de inclemencias del tiempo, por favor llame a nuestra lnea principal al [phone] para una actualizacin sobre el estado de cualquier retraso o cierre.  Consejos para la medicacin en dermatologa: Por favor, guarde las cajas en las que vienen los medicamentos de uso tpico para ayudarle a seguir las instrucciones sobre dnde y cmo usarlos. Las farmacias generalmente imprimen las instrucciones del medicamento slo en las cajas y no directamente en los tubos del medicamento.   Si su medicamento es muy caro, por favor, pngase en contacto con nuestra oficina llamando al [phone] y presione la opcin 4 o envenos un mensaje a travs de MyChart.   No podemos decirle cul ser su copago por los medicamentos por adelantado ya que esto es  diferente dependiendo de la cobertura de su seguro. Sin embargo, es posible que podamos encontrar un medicamento sustituto a menor costo o llenar un formulario para que el seguro cubra el medicamento que se considera necesario.   Si se requiere una autorizacin previa para que su compaa de seguros cubra su medicamento, por favor permtanos de 1 a 2 das hbiles para completar este proceso.  Los precios de los medicamentos varan con frecuencia dependiendo del lugar de dnde se surte la receta y alguna farmacias pueden ofrecer precios ms baratos.  El sitio web www.goodrx.com tiene cupones para medicamentos de diferentes farmacias. Los precios aqu no tienen en cuenta lo que podra costar con la ayuda del seguro (puede ser ms barato con su seguro), pero el sitio web puede darle el precio si no utiliz ningn seguro.  - Puede imprimir el cupn correspondiente y llevarlo con su receta a la farmacia.  - Tambin puede pasar por nuestra oficina durante el horario de atencin regular y recoger una tarjeta de cupones de GoodRx.  - Si necesita que su receta se enve electrnicamente a una farmacia diferente, informe a nuestra oficina a travs de MyChart de [facility] o por telfono llamando al [phone] y presione la opcin 4.  

## 2022-08-02 NOTE — Progress Notes (Signed)
Follow-Up Visit   Subjective  Jessica Morrison is a 76 y.o. female who presents for the following: Annual Exam (6 mth tbse. Hx of aks, hx of dysplastic nevus, hx isk , hx of bcc, hx of melanoma). The patient presents for Total-Body Skin Exam (TBSE) for skin cancer screening and mole check.  The patient has spots, moles and lesions to be evaluated, some may be new or changing and the patient has concerns that these could be cancer.  The following portions of the chart were reviewed this encounter and updated as appropriate:  Tobacco  Allergies  Meds  Problems  Med Hx  Surg Hx  Fam Hx     Review of Systems: No other skin or systemic complaints except as noted in HPI or Assessment and Plan.  Objective  Well appearing patient in no apparent distress; mood and affect are within normal limits.  A full examination was performed including scalp, head, eyes, ears, nose, lips, neck, chest, axillae, abdomen, back, buttocks, bilateral upper extremities, bilateral lower extremities, hands, feet, fingers, toes, fingernails, and toenails. All findings within normal limits unless otherwise noted below.  left zygoma x 1 Erythematous stuck-on, waxy papule or plaque  left inframammary 0.6 cm flesh papule    Assessment & Plan  Inflamed seborrheic keratosis left zygoma x 1 Symptomatic, irritating, patient would like treated. Destruction of lesion - left zygoma x 1 Complexity: simple   Destruction method: cryotherapy   Informed consent: discussed and consent obtained   Timeout:  patient name, date of birth, surgical site, and procedure verified Lesion destroyed using liquid nitrogen: Yes   Region frozen until ice ball extended beyond lesion: Yes   Outcome: patient tolerated procedure well with no complications   Post-procedure details: wound care instructions given   Additional details:  Prior to procedure, discussed risks of blister formation, small wound, skin dyspigmentation, or rare scar  following cryotherapy. Recommend Vaseline ointment to treated areas while healing.  Neoplasm of uncertain behavior left inframammary Epidermal / dermal shaving  Lesion diameter (cm):  0.6 Informed consent: discussed and consent obtained   Timeout: patient name, date of birth, surgical site, and procedure verified   Procedure prep:  Patient was prepped and draped in usual sterile fashion Prep type:  Isopropyl alcohol Anesthesia: the lesion was anesthetized in a standard fashion   Anesthetic:  1% lidocaine w/ epinephrine 1-100,000 buffered w/ 8.4% NaHCO3 Instrument used: flexible razor blade   Hemostasis achieved with: pressure, aluminum chloride and electrodesiccation   Outcome: patient tolerated procedure well   Post-procedure details: sterile dressing applied and wound care instructions given   Dressing type: bandage and petrolatum    Specimen 1 - Surgical pathology Differential Diagnosis: Irritated nevus r/o dysplasia vs skin tag  Check Margins: no Irritated nevus r/o dysplasia vs skin tag   Lentigines - Scattered tan macules - Due to sun exposure - Benign-appearing, observe - Recommend daily broad spectrum sunscreen SPF 30+ to sun-exposed areas, reapply every 2 hours as needed. - Call for any changes  Seborrheic Keratoses - Stuck-on, waxy, tan-brown papules and/or plaques  - Benign-appearing - Discussed benign etiology and prognosis. - Observe - Call for any changes  Purpura - Chronic; persistent and recurrent.  Treatable, but not curable. At arms  - Violaceous macules and patches - Benign - Related to trauma, age, sun damage and/or use of blood thinners, chronic use of topical and/or oral steroids - Observe - Can use OTC arnica containing moisturizer such as Dermend Bruise Formula  if desired - Call for worsening or other concerns  Melanocytic Nevi - Tan-brown and/or pink-flesh-colored symmetric macules and papules - Benign appearing on exam today - Observation -  Call clinic for new or changing moles - Recommend daily use of broad spectrum spf 30+ sunscreen to sun-exposed areas.   Hemangiomas - Red papules - Discussed benign nature - Observe - Call for any changes  Actinic Damage - Chronic condition, secondary to cumulative UV/sun exposure - diffuse scaly erythematous macules with underlying dyspigmentation - Recommend daily broad spectrum sunscreen SPF 30+ to sun-exposed areas, reapply every 2 hours as needed.  - Staying in the shade or wearing long sleeves, sun glasses (UVA+UVB protection) and wide brim hats (4-inch brim around the entire circumference of the hat) are also recommended for sun protection.  - Call for new or changing lesions.  History of Dysplastic Nevi - No evidence of recurrence today multiple locations see history  - Recommend regular full body skin exams - Recommend daily broad spectrum sunscreen SPF 30+ to sun-exposed areas, reapply every 2 hours as needed.  - Call if any new or changing lesions are noted between office visits  History of Basal Cell Carcinoma of the Skin - No evidence of recurrence today 0.5 cm above the lt mid brow 2021 - Recommend regular full body skin exams - Recommend daily broad spectrum sunscreen SPF 30+ to sun-exposed areas, reapply every 2 hours as needed.  - Call if any new or changing lesions are noted between office visits   History of Melanoma in situ  - No evidence of recurrence today at right lateral neck inferior to right angle of mandible (2019) - No lymphadenopathy - Recommend regular full body skin exams - Recommend daily broad spectrum sunscreen SPF 30+ to sun-exposed areas, reapply every 2 hours as needed.  - Call if any new or changing lesions are noted between office visits   Skin cancer screening performed today. Return in about 6 months (around 02/01/2023) for TBSE hx melanoma. [name], CMA, am acting as scribe for [name], MD. Documentation: I have reviewed  the above documentation for accuracy and completeness, and I agree with the above.  [name], MD

## 2022-08-07 DIAGNOSIS — M1990 Unspecified osteoarthritis, unspecified site: Secondary | ICD-10-CM | POA: Diagnosis not present

## 2022-08-07 DIAGNOSIS — Z79899 Other long term (current) drug therapy: Secondary | ICD-10-CM | POA: Diagnosis not present

## 2022-08-07 DIAGNOSIS — F02A0 Dementia in other diseases classified elsewhere, mild, without behavioral disturbance, psychotic disturbance, mood disturbance, and anxiety: Secondary | ICD-10-CM | POA: Diagnosis not present

## 2022-08-07 DIAGNOSIS — F801 Expressive language disorder: Secondary | ICD-10-CM | POA: Diagnosis not present

## 2022-08-07 DIAGNOSIS — M810 Age-related osteoporosis without current pathological fracture: Secondary | ICD-10-CM | POA: Diagnosis not present

## 2022-08-07 DIAGNOSIS — N649 Disorder of breast, unspecified: Secondary | ICD-10-CM | POA: Diagnosis not present

## 2022-08-07 DIAGNOSIS — Z7982 Long term (current) use of aspirin: Secondary | ICD-10-CM | POA: Diagnosis not present

## 2022-08-07 DIAGNOSIS — E785 Hyperlipidemia, unspecified: Secondary | ICD-10-CM | POA: Diagnosis not present

## 2022-08-07 DIAGNOSIS — Z9181 History of falling: Secondary | ICD-10-CM | POA: Diagnosis not present

## 2022-08-07 DIAGNOSIS — E538 Deficiency of other specified B group vitamins: Secondary | ICD-10-CM | POA: Diagnosis not present

## 2022-08-07 DIAGNOSIS — K219 Gastro-esophageal reflux disease without esophagitis: Secondary | ICD-10-CM | POA: Diagnosis not present

## 2022-08-07 DIAGNOSIS — I10 Essential (primary) hypertension: Secondary | ICD-10-CM | POA: Diagnosis not present

## 2022-08-07 DIAGNOSIS — N809 Endometriosis, unspecified: Secondary | ICD-10-CM | POA: Diagnosis not present

## 2022-08-07 DIAGNOSIS — Z90710 Acquired absence of both cervix and uterus: Secondary | ICD-10-CM | POA: Diagnosis not present

## 2022-08-07 DIAGNOSIS — I7300 Raynaud's syndrome without gangrene: Secondary | ICD-10-CM | POA: Diagnosis not present

## 2022-08-07 DIAGNOSIS — Z90722 Acquired absence of ovaries, bilateral: Secondary | ICD-10-CM | POA: Diagnosis not present

## 2022-08-07 DIAGNOSIS — D374 Neoplasm of uncertain behavior of colon: Secondary | ICD-10-CM | POA: Diagnosis not present

## 2022-08-08 ENCOUNTER — Telehealth: Payer: Self-pay

## 2022-08-08 NOTE — Telephone Encounter (Signed)
-----   Message from Ralene Bathe, MD sent at 08/07/2022  6:29 PM EDT ----- Diagnosis Skin , left inframammary MELANOCYTIC NEVUS, INTRADERMAL TYPE  Benign mole No further treatment needed

## 2022-08-08 NOTE — Telephone Encounter (Signed)
Advised pt of bx result/sh ?

## 2022-08-12 ENCOUNTER — Encounter: Payer: Self-pay | Admitting: Dermatology

## 2022-08-17 DIAGNOSIS — M419 Scoliosis, unspecified: Secondary | ICD-10-CM | POA: Diagnosis not present

## 2022-08-17 DIAGNOSIS — R102 Pelvic and perineal pain: Secondary | ICD-10-CM | POA: Diagnosis not present

## 2022-08-17 DIAGNOSIS — R310 Gross hematuria: Secondary | ICD-10-CM | POA: Diagnosis not present

## 2022-08-23 DIAGNOSIS — M1990 Unspecified osteoarthritis, unspecified site: Secondary | ICD-10-CM | POA: Diagnosis not present

## 2022-08-23 DIAGNOSIS — Z90710 Acquired absence of both cervix and uterus: Secondary | ICD-10-CM | POA: Diagnosis not present

## 2022-08-23 DIAGNOSIS — D374 Neoplasm of uncertain behavior of colon: Secondary | ICD-10-CM | POA: Diagnosis not present

## 2022-08-23 DIAGNOSIS — Z9181 History of falling: Secondary | ICD-10-CM | POA: Diagnosis not present

## 2022-08-23 DIAGNOSIS — Z7982 Long term (current) use of aspirin: Secondary | ICD-10-CM | POA: Diagnosis not present

## 2022-08-23 DIAGNOSIS — F801 Expressive language disorder: Secondary | ICD-10-CM | POA: Diagnosis not present

## 2022-08-23 DIAGNOSIS — Z79899 Other long term (current) drug therapy: Secondary | ICD-10-CM | POA: Diagnosis not present

## 2022-08-23 DIAGNOSIS — E785 Hyperlipidemia, unspecified: Secondary | ICD-10-CM | POA: Diagnosis not present

## 2022-08-23 DIAGNOSIS — M810 Age-related osteoporosis without current pathological fracture: Secondary | ICD-10-CM | POA: Diagnosis not present

## 2022-08-23 DIAGNOSIS — E538 Deficiency of other specified B group vitamins: Secondary | ICD-10-CM | POA: Diagnosis not present

## 2022-08-23 DIAGNOSIS — N649 Disorder of breast, unspecified: Secondary | ICD-10-CM | POA: Diagnosis not present

## 2022-08-23 DIAGNOSIS — K219 Gastro-esophageal reflux disease without esophagitis: Secondary | ICD-10-CM | POA: Diagnosis not present

## 2022-08-23 DIAGNOSIS — N809 Endometriosis, unspecified: Secondary | ICD-10-CM | POA: Diagnosis not present

## 2022-08-23 DIAGNOSIS — I10 Essential (primary) hypertension: Secondary | ICD-10-CM | POA: Diagnosis not present

## 2022-08-23 DIAGNOSIS — I7300 Raynaud's syndrome without gangrene: Secondary | ICD-10-CM | POA: Diagnosis not present

## 2022-08-23 DIAGNOSIS — Z90722 Acquired absence of ovaries, bilateral: Secondary | ICD-10-CM | POA: Diagnosis not present

## 2022-08-23 DIAGNOSIS — F02A0 Dementia in other diseases classified elsewhere, mild, without behavioral disturbance, psychotic disturbance, mood disturbance, and anxiety: Secondary | ICD-10-CM | POA: Diagnosis not present

## 2022-09-01 IMAGING — MR MR HEAD W/O CM
6 of 11 series · 24 of 48 positions shown · non-contrast
Comparison: None

CLINICAL DATA: Memory disturbance.

EXAM:
MRI HEAD WITHOUT CONTRAST
TECHNIQUE: Multiplanar, multiecho pulse sequences of the brain and surrounding
structures were obtained without intravenous contrast.
Additionally, using NeuroQuant software a 3D volumetric analysis of
the brain was performed and is compared to a normative database
adjusted for age, gender and intracranial volume.

[Series 3: DWI · axial · 3.0mm · 0.94mm/px · z∈[-112,+29]mm · 7 of 100 slices shown (1 of 2)]
[im 1/100]
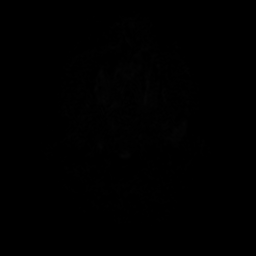
[im 17/100]
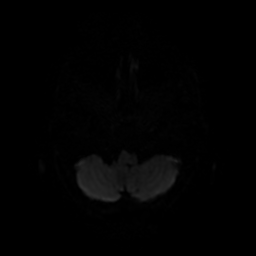
[im 34/100]
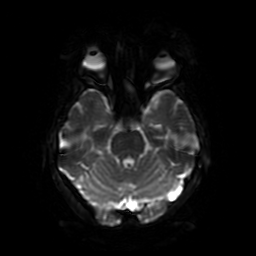
[im 50/100]
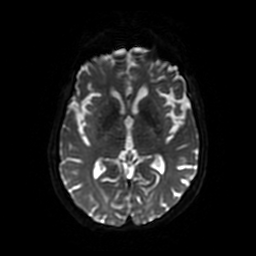
[im 67/100]
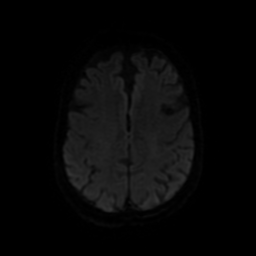
[im 83/100]
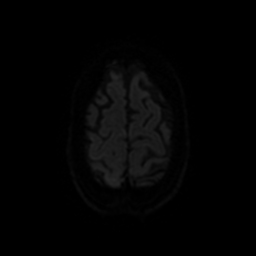
[im 100/100]
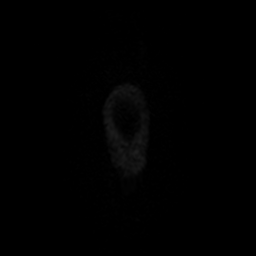

[Series 4: DWI · coronal · 4.0mm · 0.94mm/px · 6 of 74 slices shown (2 of 2)]
[im 1/74]
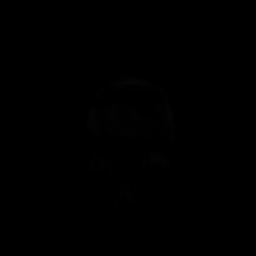
[im 15/74]
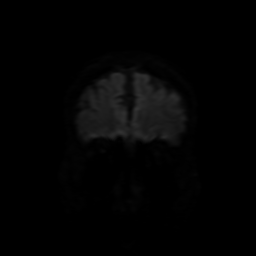
[im 30/74]
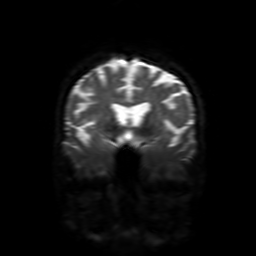
[im 44/74]
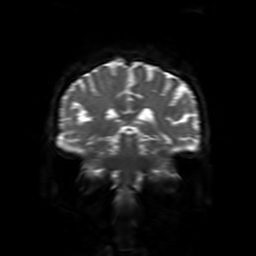
[im 59/74]
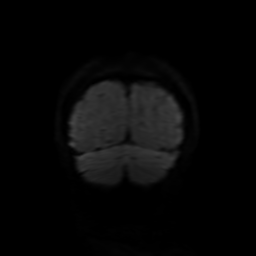
[im 74/74]
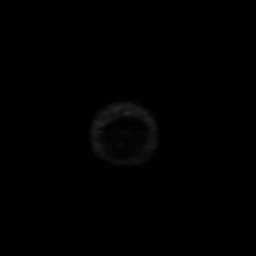

[Series 7: FLAIR · sagittal · 5.0mm · 0.23mm/px · 2 of 25 slices shown (1 of 2)]
[im 1/25]
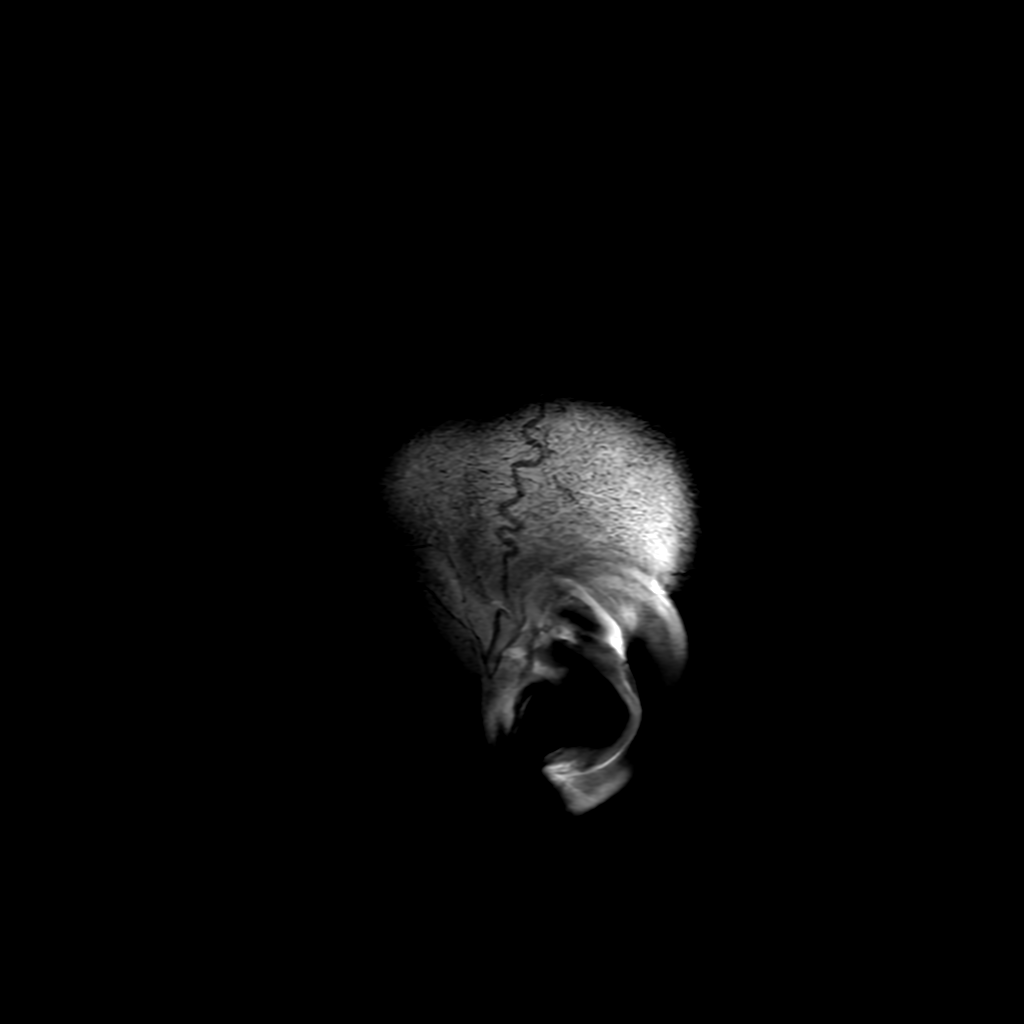
[im 25/25]
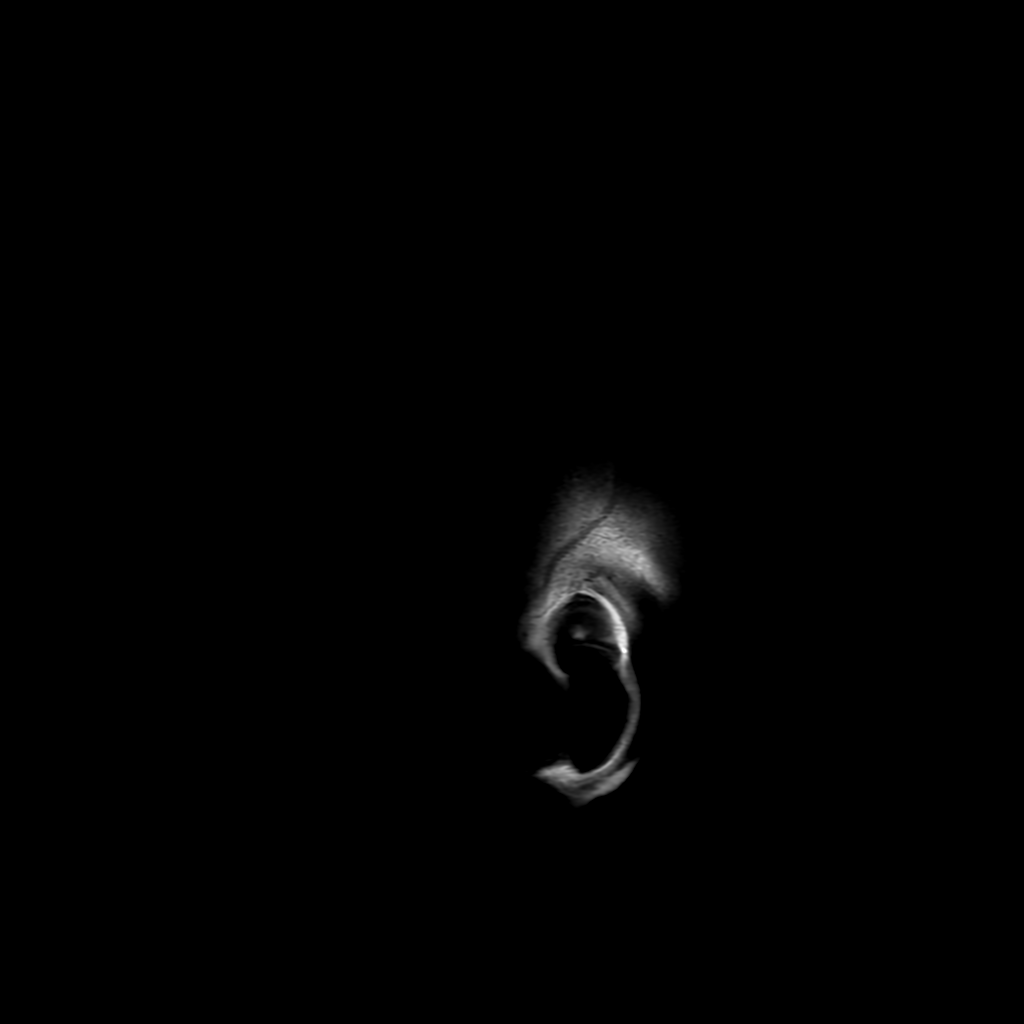

[Series 8: FLAIR · axial · 3.0mm · 0.45mm/px · z∈[-110,+28]mm · 2 of 25 slices shown (2 of 2)]
[im 1/25]
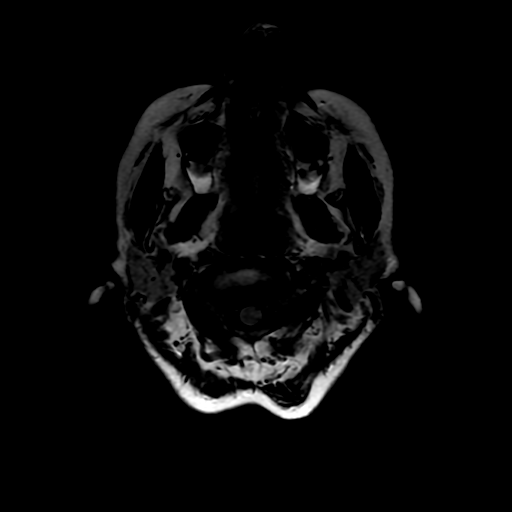
[im 25/25]
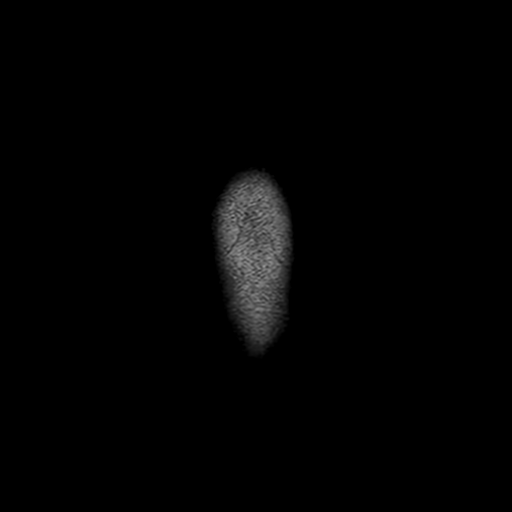

[Series 350: ADC · axial · 3.0mm · 0.94mm/px · z∈[-112,+29]mm · 4 of 50 slices shown (1 of 2)]
[im 1/50]
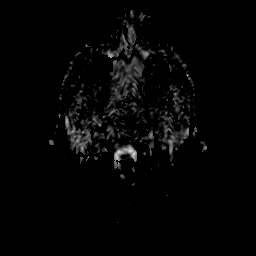
[im 17/50]
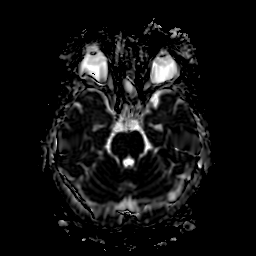
[im 33/50]
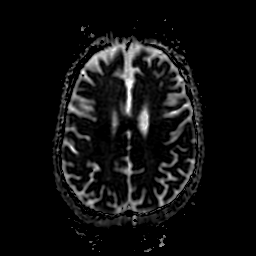
[im 50/50]
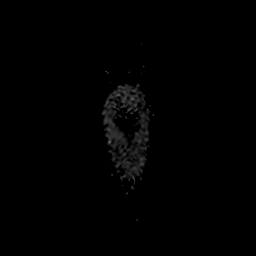

[Series 450: ADC · coronal · 4.0mm · 0.94mm/px · 3 of 37 slices shown (2 of 2)]
[im 1/37]
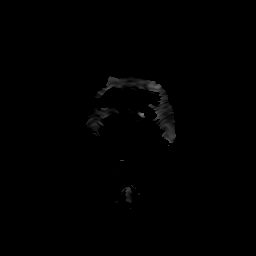
[im 19/37]
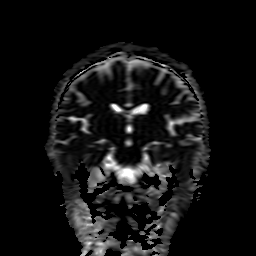
[im 37/37]
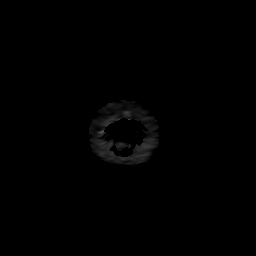

[24 of 48 positions shown; findings below may reference images not displayed]

FINDINGS: Brain: Diffusion imaging does not show any acute or subacute
infarction or other cause of restricted diffusion. No focal
abnormality affects the brainstem or cerebellum. Dilated
perivascular spaces in the mid brain, not pathologic. Cerebral
hemispheres show atrophy as described below. No evidence of
widespread small-vessel disease. There is some subcortical white
matter gliosis in both occipital lobes. There are foci of
hemosiderin deposition scattered prominently throughout the right
occipital lobe and posteromedial temporal lobe and to a lesser
extent in the posterior parietal regions. This pattern raises the
question of previous closed head injury sometime in the distant
past. No cortical or large vessel territory infarction. No mass
lesion, recent hemorrhage, hydrocephalus or extra-axial collection.

Vascular: Major vessels at the base of the brain show flow.

Skull and upper cervical spine: Negative other than degenerative
spondylosis at C3-4.

Sinuses/Orbits: Clear/normal

Other: None

NeuroQuant Findings:

Volumetric analysis of the brain was performed, with a fully
detailed report in [HOSPITAL] PACS. Briefly, the comparison with age and
gender matched reference reveals generalized cortical gray matter
volume loss with preservation of the white matter. Lobar
predominance is not demonstrated.
IMPRESSION: 1. No acute or reversible finding. Generalized cortical gray matter
volume loss with preservation of the white matter.
2. Scattered foci of hemosiderin deposition throughout the right
occipital lobe, right posteromedial temporal lobe and to a lesser
extent both posterior parietal regions. This pattern raises the
possibility of previous closed head injury sometime in the distant
past. No evidence of acute or subacute injury or infarction.
Essential absence small-vessel ischemic changes of the white matter.
3. NeuroQuant volumetric analysis of the brain, see details on
[HOSPITAL] PACS.

## 2022-09-13 ENCOUNTER — Ambulatory Visit (INDEPENDENT_AMBULATORY_CARE_PROVIDER_SITE_OTHER): Payer: PPO | Admitting: Obstetrics and Gynecology

## 2022-09-13 ENCOUNTER — Ambulatory Visit: Payer: PPO | Admitting: Urology

## 2022-09-13 ENCOUNTER — Encounter: Payer: Self-pay | Admitting: Obstetrics and Gynecology

## 2022-09-13 VITALS — BP 146/84 | HR 73 | Ht <= 58 in | Wt 111.5 lb

## 2022-09-13 DIAGNOSIS — Z01419 Encounter for gynecological examination (general) (routine) without abnormal findings: Secondary | ICD-10-CM | POA: Diagnosis not present

## 2022-09-13 DIAGNOSIS — Z87440 Personal history of urinary (tract) infections: Secondary | ICD-10-CM | POA: Diagnosis not present

## 2022-09-13 NOTE — Progress Notes (Signed)
Patients presents for annual exam today. She states trouble with reoccurring UTI's, plans to see urology tomorrow, no current symptoms today. Patient has a history of hysterectomy and has aged out of mammograms. Annual labs are deferred to PCP. She states no other questions or concerns at this time.

## 2022-09-13 NOTE — Progress Notes (Signed)
HPI:      [name] is a 76 y.o. G2P1101 who LMP was No LMP recorded. Patient has had a hysterectomy.  Subjective:   She presents today for her annual examination.  She states that she has had multiple urinary tract infections in the last year.  She has an appointment with urology tomorrow.  She reports that her bladder infections are often accompanied by burning with urination and blood in her urine.  She is otherwise doing well. Of significant note patient has had an abdominal hysterectomy.    Hx: The following portions of the patient's history were reviewed and updated as appropriate:             She  has a past medical history of Back pain, Basal cell carcinoma (08/04/2020), Cancer ([place]), Curvature of spine, Dysplastic nevus (04/30/2013), Dysplastic nevus (05/04/2014), Dysplastic nevus (03/26/2019), Dysplastic nevus (10/29/2019), Dysplastic nevus (03/31/2020, 05/12/20 shave removal), Dysplastic nevus (06/29/2021), Endometriosis, Endometriosis, Fibrocystic disease of breast, Fibroid, GERD (gastroesophageal reflux disease), HLD (hyperlipidemia), Hyperlipemia, Hypertension, Kidney stone, 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 She does not have any pertinent problems on file. She  has a past surgical history that includes Abdominal hysterectomy; Oophorectomy; Colonoscopy with propofol (N/A, 05/09/2015); Cesarean section; Bilateral oophorectomy (1999); and Melanoma excision (Right, 07/29/2018). Her family history includes Emphysema in her father. She  reports that she has never smoked. She has never used smokeless tobacco. She reports that she does not drink alcohol and does not use drugs. She has a current medication list which includes the following prescription(s): aspirin, calcium-vitamin d, cholecalciferol, cyanocobalamin, donepezil, gabapentin, hm vitamin d3, hydrocortisone, memantine,  omeprazole, raloxifene, and rosuvastatin. She is allergic to codeine, neosporin wound cleanser [benzalkonium chloride], and penicillins.       Review of Systems:  Review of Systems  Constitutional: Denied constitutional symptoms, night sweats, recent illness, fatigue, fever, insomnia and weight loss.  Eyes: Denied eye symptoms, eye pain, photophobia, vision change and visual disturbance.  Ears/Nose/Throat/Neck: Denied ear, nose, throat or neck symptoms, hearing loss, nasal discharge, sinus congestion and sore throat.  Cardiovascular: Denied cardiovascular symptoms, arrhythmia, chest pain/pressure, edema, exercise intolerance, orthopnea and palpitations.  Respiratory: Denied pulmonary symptoms, asthma, pleuritic pain, productive sputum, cough, dyspnea and wheezing.  Gastrointestinal: Denied, gastro-esophageal reflux, melena, nausea and vomiting.  Genitourinary: See HPI for additional information.  Musculoskeletal: Denied musculoskeletal symptoms, stiffness, swelling, muscle weakness and myalgia.  Dermatologic: Denied dermatology symptoms, rash and scar.  Neurologic: Denied neurology symptoms, dizziness, headache, neck pain and syncope.  Psychiatric: Denied psychiatric symptoms, anxiety and depression.  Endocrine: Denied endocrine symptoms including hot flashes and night sweats.   Meds:   Current Outpatient Medications on File Prior to Visit  Medication Sig Dispense Refill   aspirin 81 MG chewable tablet      calcium-vitamin D (OSCAL WITH D) 500-200 MG-UNIT tablet Take by mouth.     Cholecalciferol 25 MCG (1000 UT) tablet Take by mouth.     cyanocobalamin 1000 MCG tablet Take by mouth.     donepezil (ARICEPT) 10 MG tablet TAKE 1 TABLET BY MOUTH ONCE EVERY EVENING     gabapentin (NEURONTIN) 100 MG capsule Take 1 capsule by mouth at bedtime. (Patient not taking: Reported on 01/25/2022)     HM VITAMIN D3 100 MCG (4000 UT) CAPS  (Patient not taking: Reported on 01/25/2022)     hydrocortisone  2.5 % cream  (Patient not taking: Reported on 01/25/2022)  memantine (NAMENDA) 5 MG tablet Take 5 mg by mouth 2 (two) times daily.     omeprazole (PRILOSEC) 40 MG capsule      raloxifene (EVISTA) 60 MG tablet TAKE 1 TABLET BY MOUTH ONCE A DAY 90 tablet 1   rosuvastatin (CRESTOR) 5 MG tablet Take 5 mg by mouth daily.     No current facility-administered medications on file prior to visit.     Objective:     Vitals:   09/13/22 0902  BP: (!) 146/84  Pulse: 73    Filed Weights   09/13/22 0902  Weight: 111 lb 8 oz (50.6 kg)             Physical examination General NAD, Conversant  HEENT Atraumatic; Op clear with mmm.  Normo-cephalic. Pupils reactive. Anicteric sclerae  Thyroid/Neck Smooth without nodularity or enlargement. Normal ROM.  Neck Supple.  Skin No rashes, lesions or ulceration. Normal palpated skin turgor. No nodularity.  Breasts: No masses or discharge.  Symmetric.  No axillary adenopathy.  Lungs: Clear to auscultation.No rales or wheezes. Normal Respiratory effort, no retractions.  Heart: NSR.  No murmurs or rubs appreciated. No periferal edema  Abdomen: Soft.  Non-tender.  No masses.  No HSM. No hernia  Extremities: Moves all appropriately.  Normal ROM for age. No lymphadenopathy.  Neuro: Oriented to PPT.  Normal mood. Normal affect.     Pelvic:   Vulva: Normal appearance.  No lesions.   Vagina: No lesions or abnormalities noted.  Moderate vaginal atrophy with small vagina  Support: Normal pelvic support.  Urethra No masses tenderness or scarring.  Meatus Normal size without lesions or prolapse.  Cervix: Surgically absent   Anus: Normal exam.  No lesions.  Perineum: Normal exam.  No lesions.        Bimanual   Uterus: Surgically absent   Adnexae: No masses.  Non-tender to palpation.  Cul-de-sac: Negative for abnormality.       Assessment:    G2P1101 Patient Active Problem List   Diagnosis Date Noted   Skin macule 08/23/2016   Family history of  malignant melanoma 08/23/2016   Endometriosis 08/16/2015   Vaginal atrophy 08/16/2015   Bloodgood disease 04/22/2014   Calculus of kidney 04/22/2014   BP (high blood pressure) 03/16/2014   HLD (hyperlipidemia) 03/16/2014   Arthritis, degenerative 03/16/2014   Osteoporosis 03/16/2014   Raynaud's syndrome without gangrene 03/16/2014   Big thyroid 03/16/2014     1. Well woman exam with routine gynecological exam     History of recurrent urinary tract infections   Plan:            1.  Basic Screening Recommendations The basic screening recommendations for asymptomatic women were discussed with the patient during her visit.  The age-appropriate recommendations were discussed with her and the rational for the tests reviewed.  When I am informed by the patient that another primary care physician has previously obtained the age-appropriate tests and they are up-to-date, only outstanding tests are ordered and referrals given as necessary.  Abnormal results of tests will be discussed with her when all of her results are completed.  Routine preventative health maintenance measures emphasized: Exercise/Diet/Weight control, Tobacco Warnings, Alcohol/Substance use risks and Stress Management 2.  We have briefly discussed the possible use of vaginal estrogen to decrease recurrent urinary tract infection.  I have encouraged her to keep her urology appointment to make sure that there is nothing significantly wrong but to also ask them about their opinion on  vaginal estrogen to help with this problem.  Orders No orders of the defined types were placed in this encounter.   No orders of the defined types were placed in this encounter.         F/U  Return in about 1 year (around 09/14/2023) for Annual Physical.  Finis Bud, M.D. 09/13/2022 10:03 AM

## 2022-09-14 ENCOUNTER — Encounter: Payer: Self-pay | Admitting: Urology

## 2022-09-14 ENCOUNTER — Ambulatory Visit (INDEPENDENT_AMBULATORY_CARE_PROVIDER_SITE_OTHER): Payer: PPO | Admitting: Urology

## 2022-09-14 VITALS — BP 174/79 | HR 84 | Ht <= 58 in | Wt 111.0 lb

## 2022-09-14 DIAGNOSIS — R310 Gross hematuria: Secondary | ICD-10-CM | POA: Diagnosis not present

## 2022-09-14 LAB — URINALYSIS, COMPLETE
Bilirubin, UA: NEGATIVE
Glucose, UA: NEGATIVE
Leukocytes,UA: NEGATIVE
Nitrite, UA: NEGATIVE
Specific Gravity, UA: 1.03 (ref 1.005–1.030)
Urobilinogen, Ur: 1 mg/dL (ref 0.2–1.0)
pH, UA: 6 (ref 5.0–7.5)

## 2022-09-14 LAB — MICROSCOPIC EXAMINATION: RBC, Urine: >30 /hpf — AB (ref 0–2)

## 2022-09-14 NOTE — Progress Notes (Unsigned)
09/14/2022 10:27 AM   Jessica Morrison [birth year] [id number]  Referring provider: Mortimer Fries, PA-C [facility] [place],  [place] [address]  Chief Complaint  Patient presents with   New Patient (Initial Visit)   Hematuria    HPI: Jessica Morrison is a 76 y.o. female referred for evaluation of gross hematuria.  She presents today with her daughter.  3 year history of intermittent gross hematuria No anticoagulant/antiplatelet medications Intermittent voiding symptoms including frequency, urgency and dysuria Prior history of stone disease Noncontrast CT November 2022 showed minimal left hydronephrosis/hydroureter and no urinary tract calculi No tobacco history   PMH: Past Medical History:  Diagnosis Date   Back pain    Basal cell carcinoma 08/04/2020   0.5cm above the left mid brow, EDC 09/15/20   Cancer ([place])    Melanoma   Curvature of spine    Dysplastic nevus 04/30/2013   left post shoulder, left parasternal   Dysplastic nevus 05/04/2014   right prox ant deltoid, left lat buttock   Dysplastic nevus 03/26/2019   right lat neck adjacent to the inf top of MM in situ site scar   Dysplastic nevus 10/29/2019   upper back left paraspinal at level of mid scapula   Dysplastic nevus 03/31/2020, 05/12/20 shave removal   Left mid pretibial. Moderate to severe atypia, deep margin involved.   Dysplastic nevus 06/29/2021   L ant deltoid, moderat to severe atypia, excised 08/15/21   Endometriosis    Endometriosis    Fibrocystic disease of breast    Fibroid    GERD (gastroesophageal reflux disease)    HLD (hyperlipidemia)    Hyperlipemia    Hypertension    Kidney stone    Melanoma in situ ([place]) 07/03/2018   right lat neck inf to right angle of mandible, excised: 07/29/2018, margins free   Nephrolithiasis    Osteopenia    Osteoporosis    Pelvic pain in female    Raynaud disease    Thyromegaly     Surgical History: Past Surgical  History:  Procedure Laterality Date   ABDOMINAL HYSTERECTOMY     BILATERAL OOPHORECTOMY  1999   CESAREAN SECTION     COLONOSCOPY WITH PROPOFOL N/A 05/09/2015   Procedure: COLONOSCOPY WITH PROPOFOL;  Surgeon: Josefine Class, MD;  Location[facility]COPY;  Service: Endoscopy;  Laterality: N/A;   MELANOMA EXCISION Right 07/29/2018   lateral neck inf. to right angle of mandible   OOPHORECTOMY      Home Medications:  Allergies as of 09/14/2022       Reactions   Codeine Nausea And Vomiting   Neosporin Wound Cleanser [benzalkonium Chloride] Other (See Comments)   Skin blisters   Penicillins         Medication List        Accurate as of September 14, 2022 10:27 AM. If you have any questions, ask your nurse or doctor.          STOP taking these medications    gabapentin 100 MG capsule Commonly known as: NEURONTIN Stopped by: Abbie Sons, MD   hydrocortisone 2.5 % cream Stopped by: Abbie Sons, MD       TAKE these medications    aspirin 81 MG chewable tablet   calcium-vitamin D 500-200 MG-UNIT tablet Commonly known as: OSCAL WITH D Take by mouth.   Cholecalciferol 25 MCG (1000 UT) tablet Take by mouth. What changed: Another medication with the same name was removed. Continue taking  this medication, and follow the directions you see here. Changed by: Abbie Sons, MD   cyanocobalamin 1000 MCG tablet Take by mouth.   donepezil 10 MG tablet Commonly known as: ARICEPT TAKE 1 TABLET BY MOUTH ONCE EVERY EVENING   meloxicam 7.5 MG tablet Commonly known as: MOBIC Take 1 tablet by mouth daily.   memantine 5 MG tablet Commonly known as: NAMENDA Take 5 mg by mouth 2 (two) times daily.   omeprazole 40 MG capsule Commonly known as: PRILOSEC   raloxifene 60 MG tablet Commonly known as: EVISTA TAKE 1 TABLET BY MOUTH ONCE A DAY   rosuvastatin 5 MG tablet Commonly known as: CRESTOR Take 5 mg by mouth daily.        Allergies:  Allergies   Allergen Reactions   Codeine Nausea And Vomiting   Neosporin Wound Cleanser [Benzalkonium Chloride] Other (See Comments)    Skin blisters   Penicillins     Family History: Family History  Problem Relation Age of Onset   Emphysema Father    Cancer Neg Hx    Heart disease Neg Hx    Diabetes Neg Hx    Breast cancer Neg Hx     Social History:  reports that she has never smoked. She has never been exposed to tobacco smoke. She has never used smokeless tobacco. She reports that she does not drink alcohol and does not use drugs.   Physical Exam: BP (!) 174/79   Pulse 84   Ht '4\' 9"'$  (1.448 m)   Wt 111 lb (50.3 kg)   BMI 24.02 kg/m   Constitutional:  Alert and oriented, No acute distress. HEENT: [place] AT Respiratory: Normal respiratory effort, no increased work of breathing. Psychiatric: Normal mood and affect.  Laboratory Data:  Urinalysis Dipstick 3+ blood/2+ protein Microscopy >30 RBC   Assessment & Plan:    1. Gross hematuria AUA hematuria risk stratification: High I discussed the recommended evaluation of high risk hematuria which include CT urogram and cystoscopy. The procedure were discussed and all questions were answered.  She desires to proceed with further evaluation CTU order placed and cystoscopy was scheduled ***CTU order  Abbie Sons, MD  [facility] [address] [place], [place] [address] [phone]

## 2022-09-18 LAB — CULTURE, URINE COMPREHENSIVE

## 2022-09-21 ENCOUNTER — Other Ambulatory Visit: Payer: Self-pay | Admitting: Internal Medicine

## 2022-09-21 DIAGNOSIS — R413 Other amnesia: Secondary | ICD-10-CM | POA: Diagnosis not present

## 2022-09-21 DIAGNOSIS — I10 Essential (primary) hypertension: Secondary | ICD-10-CM | POA: Diagnosis not present

## 2022-09-21 DIAGNOSIS — E7800 Pure hypercholesterolemia, unspecified: Secondary | ICD-10-CM | POA: Diagnosis not present

## 2022-09-21 DIAGNOSIS — Z1231 Encounter for screening mammogram for malignant neoplasm of breast: Secondary | ICD-10-CM | POA: Diagnosis not present

## 2022-09-21 DIAGNOSIS — R7309 Other abnormal glucose: Secondary | ICD-10-CM | POA: Diagnosis not present

## 2022-09-21 DIAGNOSIS — Z79899 Other long term (current) drug therapy: Secondary | ICD-10-CM | POA: Diagnosis not present

## 2022-09-21 DIAGNOSIS — E538 Deficiency of other specified B group vitamins: Secondary | ICD-10-CM | POA: Diagnosis not present

## 2022-09-25 ENCOUNTER — Ambulatory Visit
Admission: RE | Admit: 2022-09-25 | Discharge: 2022-09-25 | Disposition: A | Discharge status: Home / Self Care | Payer: PPO | Source: Ambulatory Visit | Attending: Urology | Admitting: Urology

## 2022-09-25 DIAGNOSIS — R310 Gross hematuria: Secondary | ICD-10-CM | POA: Diagnosis not present

## 2022-09-25 DIAGNOSIS — K5730 Diverticulosis of large intestine without perforation or abscess without bleeding: Secondary | ICD-10-CM | POA: Diagnosis not present

## 2022-09-25 DIAGNOSIS — N2889 Other specified disorders of kidney and ureter: Secondary | ICD-10-CM | POA: Diagnosis not present

## 2022-09-25 LAB — POCT I-STAT CREATININE: Creatinine, Ser: 1.3 mg/dL — ABNORMAL HIGH (ref 0.44–1.00)

## 2022-09-25 MED ORDER — IOHEXOL 300 MG/ML  SOLN
100.0000 mL | Freq: Once | INTRAMUSCULAR | Status: AC | PRN
  Administered 2022-09-25: 100 mL via INTRAVENOUS

## 2022-09-27 ENCOUNTER — Telehealth: Payer: Self-pay

## 2022-09-27 NOTE — Telephone Encounter (Signed)
Patient has reviewed CT results and is concerned about the impression and would like to understand more.  I advised the daughter that the results had not been reviewed by the provider and that we would contact her with more details.

## 2022-10-02 ENCOUNTER — Other Ambulatory Visit: Payer: Self-pay | Admitting: Urology

## 2022-10-02 DIAGNOSIS — N2889 Other specified disorders of kidney and ureter: Secondary | ICD-10-CM

## 2022-10-02 NOTE — Telephone Encounter (Signed)
Spoke with patient's daughter (on [place]) and CT results were discussed in detail.  Recommend canceling cystoscopy 10/18/2021 and scheduling cystoscopy with left retrograde pyelogram; diagnostic ureteroscopy with possible biopsy.  All questions were answered and she desires to schedule.  Scheduling message sent

## 2022-10-02 NOTE — Progress Notes (Unsigned)
Surgical Physician Order Form [facility] [place]  * Scheduling expectation : Next Available  *Length of Case: 60 minutes  *Clearance needed: Per Gaspar Bidding  *Anticoagulation Instructions: N/A  *Aspirin Instructions: Hold Aspirin  *Post-op visit Date/Instructions:  1-2 week with pathology review  *Diagnosis:  Left renal mass  *Procedure: Left  retrograde pyelogram; left ureteroscopy with possible biopsy; left ureteral stent placement   Additional orders: N/A  -Admit type: OUTpatient  -Anesthesia: General  -VTE Prophylaxis Standing Order SCD's       Other:   -Standing Lab Orders Per Anesthesia    Lab other: UA&Urine Culture  -Standing Test orders EKG/Chest x-ray per Anesthesia       Test other:   - Medications:  Ancef 2gm IV  -Other orders:  Ok to proceed with Ancef PCN allergy reviewed; cancel cystoscopy scheduled 10/18/2022

## 2022-10-03 ENCOUNTER — Telehealth: Payer: Self-pay

## 2022-10-03 ENCOUNTER — Other Ambulatory Visit: Payer: PPO

## 2022-10-03 NOTE — Progress Notes (Signed)
   [facility] From  Surgery Date: Date: 10/09/2022  Surgeon: Dr. 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

## 2022-10-03 NOTE — Telephone Encounter (Signed)
I spoke with Jessica Morrison and her daughter [name]. We have discussed possible surgery dates and Tuesday December 26th, 2023 was agreed upon by all parties. Patient given information about surgery date, what to expect pre-operatively and post operatively.  We discussed that a Pre-Admission Testing office will be calling to set up the pre-op visit that will take place prior to surgery, and that these appointments are typically done over the phone with a Pre-Admissions RN.  Informed patient that our office will communicate any additional care to be provided after surgery. Patients questions or concerns were discussed during our call. Advised to call our office should there be any additional information, questions or concerns that arise. Patient verbalized understanding.

## 2022-10-05 ENCOUNTER — Encounter
Admission: RE | Admit: 2022-10-05 | Discharge: 2022-10-05 | Disposition: A | Discharge status: Home / Self Care | Payer: PPO | Source: Ambulatory Visit | Attending: Urology | Admitting: Urology

## 2022-10-05 ENCOUNTER — Encounter: Payer: Self-pay | Admitting: Urgent Care

## 2022-10-05 ENCOUNTER — Other Ambulatory Visit: Payer: Self-pay

## 2022-10-05 VITALS — Ht <= 58 in | Wt 111.0 lb

## 2022-10-05 DIAGNOSIS — Z01810 Encounter for preprocedural cardiovascular examination: Secondary | ICD-10-CM | POA: Insufficient documentation

## 2022-10-05 DIAGNOSIS — I10 Essential (primary) hypertension: Secondary | ICD-10-CM

## 2022-10-05 DIAGNOSIS — E785 Hyperlipidemia, unspecified: Secondary | ICD-10-CM

## 2022-10-05 DIAGNOSIS — R9431 Abnormal electrocardiogram [ECG] [EKG]: Secondary | ICD-10-CM | POA: Insufficient documentation

## 2022-10-05 HISTORY — DX: Personal history of urinary calculi: Z87.442

## 2022-10-05 NOTE — Patient Instructions (Signed)
Your procedure is scheduled on: 10/09/22 Report to [facility]. To find out your arrival time please call [phone] between 1PM - 3PM on 10/05/22 Today.  Remember: Instructions that are not followed completely may result in serious medical risk, up to and including death, or upon the discretion of your surgeon and anesthesiologist your surgery may need to be rescheduled.     _X__ 1. Do not eat food or drink any liquids after midnight the night before your procedure.                 No gum chewing or hard candies.   __X__2.  On the morning of surgery brush your teeth with toothpaste and water, you                 may rinse your mouth with mouthwash if you wish.  Do not swallow any              toothpaste of mouthwash.     _X__ 3.  No Alcohol for 24 hours before or after surgery.   _X__ 4.  Do Not Smoke or use e-cigarettes For 24 Hours Prior to Your Surgery.                 Do not use any chewable tobacco products for at least 6 hours prior to                 surgery.  ____  5.  Bring all medications with you on the day of surgery if instructed.   __X__  6.  Notify your doctor if there is any change in your medical condition      (cold, fever, infections).     Do not wear jewelry, make-up, hairpins, clips or nail polish. Do not wear lotions, powders, or perfumes.  Do not shave body hair 48 hours prior to surgery. Men may shave face and neck. Do not bring valuables to the hospital.    [facility] is not responsible for any belongings or valuables.  Contacts, dentures/partials or body piercings may not be worn into surgery. Bring a case for your contacts, glasses or hearing aids, a denture cup will be supplied. Leave your suitcase in the car. After surgery it may be brought to your room. For patients admitted to the hospital, discharge time is determined by your treatment team.   Patients discharged the day of surgery will not  be allowed to drive home.    __X__ Take these medicines the morning of surgery with A SIP OF WATER:    1. memantine (NAMENDA) 5 MG tablet   2. omeprazole (PRILOSEC) 40 MG capsule   3. raloxifene (EVISTA) 60 MG tablet   4. rosuvastatin (CRESTOR) 5 MG tablet   5.  6.  ____ Fleet Enema (as directed)   __X__ Use CHG Soap/SAGE wipes as directed  ____ Use inhalers on the day of surgery  ____ Stop metformin/Janumet/Farxiga 2 days prior to surgery    ____ Take 1/2 of usual insulin dose the night before surgery. No insulin the morning          of surgery.   ____ Stop Blood Thinners Coumadin/Plavix/Xarelto/Pleta/Pradaxa/Eliquis/Effient/Aspirin  on   Or contact your Surgeon, Cardiologist or Medical Doctor regarding  ability to stop your blood thinners  __X__ Stop Anti-inflammatories 7 days before surgery such as Advil, Ibuprofen, Motrin,  BC or Goodies Powder, Naprosyn, Naproxen, Aleve, Aspirin    __X__  Stop all herbals and supplements, fish oil or vitamins  until after surgery.    ____ Bring C-Pap to the hospital.

## 2022-10-09 ENCOUNTER — Ambulatory Visit
Admission: RE | Admit: 2022-10-09 | Discharge: 2022-10-09 | Disposition: A | Discharge status: Home / Self Care | Payer: PPO | Attending: Urology | Admitting: Urology

## 2022-10-09 ENCOUNTER — Ambulatory Visit: Payer: PPO | Admitting: Anesthesiology

## 2022-10-09 ENCOUNTER — Encounter: Payer: Self-pay | Admitting: Urology

## 2022-10-09 ENCOUNTER — Other Ambulatory Visit: Payer: Self-pay

## 2022-10-09 ENCOUNTER — Ambulatory Visit: Payer: PPO

## 2022-10-09 ENCOUNTER — Encounter
Admission: RE | Disposition: A | Discharge status: Home / Self Care | Payer: Self-pay | Source: Home / Self Care | Attending: Urology

## 2022-10-09 DIAGNOSIS — I7300 Raynaud's syndrome without gangrene: Secondary | ICD-10-CM | POA: Insufficient documentation

## 2022-10-09 DIAGNOSIS — I10 Essential (primary) hypertension: Secondary | ICD-10-CM | POA: Diagnosis not present

## 2022-10-09 DIAGNOSIS — K219 Gastro-esophageal reflux disease without esophagitis: Secondary | ICD-10-CM | POA: Diagnosis not present

## 2022-10-09 DIAGNOSIS — Z79899 Other long term (current) drug therapy: Secondary | ICD-10-CM | POA: Insufficient documentation

## 2022-10-09 DIAGNOSIS — Z85828 Personal history of other malignant neoplasm of skin: Secondary | ICD-10-CM | POA: Diagnosis not present

## 2022-10-09 DIAGNOSIS — Z85820 Personal history of malignant melanoma of skin: Secondary | ICD-10-CM | POA: Insufficient documentation

## 2022-10-09 DIAGNOSIS — M810 Age-related osteoporosis without current pathological fracture: Secondary | ICD-10-CM | POA: Insufficient documentation

## 2022-10-09 DIAGNOSIS — E785 Hyperlipidemia, unspecified: Secondary | ICD-10-CM | POA: Diagnosis not present

## 2022-10-09 DIAGNOSIS — M1990 Unspecified osteoarthritis, unspecified site: Secondary | ICD-10-CM | POA: Diagnosis not present

## 2022-10-09 DIAGNOSIS — C679 Malignant neoplasm of bladder, unspecified: Secondary | ICD-10-CM | POA: Insufficient documentation

## 2022-10-09 DIAGNOSIS — Z87442 Personal history of urinary calculi: Secondary | ICD-10-CM | POA: Diagnosis not present

## 2022-10-09 DIAGNOSIS — N2889 Other specified disorders of kidney and ureter: Secondary | ICD-10-CM

## 2022-10-09 DIAGNOSIS — C652 Malignant neoplasm of left renal pelvis: Secondary | ICD-10-CM | POA: Diagnosis not present

## 2022-10-09 DIAGNOSIS — M8580 Other specified disorders of bone density and structure, unspecified site: Secondary | ICD-10-CM | POA: Diagnosis not present

## 2022-10-09 DIAGNOSIS — R310 Gross hematuria: Secondary | ICD-10-CM | POA: Insufficient documentation

## 2022-10-09 HISTORY — PX: CYSTOSCOPY WITH RETROGRADE PYELOGRAM, URETEROSCOPY AND STENT PLACEMENT: SHX5789

## 2022-10-09 HISTORY — PX: URETERAL BIOPSY: SHX6688

## 2022-10-09 SURGERY — CYSTOURETEROSCOPY, WITH RETROGRADE PYELOGRAM AND STENT INSERTION
Anesthesia: General | Laterality: Left

## 2022-10-09 MED ORDER — PROPOFOL 10 MG/ML IV BOLUS
INTRAVENOUS | Status: AC
  Filled 2022-10-09: qty 20

## 2022-10-09 MED ORDER — FENTANYL CITRATE (PF) 100 MCG/2ML IJ SOLN
INTRAMUSCULAR | Status: AC
  Filled 2022-10-09: qty 2

## 2022-10-09 MED ORDER — ONDANSETRON HCL 4 MG/2ML IJ SOLN: 4.0000 mg | Freq: Once | INTRAMUSCULAR | Status: DC | PRN

## 2022-10-09 MED ORDER — LIDOCAINE HCL (PF) 2 % IJ SOLN
INTRAMUSCULAR | Status: AC
  Filled 2022-10-09: qty 5

## 2022-10-09 MED ORDER — CHLORHEXIDINE GLUCONATE 0.12 % MT SOLN: 15.0000 mL | Freq: Once | OROMUCOSAL | Status: AC

## 2022-10-09 MED ORDER — ACETAMINOPHEN 10 MG/ML IV SOLN
INTRAVENOUS | Status: DC | PRN
  Administered 2022-10-09: 750 mg via INTRAVENOUS

## 2022-10-09 MED ORDER — ORAL CARE MOUTH RINSE: 15.0000 mL | Freq: Once | OROMUCOSAL | Status: AC

## 2022-10-09 MED ORDER — DEXAMETHASONE SODIUM PHOSPHATE 10 MG/ML IJ SOLN
INTRAMUSCULAR | Status: DC | PRN
  Administered 2022-10-09: 8 mg via INTRAVENOUS

## 2022-10-09 MED ORDER — IOHEXOL 180 MG/ML  SOLN
INTRAMUSCULAR | Status: DC | PRN
  Administered 2022-10-09: 20 mL

## 2022-10-09 MED ORDER — TROSPIUM CHLORIDE 20 MG PO TABS: 20.0000 mg | ORAL_TABLET | Freq: Two times a day (BID) | ORAL | 0 refills | Status: DC | PRN

## 2022-10-09 MED ORDER — FENTANYL CITRATE (PF) 100 MCG/2ML IJ SOLN
INTRAMUSCULAR | Status: DC | PRN
  Administered 2022-10-09: 50 ug via INTRAVENOUS

## 2022-10-09 MED ORDER — DEXMEDETOMIDINE HCL IN NACL 80 MCG/20ML IV SOLN
INTRAVENOUS | Status: AC
  Filled 2022-10-09: qty 20

## 2022-10-09 MED ORDER — ACETAMINOPHEN 10 MG/ML IV SOLN
INTRAVENOUS | Status: AC
  Filled 2022-10-09: qty 100

## 2022-10-09 MED ORDER — FENTANYL CITRATE (PF) 100 MCG/2ML IJ SOLN: 25.0000 ug | INTRAMUSCULAR | Status: DC | PRN

## 2022-10-09 MED ORDER — LACTATED RINGERS IV SOLN: INTRAVENOUS | Status: DC

## 2022-10-09 MED ORDER — ONDANSETRON HCL 4 MG/2ML IJ SOLN
INTRAMUSCULAR | Status: AC
  Filled 2022-10-09: qty 2

## 2022-10-09 MED ORDER — DEXAMETHASONE SODIUM PHOSPHATE 10 MG/ML IJ SOLN
INTRAMUSCULAR | Status: AC
  Filled 2022-10-09: qty 1

## 2022-10-09 MED ORDER — TRAMADOL HCL 50 MG PO TABS: 50.0000 mg | ORAL_TABLET | Freq: Four times a day (QID) | ORAL | 0 refills | Status: DC | PRN

## 2022-10-09 MED ORDER — LIDOCAINE HCL (CARDIAC) PF 100 MG/5ML IV SOSY
PREFILLED_SYRINGE | INTRAVENOUS | Status: DC | PRN
  Administered 2022-10-09: 80 mg via INTRAVENOUS

## 2022-10-09 MED ORDER — PROPOFOL 10 MG/ML IV BOLUS
INTRAVENOUS | Status: DC | PRN
  Administered 2022-10-09: 90 mg via INTRAVENOUS

## 2022-10-09 MED ORDER — CEFAZOLIN SODIUM-DEXTROSE 2-4 GM/100ML-% IV SOLN
INTRAVENOUS | Status: AC
  Filled 2022-10-09: qty 100

## 2022-10-09 MED ORDER — CEFAZOLIN SODIUM-DEXTROSE 2-4 GM/100ML-% IV SOLN
2.0000 g | INTRAVENOUS | Status: AC
  Administered 2022-10-09: 2 g via INTRAVENOUS

## 2022-10-09 MED ORDER — EPHEDRINE SULFATE (PRESSORS) 50 MG/ML IJ SOLN
INTRAMUSCULAR | Status: DC | PRN
  Administered 2022-10-09 (×2): 5 mg via INTRAVENOUS

## 2022-10-09 MED ORDER — SODIUM CHLORIDE 0.9 % IR SOLN
Status: DC | PRN
  Administered 2022-10-09: 3000 mL via INTRAVESICAL

## 2022-10-09 MED ORDER — ONDANSETRON HCL 4 MG/2ML IJ SOLN
INTRAMUSCULAR | Status: DC | PRN
  Administered 2022-10-09: 4 mg via INTRAVENOUS

## 2022-10-09 MED ORDER — CHLORHEXIDINE GLUCONATE 0.12 % MT SOLN
OROMUCOSAL | Status: AC
  Administered 2022-10-09: 15 mL via OROMUCOSAL
  Filled 2022-10-09: qty 15

## 2022-10-09 MED ORDER — DEXMEDETOMIDINE HCL IN NACL 80 MCG/20ML IV SOLN
INTRAVENOUS | Status: DC | PRN
  Administered 2022-10-09: 4 ug via BUCCAL

## 2022-10-09 SURGICAL SUPPLY — 24 items
BAG DRAIN SIEMENS DORNER NS (MISCELLANEOUS) ×1 IMPLANT
BAG DRN NS LF (MISCELLANEOUS) ×1
BRUSH SCRUB EZ 1% IODOPHOR (MISCELLANEOUS) ×1 IMPLANT
CATH URET FLEX-TIP 2 LUMEN 10F (CATHETERS) IMPLANT
CATH URETL OPEN END 6X70 (CATHETERS) IMPLANT
DRSG TELFA 3X4 N-ADH STERILE (GAUZE/BANDAGES/DRESSINGS) ×1 IMPLANT
FORCEPS BIOP PIRANHA Y (CUTTING FORCEPS) IMPLANT
GAUZE 4X4 16PLY ~~LOC~~+RFID DBL (SPONGE) ×1 IMPLANT
GLOVE SURG UNDER POLY LF SZ7.5 (GLOVE) ×1 IMPLANT
GOWN STRL REUS W/ TWL LRG LVL3 (GOWN DISPOSABLE) ×1 IMPLANT
GOWN STRL REUS W/ TWL XL LVL3 (GOWN DISPOSABLE) ×1 IMPLANT
GOWN STRL REUS W/TWL LRG LVL3 (GOWN DISPOSABLE) ×1
GOWN STRL REUS W/TWL XL LVL3 (GOWN DISPOSABLE) ×1
GUIDEWIRE STR DUAL SENSOR (WIRE) ×1 IMPLANT
IV NS IRRIG 3000ML ARTHROMATIC (IV SOLUTION) ×1 IMPLANT
KIT TURNOVER CYSTO (KITS) ×1 IMPLANT
MANIFOLD NEPTUNE II (INSTRUMENTS) ×1 IMPLANT
NDL HYPO 18GX1.5 BLUNT FILL (NEEDLE) IMPLANT
NDL SAFETY ECLIP 18X1.5 (MISCELLANEOUS) ×1 IMPLANT
NEEDLE HYPO 18GX1.5 BLUNT FILL (NEEDLE) ×1 IMPLANT
PACK CYSTO AR (MISCELLANEOUS) ×1 IMPLANT
SET CYSTO W/LG BORE CLAMP LF (SET/KITS/TRAYS/PACK) ×1 IMPLANT
VALVE UROSEAL ADJ ENDO (VALVE) IMPLANT
WATER STERILE IRR 500ML POUR (IV SOLUTION) ×1 IMPLANT

## 2022-10-09 NOTE — Discharge Instructions (Addendum)
DISCHARGE INSTRUCTIONS FOR URETERAL STENT   MEDICATIONS:  1. Resume all your other meds from home.  2.  AZO (over-the-counter) can help with the burning/stinging when you urinate. 3.  Can take Tylenol or Mobic for pain, if something stronger needed Rx tramadol was sent to your pharmacy. 4.  Rx trospium was sent to pharmacy to help for frequency, urgency, bladder spasms secondary to stent  ACTIVITY:  1. May resume regular activities in 24 hours. 2. No driving while on narcotic pain medications  3. Drink plenty of water  4. Continue to walk at home - you can still get blood clots when you are at home, so keep active, but don't over do it.  5. May return to work/school tomorrow or when you feel ready   SIGNS/SYMPTOMS TO CALL:  Common postoperative symptoms include urinary frequency, urgency, bladder spasm and blood in the urine  Please call us if you have a fever greater than 101.5, uncontrolled nausea/vomiting, uncontrolled pain, dizziness, unable to urinate, excessively bloody urine, chest pain, shortness of breath, leg swelling, leg pain, or any other concerns or questions.   You can reach [place] at [phone].   FOLLOW-UP:  1. You have a follow-up appointment scheduled 10/18/2022  AMBULATORY SURGERY  DISCHARGE INSTRUCTIONS   The drugs that you were given will stay in your system until tomorrow so for the next 24 hours you should not:  Drive an automobile Make any legal decisions Drink any alcoholic beverage   You may resume regular meals tomorrow.  Today it is better to start with liquids and gradually work up to solid foods.  You may eat anything you prefer, but it is better to start with liquids, then soup and crackers, and gradually work up to solid foods.   Please notify your doctor immediately if you have any unusual bleeding, trouble breathing, redness and pain at the surgery site, drainage, fever, or pain not relieved by medication.    Additional  Instructions:   Please contact your physician with any problems or Same Day Surgery at [phone], Monday through Friday 6 am to 4 pm, or [place] at [facility] number at [phone].

## 2022-10-09 NOTE — Transfer of Care (Signed)
Immediate Anesthesia Transfer of Care Note  Patient: Jessica Morrison  Procedure(s) Performed: CYSTOSCOPY WITH RETROGRADE PYELOGRAM, URETEROSCOPY AND STENT PLACEMENT (Left) URETERAL BIOPSY (Left)  Patient Location: PACU  Anesthesia Type:General  Level of Consciousness: sedated  Airway & Oxygen Therapy: Patient Spontanous Breathing and Patient connected to face mask oxygen  Post-op Assessment: Report given to RN and Post -op Vital signs reviewed and stable  Post vital signs: Reviewed and stable  Last Vitals:  Vitals Value Taken Time  BP 118/64 10/09/22 0848  Temp    Pulse 72 10/09/22 0848  Resp 11 10/09/22 0848  SpO2 99 % 10/09/22 0848    Last Pain:  Vitals:   10/09/22 0623  TempSrc: Temporal  PainSc: 0-No pain         Complications: No notable events documented.

## 2022-10-09 NOTE — Anesthesia Procedure Notes (Addendum)
Date/Time: 10/09/2022 7:34 AM  Performed by: Johnna Acosta, CRNAPre-anesthesia Checklist: Patient identified, Emergency Drugs available, Suction available, Patient being monitored and Timeout performed Patient Re-evaluated:Patient Re-evaluated prior to induction Oxygen Delivery Method: Circle system utilized Preoxygenation: Pre-oxygenation with 100% oxygen Induction Type: IV induction LMA: LMA inserted LMA Size: 3.0 Tube type: Oral Number of attempts: 1 Placement Confirmation: positive ETCO2 and breath sounds checked- equal and bilateral Tube secured with: Tape Dental Injury: Teeth and Oropharynx as per pre-operative assessment

## 2022-10-09 NOTE — H&P (Signed)
Urology H&P   Chief Complaint: Hematuria  History of Present Illness: Jessica Morrison is a 76 y.o. initially seen 09/14/2022 with a 3-year history of intermittent gross hematuria.  She was scheduled for CT urogram with follow-up office cystoscopy.  CTU performed 09/25/2022 was remarkable for a complex cystic lesion in the upper pole of the left kidney measuring 3.8 x 2.5 cm.  It was unclear if the mass involve the collecting system.  She presents today for cystoscopy under anesthesia with left retrograde pyelogram and possible left ureteroscopy.  Past Medical History:  Diagnosis Date   Back pain    Basal cell carcinoma 08/04/2020   0.5cm above the left mid brow, EDC 09/15/20   Cancer ([place])    Melanoma   Curvature of spine    Dysplastic nevus 04/30/2013   left post shoulder, left parasternal   Dysplastic nevus 05/04/2014   right prox ant deltoid, left lat buttock   Dysplastic nevus 03/26/2019   right lat neck adjacent to the inf top of MM in situ site scar   Dysplastic nevus 10/29/2019   upper back left paraspinal at level of mid scapula   Dysplastic nevus 03/31/2020, 05/12/20 shave removal   Left mid pretibial. Moderate to severe atypia, deep margin involved.   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 to right angle of mandible   OOPHORECTOMY      Home Medications:  Current Meds  Medication Sig   acetaminophen (TYLENOL) 325 MG tablet Take 650 mg by mouth every 6 (six) hours as needed.   calcium-vitamin D (OSCAL WITH D) 500-200 MG-UNIT tablet 1 tablet daily with breakfast.   Cholecalciferol 25 MCG (1000 UT) tablet Take 1,000 Units by mouth daily.   cyanocobalamin 1000 MCG tablet Take 1,000 mcg by mouth daily.   donepezil (ARICEPT) 10 MG tablet TAKE 1 TABLET BY MOUTH ONCE EVERY EVENING   meloxicam (MOBIC) 7.5 MG tablet Take 1 tablet by mouth daily.   memantine (NAMENDA) 5 MG tablet Take 5 mg by mouth 2 (two) times daily.   omeprazole (PRILOSEC) 40 MG capsule 40 mg daily.   raloxifene (EVISTA) 60 MG tablet TAKE 1 TABLET BY MOUTH ONCE A DAY (Patient taking differently: Take 60 mg by mouth daily.)   rosuvastatin (CRESTOR) 5 MG tablet Take 5 mg by mouth daily.    Allergies:  Allergies  Allergen Reactions   Codeine Nausea And Vomiting   Neosporin Wound Cleanser [Benzalkonium Chloride] Other (See Comments)  Skin blisters   Penicillins     Family History  Problem Relation Age of Onset   Emphysema Father    Cancer Neg Hx    Heart disease Neg Hx    Diabetes Neg Hx    Breast cancer Neg Hx     Social History:  reports that she has never smoked. She has never been exposed to tobacco smoke. She has never used smokeless tobacco. She reports that she does not drink alcohol and does not use drugs.  ROS: Noncontributory except as per the HPI  Physical Exam:  Vital signs in last 24 hours: Temp:  [96.6 F (35.9 C)] 96.6 F (35.9 C) (12/26 0623) Pulse Rate:  [91] 91 (12/26 0623) Resp:  [16] 16 (12/26 0623) BP: (162)/(88) 162/88 (12/26 0623) SpO2:  [100 %] 100 % (12/26 0623) Weight:  [50.3  kg] 50.3 kg (12/26 0938) Constitutional:  Alert and oriented, No acute distress HEENT: [place] AT, moist mucus membranes.  Trachea midline, no masses Cardiovascular: Regular rate and rhythm. Respiratory: Normal respiratory effort, lungs clear bilaterally GI: Abdomen is soft, nontender, nondistended, no abdominal masses GU: No CVA tenderness Skin: No rashes, bruises or suspicious lesions Lymph: No cervical or inguinal adenopathy Neurologic: Grossly intact, no focal deficits, moving all 4 extremities Psychiatric: Normal mood and affect   Impression/Assessment:   1.  Left renal mass Unable to ascertain if the mass involves the collecting system  Plan:  Cystoscopy under anesthesia with left retrograde pyelogram and possible left ureteroscopy. The procedure was discussed in detail including potential risks of bleeding, infection ureteral/renal injury.  The potential need for a postoperative ureteral stent was discussed with the possibility of bothersome stent symptoms.  All questions were answered and she desires to proceed.   10/09/2022, 7:17 AM  John Giovanni,  MD

## 2022-10-09 NOTE — Interval H&P Note (Signed)
History and Physical Interval Note:  10/09/2022 7:24 AM  Jessica Morrison  has presented today for surgery, with the diagnosis of Left Renal Mass.  The various methods of treatment have been discussed with the patient and family. After consideration of risks, benefits and other options for treatment, the patient has consented to  Procedure(s): CYSTOSCOPY WITH RETROGRADE PYELOGRAM, URETEROSCOPY AND STENT PLACEMENT (Left) URETERAL BIOPSY (Left) as a surgical intervention.  The patient's history has been reviewed, patient examined, no change in status, stable for surgery.  I have reviewed the patient's chart and labs.  Questions were answered to the patient's satisfaction.     [place]

## 2022-10-09 NOTE — Anesthesia Preprocedure Evaluation (Signed)
Anesthesia Evaluation  Patient identified by MRN, date of birth, ID band Patient awake    Reviewed: Allergy & Precautions, NPO status , Patient's Chart, lab work & pertinent test results  Airway Mallampati: II  TM Distance: >3 FB Neck ROM: full    Dental  (+) Teeth Intact   Pulmonary neg pulmonary ROS   Pulmonary exam normal breath sounds clear to auscultation       Cardiovascular Exercise Tolerance: Good hypertension, Pt. on medications negative cardio ROS Normal cardiovascular exam Rhythm:Regular Rate:Normal     Neuro/Psych negative neurological ROS  negative psych ROS   GI/Hepatic negative GI ROS, Neg liver ROS,GERD  Medicated,,  Endo/Other  negative endocrine ROS    Renal/GU Renal disease  negative genitourinary   Musculoskeletal  (+) Arthritis ,    Abdominal Normal abdominal exam  (+)   Peds negative pediatric ROS (+)  Hematology negative hematology ROS (+)   Anesthesia Other Findings Past Medical History: No date: Back pain 08/04/2020: Basal cell carcinoma     Comment:  0.5cm above the left mid brow, [facility] 09/15/20 No date: Cancer [facility])     Comment:  Melanoma No date: Curvature of spine 04/30/2013: Dysplastic nevus     Comment:  left post shoulder, left parasternal 05/04/2014: Dysplastic nevus     Comment:  right prox ant deltoid, left lat buttock 03/26/2019: Dysplastic nevus     Comment:  right lat neck adjacent to the inf top of MM in situ               site scar 10/29/2019: Dysplastic nevus     Comment:  upper back left paraspinal at level of mid scapula 03/31/2020, 05/12/20 shave removal: Dysplastic nevus     Comment:  Left mid pretibial. Moderate to severe atypia, deep               margin involved. 06/29/2021: Dysplastic nevus     Comment:  L ant deltoid, moderat to severe atypia, excised               08/15/21 No date: Endometriosis No date: Endometriosis No date: Fibrocystic disease of  breast No date: Fibroid No date: GERD (gastroesophageal reflux disease) No date: History of kidney stones No date: HLD (hyperlipidemia) No date: Hyperlipemia No date: Hypertension No date: Kidney stone 07/03/2018: Melanoma in situ [facility])     Comment:  right lat neck inf to right angle of mandible, excised:               07/29/2018, margins free No date: Nephrolithiasis No date: Osteopenia No date: Osteoporosis No date: Pelvic pain in female No date: Raynaud disease No date: Thyromegaly  Past Surgical History: No date: ABDOMINAL HYSTERECTOMY 1999: BILATERAL OOPHORECTOMY No date: CESAREAN SECTION 05/09/2015: COLONOSCOPY WITH PROPOFOL; N/A     Comment:  Procedure: COLONOSCOPY WITH PROPOFOL;  Surgeon: Josefine Class, MD;  Location: [facility] ENDOSCOPY;  Service:               Endoscopy;  Laterality: N/A; 07/29/2018: MELANOMA EXCISION; Right     Comment:  lateral neck inf. to right angle of mandible No date: OOPHORECTOMY  BMI    Body Mass Index: 25.77 kg/m      Reproductive/Obstetrics negative OB ROS  Anesthesia Physical Anesthesia Plan  ASA: 2  Anesthesia Plan: General   Post-op Pain Management:    Induction: Intravenous  PONV Risk Score and Plan: Dexamethasone, Ondansetron, Midazolam and Treatment may vary due to age or medical condition  Airway Management Planned: LMA  Additional Equipment:   Intra-op Plan:   Post-operative Plan:   Informed Consent: I have reviewed the patients History and Physical, chart, labs and discussed the procedure including the risks, benefits and alternatives for the proposed anesthesia with the patient or authorized representative who has indicated his/her understanding and acceptance.     Dental Advisory Given  Plan Discussed with: CRNA and Surgeon  Anesthesia Plan Comments:        Anesthesia Quick Evaluation

## 2022-10-09 NOTE — Anesthesia Postprocedure Evaluation (Signed)
Anesthesia Post Note  Patient: Jessica Morrison  Procedure(s) Performed: CYSTOSCOPY WITH RETROGRADE PYELOGRAM, URETEROSCOPY AND STENT PLACEMENT (Left) URETERAL BIOPSY (Left)  Patient location during evaluation: PACU Anesthesia Type: General Level of consciousness: awake Pain management: pain level controlled Vital Signs Assessment: post-procedure vital signs reviewed and stable Respiratory status: spontaneous breathing and nonlabored ventilation Cardiovascular status: stable Anesthetic complications: no  No notable events documented.   Last Vitals:  Vitals:   10/09/22 0846 10/09/22 0848  BP: 118/64 118/64  Pulse:  72  Resp:  11  Temp: (!) 36.2 C   SpO2:  99%    Last Pain:  Vitals:   10/09/22 0846  TempSrc:   PainSc: Asleep                 VAN STAVEREN,Omer Monter

## 2022-10-09 NOTE — Op Note (Signed)
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 Britanny Marksberry, M.D.  Anesthesia: General  Complications: None  Intraoperative findings:  Cystoscopy-normal appearing mucosa without erythema, solid or papillary lesions.  UOs normal-appearing bilaterally with clear efflux Left retrograde pyelography-normal-appearing ureter, renal pelvis and mid/lower infundibula, calyces.  Marked dilation of upper pole calyx with filling defect Left ureteropyeloscopy-normal-appearing ureter without lesion/tumor.  Papillary tumor present superior infundibulum/calyx   EBL: Minimal  Specimens: Saline washing upper pole infundibulum for cytology Biopsies papillary tumor upper pole infundibulum   Indication: LORIELLE BOEHNING is a 76 y.o. female with a 3-year history of intermittent gross hematuria.  She was scheduled for CT urogram with follow-up office cystoscopy. CTU performed 09/25/2022 was remarkable for a complex cystic lesion in the upper pole of the left kidney measuring 3.8 x 2.5 cm. It was unclear if the mass involved the collecting system versus renal parenchyma. After reviewing the management options for treatment, the patient elected to proceed with the above surgical procedure(s). We have discussed the potential benefits and risks of the procedure, side effects of the proposed treatment, the likelihood of the patient achieving the goals of the procedure, and any potential problems that might occur during the procedure or recuperation. Informed consent has been obtained.  Description of procedure:  The patient was taken to the operating room and general anesthesia was  induced.  The patient was placed in the dorsal lithotomy position, prepped and draped in the usual sterile fashion, and preoperative antibiotics were administered. A preoperative time-out was performed.   A 21 French cystoscope was lubricated and passed per urethra.  Panendoscopy was performed with findings as described above  Attention directed to the left ureteral orifice and a ureteral catheter was used to intubate the ureteral orifice.  Omnipaque contrast was injected through the ureteral catheter and a retrograde pyelogram was performed with findings as dictated above.  A 0.038 Sensor wire was then placed through the ureteral catheter and the catheter was advanced to the proximal ureter and repeat retrograde was performed as described above.  The guidewire was replaced and the ureteral catheter was removed.  A dual-lumen ureteral catheter was placed over the guidewire and a second Sensor wire was placed.  The dual-lumen catheter was removed and an 8 [place] single channel digital flexible ureteroscope was placed over the working wire and easily advanced into the ureter.  The ureter was normal in appearance as described above.  Pyeloscopy was then performed with findings as described above.  The mid and lower infundibula/calyces were normal in appearance.  Saline barbotage was then performed of the upper pole infundibulum as described above.  Biopsies x 3 of the papillary tumor was performed with Piranha forceps with good tissue obtained.  Hemostasis was noted to be adequate and the ureteroscope was removed.  A 74F/22 cm contour ureteral stent was then placed under fluoroscopic guidance.  The proximal tip of the stent was positioned in a lower pole infundibulum (small renal pelvis).  The distal stent and was well-positioned in the bladder.  The bladder was then emptied and no significant hematuria noted.  After anesthetic reversal she was transported to the PACU in stable  condition.     Plan: 1 week follow-up for pathology review and  cystoscopy with stent removal   John Giovanni, MD

## 2022-10-11 LAB — SURGICAL PATHOLOGY

## 2022-10-11 LAB — CYTOLOGY - NON PAP

## 2022-10-12 ENCOUNTER — Encounter: Payer: Self-pay | Admitting: Urology

## 2022-10-18 ENCOUNTER — Ambulatory Visit (INDEPENDENT_AMBULATORY_CARE_PROVIDER_SITE_OTHER): Payer: PPO | Admitting: Urology

## 2022-10-18 ENCOUNTER — Telehealth: Payer: Self-pay

## 2022-10-18 ENCOUNTER — Encounter: Payer: Self-pay | Admitting: Urology

## 2022-10-18 ENCOUNTER — Ambulatory Visit: Payer: PPO | Admitting: Urology

## 2022-10-18 VITALS — BP 146/84 | HR 101 | Ht <= 58 in | Wt 101.0 lb

## 2022-10-18 DIAGNOSIS — Z466 Encounter for fitting and adjustment of urinary device: Secondary | ICD-10-CM

## 2022-10-18 DIAGNOSIS — N2889 Other specified disorders of kidney and ureter: Secondary | ICD-10-CM

## 2022-10-18 DIAGNOSIS — C642 Malignant neoplasm of left kidney, except renal pelvis: Secondary | ICD-10-CM

## 2022-10-18 DIAGNOSIS — R310 Gross hematuria: Secondary | ICD-10-CM | POA: Diagnosis not present

## 2022-10-18 LAB — URINALYSIS, COMPLETE
Bilirubin, UA: NEGATIVE
Glucose, UA: NEGATIVE
Nitrite, UA: NEGATIVE
Specific Gravity, UA: 1.03 (ref 1.005–1.030)
Urobilinogen, Ur: 0.2 mg/dL (ref 0.2–1.0)
pH, UA: 5.5 (ref 5.0–7.5)

## 2022-10-18 LAB — MICROSCOPIC EXAMINATION: RBC, Urine: >30 /hpf — AB (ref 0–2)

## 2022-10-18 MED ORDER — CIPROFLOXACIN HCL 500 MG PO TABS
500.0000 mg | ORAL_TABLET | Freq: Once | ORAL | Status: AC
  Administered 2022-10-18: 500 mg via ORAL

## 2022-10-18 NOTE — Telephone Encounter (Signed)
[name], patient's daughter, left a voicemail on triage line stating patient had her stent removed earlier today and is complaining of bad cramps and pain and wonders if patient can get any medication for this.   Before we could call [name] back she called again and said that patient's pain and cramping resolved now. Advised to push fluids and update [place] if symptoms get worse or re occur.

## 2022-10-19 ENCOUNTER — Encounter: Payer: Self-pay | Admitting: Urology

## 2022-10-21 NOTE — Progress Notes (Signed)
Indications: Patient is 77 y.o., who is s/p left ureteroscopy 10/09/2022 for evaluation of a possible filling defect in a left upper pole calyx with findings of papillary tumor consistent with urothelial carcinoma.  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  The patient is presenting today for stent removal.  She presents today with her daughter.  She has had moderate stent irritative symptoms.  Procedure:  Flexible Cystoscopy with stent removal (29528)  Timeout was performed and the correct patient, procedure and participants were identified.    Description:  The patient was prepped and draped in the usual sterile fashion. Flexible cystosopy was performed.  The stent was visualized, grasped, and removed intact without difficulty. The patient tolerated the procedure well.  A single dose of oral antibiotics was given.  Complications:  None  Plan:   The pathology report was discussed in detail.  There is no evidence of metastatic disease.  Her creatinine was 1.2.  We discussed the recommended treatment of high-grade urothelial carcinoma his nephroureterectomy which was reviewed. Discussed with Dr. Apolinar Junes who recommended oncology referral to discuss neoadjuvant chemotherapy.  A follow-up appointment will be made with Dr. Hewitt Shorts, MD

## 2022-10-22 ENCOUNTER — Encounter: Payer: Self-pay | Admitting: Urology

## 2022-10-22 ENCOUNTER — Telehealth: Payer: Self-pay | Admitting: *Deleted

## 2022-10-22 NOTE — Telephone Encounter (Signed)
-----   Message from Abbie Sons, MD sent at 10/22/2022  3:43 PM EST ----- Regarding: Appointment Please schedule appointment with Dr. Erlene Quan in approximately 1 month to discuss nephroureterectomy

## 2022-10-22 NOTE — Telephone Encounter (Signed)
Appt maded with Dr. Erlene Quan

## 2022-10-25 ENCOUNTER — Encounter: Payer: Self-pay | Admitting: Oncology

## 2022-10-25 ENCOUNTER — Inpatient Hospital Stay: Payer: PPO | Attending: Oncology | Admitting: Oncology

## 2022-10-25 ENCOUNTER — Inpatient Hospital Stay: Payer: PPO

## 2022-10-25 VITALS — BP 161/84 | HR 80 | Temp 98.6°F | Resp 16 | Ht <= 58 in | Wt 110.0 lb

## 2022-10-25 DIAGNOSIS — C689 Malignant neoplasm of urinary organ, unspecified: Secondary | ICD-10-CM | POA: Insufficient documentation

## 2022-10-25 DIAGNOSIS — R319 Hematuria, unspecified: Secondary | ICD-10-CM | POA: Diagnosis not present

## 2022-10-25 DIAGNOSIS — C642 Malignant neoplasm of left kidney, except renal pelvis: Secondary | ICD-10-CM | POA: Diagnosis not present

## 2022-10-26 NOTE — Progress Notes (Signed)
[place]  [phone] [phone]  ID: Jessica Morrison OB: 1946-05-05  MR#: [id number]  NAT#:[id number]  Patient Care Team: Idelle Crouch, MD as PCP - General (Internal Medicine)  CHIEF COMPLAINT: Urothelial carcinoma of the kidney.  INTERVAL HISTORY: Patient is a 77 year old female who had chronic hematuria for several years.  Upon closer evaluation, patient was noted to have a 3.8 cm complex cystic lesion with pathology consistent of urothelial carcinoma.  She currently feels well.  She has no neurologic complaints.  She denies any recent fevers or illnesses.  She has a good appetite and denies weight loss.  She has no chest pain, shortness of breath, cough, or hemoptysis.  She denies any nausea, vomiting, constipation, or diarrhea.  She has no other urinary complaints.  Patient offers no further specific complaints today.  REVIEW OF SYSTEMS:   Review of Systems  Constitutional: Negative.  Negative for fever, malaise/fatigue and weight loss.  Respiratory: Negative.  Negative for cough and shortness of breath.   Cardiovascular: Negative.  Negative for chest pain and leg swelling.  Gastrointestinal: Negative.  Negative for abdominal pain.  Genitourinary:  Positive for hematuria.  Musculoskeletal: Negative.  Negative for back pain.  Skin: Negative.  Negative for rash.  Neurological: Negative.  Negative for dizziness, focal weakness, weakness and headaches.  Psychiatric/Behavioral: Negative.  The patient is not nervous/anxious.     As per HPI. Otherwise, a complete review of systems is negative.  PAST MEDICAL HISTORY: Past Medical History:  Diagnosis Date   Back pain    Basal cell carcinoma 08/04/2020   0.5cm above the left mid brow, EDC 09/15/20   Cancer ([place])    Melanoma   Curvature of spine    Dysplastic nevus 04/30/2013   left post shoulder, left parasternal   Dysplastic nevus 05/04/2014   right prox ant deltoid, left lat buttock    Dysplastic nevus 03/26/2019   right lat neck adjacent to the inf top of MM in situ site scar   Dysplastic nevus 10/29/2019   upper back left paraspinal at level of mid scapula   Dysplastic nevus 03/31/2020, 05/12/20 shave removal   Left mid pretibial. Moderate to severe atypia, deep margin involved.   Dysplastic nevus 06/29/2021   L ant deltoid, moderat to severe atypia, excised 08/15/21   Endometriosis    Endometriosis    Fibrocystic disease of breast    Fibroid    GERD (gastroesophageal reflux disease)    History of kidney stones    HLD (hyperlipidemia)    Hyperlipemia    Hypertension    Kidney stone    Melanoma in situ ([place]) 07/03/2018   right lat neck inf to right angle of mandible, excised: 07/29/2018, margins free   Nephrolithiasis    Osteopenia    Osteoporosis    Pelvic pain in female    Raynaud disease    Thyromegaly     PAST SURGICAL HISTORY: Past Surgical History:  Procedure Laterality Date   ABDOMINAL HYSTERECTOMY     BILATERAL OOPHORECTOMY  1999   CESAREAN SECTION     COLONOSCOPY WITH PROPOFOL N/A 05/09/2015   Procedure: COLONOSCOPY WITH PROPOFOL;  Surgeon: Josefine Class, MD;  Location: [facility] ENDOSCOPY;  Service: Endoscopy;  Laterality: N/A;   CYSTOSCOPY WITH RETROGRADE PYELOGRAM, URETEROSCOPY AND STENT PLACEMENT Left 10/09/2022   Procedure: CYSTOSCOPY WITH RETROGRADE PYELOGRAM, URETEROSCOPY AND STENT PLACEMENT;  Surgeon: Abbie Sons, MD;  Location: [facility] ORS;  Service: Urology;  Laterality: Left;   MELANOMA  EXCISION Right 07/29/2018   lateral neck inf. to right angle of mandible   OOPHORECTOMY     URETERAL BIOPSY Left 10/09/2022   Procedure: URETERAL BIOPSY;  Surgeon: Abbie Sons, MD;  Location: [facility] ORS;  Service: Urology;  Laterality: Left;    FAMILY HISTORY: Family History  Problem Relation Age of Onset   Emphysema Father    Cancer Neg Hx    Heart disease Neg Hx    Diabetes Neg Hx    Breast cancer Neg Hx     ADVANCED DIRECTIVES  (Y/N):  N  HEALTH MAINTENANCE: Social History   Tobacco Use   Smoking status: Never    Passive exposure: Never   Smokeless tobacco: Never  Vaping Use   Vaping Use: Never used  Substance Use Topics   Alcohol use: No   Drug use: No     Colonoscopy:  PAP:  Bone density:  Lipid panel:  Allergies  Allergen Reactions   Codeine Nausea And Vomiting   Neosporin Wound Cleanser [Benzalkonium Chloride] Other (See Comments)    Skin blisters   Penicillins     Current Outpatient Medications  Medication Sig Dispense Refill   acetaminophen (TYLENOL) 325 MG tablet Take 650 mg by mouth every 6 (six) hours as needed.     calcium-vitamin D (OSCAL WITH D) 500-200 MG-UNIT tablet 1 tablet daily with breakfast.     Cholecalciferol 25 MCG (1000 UT) tablet Take 1,000 Units by mouth daily.     cyanocobalamin 1000 MCG tablet Take 1,000 mcg by mouth daily.     donepezil (ARICEPT) 10 MG tablet TAKE 1 TABLET BY MOUTH ONCE EVERY EVENING     meloxicam (MOBIC) 7.5 MG tablet Take 1 tablet by mouth daily.     memantine (NAMENDA) 5 MG tablet Take 5 mg by mouth 2 (two) times daily.     omeprazole (PRILOSEC) 40 MG capsule 40 mg daily.     raloxifene (EVISTA) 60 MG tablet TAKE 1 TABLET BY MOUTH ONCE A DAY (Patient taking differently: Take 60 mg by mouth daily.) 90 tablet 1   rosuvastatin (CRESTOR) 5 MG tablet Take 5 mg by mouth daily.     tamsulosin (FLOMAX) 0.4 MG CAPS capsule Take by mouth. (Patient not taking: Reported on 10/25/2022)     traMADol (ULTRAM) 50 MG tablet Take 1 tablet (50 mg total) by mouth every 6 (six) hours as needed for moderate pain. (Patient not taking: Reported on 10/25/2022) 8 tablet 0   trospium (SANCTURA) 20 MG tablet Take 1 tablet (20 mg total) by mouth 2 (two) times daily as needed (frequency,urgency,bladder spasm). (Patient not taking: Reported on 10/25/2022) 20 tablet 0   No current facility-administered medications for this visit.    OBJECTIVE: Vitals:   10/25/22 1127  BP: (!)  161/84  Pulse: 80  Resp: 16  Temp: 98.6 F (37 C)  SpO2: 100%     Body mass index is 25.57 kg/m.    ECOG FS:0 - Asymptomatic  General: Well-developed, well-nourished, no acute distress. Eyes: Pink conjunctiva, anicteric sclera. HEENT: Normocephalic, moist mucous membranes. Lungs: No audible wheezing or coughing. Heart: Regular rate and rhythm. Abdomen: Soft, nontender, no obvious distention. Musculoskeletal: No edema, cyanosis, or clubbing. Neuro: Alert, answering all questions appropriately. Cranial nerves grossly intact. Skin: No rashes or petechiae noted. Psych: Normal affect. Lymphatics: No cervical, calvicular, axillary or inguinal LAD.   LAB RESULTS:  Lab Results  Component Value Date   CREATININE 1.30 (H) 09/25/2022    No results found  for: "WBC", "NEUTROABS", "HGB", "HCT", "MCV", "PLT"   STUDIES: DG OR UROLOGY CYSTO IMAGE ([facility] ONLY)  Result Date: 10/09/2022 There is no interpretation for this exam.  This order is for images obtained during a surgical procedure.  Please See "Surgeries" Tab for more information regarding the procedure.    ASSESSMENT: Urothelial carcinoma of the kidney.  PLAN:    Urothelial carcinoma of the kidney: Patient will require surgical resection and has an appointment with Dr. Erlene Quan in the next several weeks.  Unclear if neoadjuvant or adjuvant chemotherapy is necessary and will discuss the case with urology.  Will get a PET scan in the next 1 to 2 weeks to complete the staging workup.  Further follow-up will be based on discussion with urology and surgical resection. 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  I spent a total of 60 minutes reviewing chart data, face-to-face evaluation with the patient, counseling and coordination of care as detailed above.   Patient expressed understanding and was in agreement with this plan. She also understands that She can call clinic at any time with any questions, concerns, or  complaints.    Cancer Staging  No matching staging information was found for the patient.  Lloyd Huger, MD   10/26/2022 6:56 AM

## 2022-10-31 ENCOUNTER — Inpatient Hospital Stay (HOSPITAL_BASED_OUTPATIENT_CLINIC_OR_DEPARTMENT_OTHER): Payer: PPO | Admitting: Hospice and Palliative Medicine

## 2022-10-31 DIAGNOSIS — C689 Malignant neoplasm of urinary organ, unspecified: Secondary | ICD-10-CM

## 2022-10-31 NOTE — Progress Notes (Signed)
Multidisciplinary Oncology Council Documentation  Jessica Morrison was presented by our [facility] on 10/31/2022, which included representatives from:  Palliative Care Dietitian  Physical/Occupational Therapist Nurse Navigator Genetics Speech Therapist Social work Survivorship RN Financial Navigator Research RN   [name] currently presents with history of urothelial cancer  We reviewed previous medical and familial history, history of present illness, and recent lab results along with all available histopathologic and imaging studies. The [facility] considered available treatment options and made the following recommendations/referrals:  None currently  The MOC is a meeting of clinicians from various specialty areas who evaluate and discuss patients for whom a multidisciplinary approach is being considered. Final determinations in the plan of care are those of the provider(s).   Today's extended care, comprehensive team conference, Ariza was not present for the discussion and was not examined.

## 2022-11-01 ENCOUNTER — Encounter: Payer: Self-pay | Admitting: Urology

## 2022-11-02 ENCOUNTER — Telehealth: Payer: Self-pay | Admitting: Family Medicine

## 2022-11-02 NOTE — Telephone Encounter (Signed)
Error

## 2022-11-13 ENCOUNTER — Ambulatory Visit
Admission: RE | Admit: 2022-11-13 | Discharge: 2022-11-13 | Disposition: A | Discharge status: Home / Self Care | Payer: PPO | Source: Ambulatory Visit | Attending: Oncology | Admitting: Oncology

## 2022-11-13 DIAGNOSIS — I2510 Atherosclerotic heart disease of native coronary artery without angina pectoris: Secondary | ICD-10-CM | POA: Diagnosis not present

## 2022-11-13 DIAGNOSIS — I700 Atherosclerosis of aorta: Secondary | ICD-10-CM | POA: Diagnosis not present

## 2022-11-13 DIAGNOSIS — M47816 Spondylosis without myelopathy or radiculopathy, lumbar region: Secondary | ICD-10-CM | POA: Insufficient documentation

## 2022-11-13 DIAGNOSIS — M419 Scoliosis, unspecified: Secondary | ICD-10-CM | POA: Diagnosis not present

## 2022-11-13 DIAGNOSIS — I517 Cardiomegaly: Secondary | ICD-10-CM | POA: Diagnosis not present

## 2022-11-13 DIAGNOSIS — C642 Malignant neoplasm of left kidney, except renal pelvis: Secondary | ICD-10-CM | POA: Diagnosis not present

## 2022-11-13 DIAGNOSIS — C689 Malignant neoplasm of urinary organ, unspecified: Secondary | ICD-10-CM | POA: Diagnosis not present

## 2022-11-13 LAB — GLUCOSE, CAPILLARY: Glucose-Capillary: 86 mg/dL (ref 70–99)

## 2022-11-13 MED ORDER — FLUDEOXYGLUCOSE F - 18 (FDG) INJECTION
5.7000 | Freq: Once | INTRAVENOUS | Status: AC
  Administered 2022-11-13: 6.12 via INTRAVENOUS

## 2022-11-27 ENCOUNTER — Encounter: Payer: Self-pay | Admitting: Urology

## 2022-11-27 ENCOUNTER — Ambulatory Visit: Payer: PPO | Admitting: Urology

## 2022-11-27 VITALS — BP 160/97 | HR 85 | Ht <= 58 in | Wt 111.0 lb

## 2022-11-27 DIAGNOSIS — N2889 Other specified disorders of kidney and ureter: Secondary | ICD-10-CM | POA: Diagnosis not present

## 2022-11-27 DIAGNOSIS — Z01818 Encounter for other preprocedural examination: Secondary | ICD-10-CM | POA: Diagnosis not present

## 2022-11-27 NOTE — Progress Notes (Signed)
I,Amy L Pierron,acting as a scribe for Hollice Espy, MD.,have documented all relevant documentation on the behalf of Hollice Espy, MD,as directed by  Hollice Espy, MD while in the presence of Hollice Espy, MD.   11/27/22 2:39 PM   Jessica Morrison 15-Sep-1946 RS:[id number]  Referring provider: Idelle Crouch, MD [place] [facility] [place],  [place] [address]  Chief Complaint  Patient presents with   discuss nephroureterectomy    HPI: 77 year-old female who presents today to talk about her options in terms of surgery versus neoadjuvant chemotherapy. 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 She went diagnostic ureteroscopy with Dr.Stoioff in December. She was subsequently referred to medical oncology Dr. Grayland Ormond where she had a PET scan that showed no obvious evidence of metastatic disease.   She is accompanied by her daughter today. After mentioning the options of chemo before or after surgery, she thinks she wants to start with chemotherapy treatment first. She reports no hematuria today. After her stent was removed she reported heavy bleeding but it has stopped since then. She expresses concern that if the infected kidney is removed her remaining one is strong enough. Explained it is is possible to live with one kidney but staying hydrated, checking its function, and being mindful of taking certain medications is important. Details of the nephroureterectomy were discussed. She wants to see Dr. Grayland Ormond asap to get the process started.    PMH: Past Medical History:  Diagnosis Date   Back pain    Basal cell carcinoma 08/04/2020   0.5cm above the left mid brow, EDC 09/15/20   Cancer ([place])    Melanoma   Curvature of spine    Dysplastic nevus 04/30/2013   left post shoulder, left parasternal   Dysplastic nevus 05/04/2014   right prox ant deltoid, left lat buttock   Dysplastic nevus  03/26/2019   right lat neck adjacent to the inf top of MM in situ site scar   Dysplastic nevus 10/29/2019   upper back left paraspinal at level of mid scapula   Dysplastic nevus 03/31/2020, 05/12/20 shave removal   Left mid pretibial. Moderate to severe atypia, deep margin involved.   Dysplastic nevus 06/29/2021   L ant deltoid, moderat to severe atypia, excised 08/15/21   Endometriosis    Endometriosis    Fibrocystic disease of breast    Fibroid    GERD (gastroesophageal reflux disease)    History of kidney stones    HLD (hyperlipidemia)    Hyperlipemia    Hypertension    Kidney stone    Melanoma in situ ([place]) 07/03/2018   right lat neck inf to right angle of mandible, excised: 07/29/2018, margins free   Nephrolithiasis    Osteopenia    Osteoporosis    Pelvic pain in female    Raynaud disease    Thyromegaly     Surgical History: Past Surgical History:  Procedure Laterality Date   ABDOMINAL HYSTERECTOMY     BILATERAL OOPHORECTOMY  1999   CESAREAN SECTION     COLONOSCOPY WITH PROPOFOL N/A 05/09/2015   Procedure: COLONOSCOPY WITH PROPOFOL;  Surgeon: Josefine Class, MD;  Location: [facility] ENDOSCOPY;  Service: Endoscopy;  Laterality: N/A;   CYSTOSCOPY WITH RETROGRADE PYELOGRAM, URETEROSCOPY AND STENT PLACEMENT Left 10/09/2022   Procedure: CYSTOSCOPY WITH RETROGRADE PYELOGRAM, URETEROSCOPY AND STENT PLACEMENT;  Surgeon: Abbie Sons, MD;  Location: [facility] ORS;  Service: Urology;  Laterality: Left;  MELANOMA EXCISION Right 07/29/2018   lateral neck inf. to right angle of mandible   OOPHORECTOMY     URETERAL BIOPSY Left 10/09/2022   Procedure: URETERAL BIOPSY;  Surgeon: Abbie Sons, MD;  Location: [facility] ORS;  Service: Urology;  Laterality: Left;    Home Medications:  Allergies as of 11/27/2022       Reactions   Codeine Nausea And Vomiting   Neosporin Wound Cleanser [benzalkonium Chloride] Other (See Comments)   Skin blisters   Penicillins         Medication  List        Accurate as of November 27, 2022  2:39 PM. If you have any questions, ask your nurse or doctor.          acetaminophen 325 MG tablet Commonly known as: TYLENOL Take 650 mg by mouth every 6 (six) hours as needed.   calcium-vitamin D 500-200 MG-UNIT tablet Commonly known as: OSCAL WITH D 1 tablet daily with breakfast.   Cholecalciferol 25 MCG (1000 UT) tablet Take 1,000 Units by mouth daily.   cyanocobalamin 1000 MCG tablet Take 1,000 mcg by mouth daily.   donepezil 10 MG tablet Commonly known as: ARICEPT TAKE 1 TABLET BY MOUTH ONCE EVERY EVENING   meloxicam 7.5 MG tablet Commonly known as: MOBIC Take 1 tablet by mouth daily.   memantine 5 MG tablet Commonly known as: NAMENDA Take 5 mg by mouth 2 (two) times daily.   omeprazole 40 MG capsule Commonly known as: PRILOSEC 40 mg daily.   raloxifene 60 MG tablet Commonly known as: EVISTA TAKE 1 TABLET BY MOUTH ONCE A DAY   rosuvastatin 5 MG tablet Commonly known as: CRESTOR Take 5 mg by mouth daily.   tamsulosin 0.4 MG Caps capsule Commonly known as: FLOMAX Take by mouth.   traMADol 50 MG tablet Commonly known as: ULTRAM Take 1 tablet (50 mg total) by mouth every 6 (six) hours as needed for moderate pain.   trospium 20 MG tablet Commonly known as: SANCTURA Take 1 tablet (20 mg total) by mouth 2 (two) times daily as needed (frequency,urgency,bladder spasm).        Allergies:  Allergies  Allergen Reactions   Codeine Nausea And Vomiting   Neosporin Wound Cleanser [Benzalkonium Chloride] Other (See Comments)    Skin blisters   Penicillins     Family History: Family History  Problem Relation Age of Onset   Emphysema Father    Cancer Neg Hx    Heart disease Neg Hx    Diabetes Neg Hx    Breast cancer Neg Hx     Social History:  reports that she has never smoked. She has never been exposed to tobacco smoke. She has never used smokeless tobacco. She reports that she does not drink alcohol  and does not use drugs.   Physical Exam: BP (!) 160/97   Pulse 85   Ht 4' 9"$  (1.448 m)   Wt 111 lb (50.3 kg)   BMI 24.02 kg/m   Constitutional:  Alert and oriented, No acute distress. HEENT: [place] AT, moist mucus membranes.  Trachea midline, no masses. Neurologic: Grossly intact, no focal deficits, moving all 4 extremities. Psychiatric: Normal mood and affect.   Assessment & Plan:    Non-invasive upper tract TCC  - Explained options of starting chemotherapy to shrink the tumor before having it removed. She wants to start with this option. Will reach out to Dr. Grayland Ormond to have an appointment for her to be seen as  soon as possible. If the rounds of chemotherapy go as planned, possibly June will be time for the nephroureterectomy. If she does not tolerate the first round of chemotherapy having the surgery sooner may be an option. Explained what is done during the procedure and how we can function on one kidney. At this time she is not presumed to need dialysis. Went over post-operative instructions. Printed some material from online regarding the nephroureterectomy for her to read about.   Return for Plan to keep up with treatment by Dr. Grayland Ormond.   [place] [address] [place], [place] [address] [phone]

## 2022-11-28 LAB — CBC WITH DIFFERENTIAL
Basophils Absolute: 0.1 10*3/uL (ref 0.0–0.2)
Basos: 1 %
EOS (ABSOLUTE): 0.2 10*3/uL (ref 0.0–0.4)
Eos: 3 %
Hematocrit: 37.5 % (ref 34.0–46.6)
Hemoglobin: 12.4 g/dL (ref 11.1–15.9)
Immature Grans (Abs): 0 10*3/uL (ref 0.0–0.1)
Immature Granulocytes: 0 %
Lymphocytes Absolute: 1.4 10*3/uL (ref 0.7–3.1)
Lymphs: 20 %
MCH: 27.7 pg (ref 26.6–33.0)
MCHC: 33.1 g/dL (ref 31.5–35.7)
MCV: 84 fL (ref 79–97)
Monocytes Absolute: 0.7 10*3/uL (ref 0.1–0.9)
Monocytes: 9 %
Neutrophils Absolute: 4.8 10*3/uL (ref 1.4–7.0)
Neutrophils: 67 %
RBC: 4.47 x10E6/uL (ref 3.77–5.28)
RDW: 12.7 % (ref 11.7–15.4)
WBC: 7.1 10*3/uL (ref 3.4–10.8)

## 2022-11-29 ENCOUNTER — Other Ambulatory Visit: Payer: Self-pay | Admitting: Obstetrics and Gynecology

## 2022-11-30 ENCOUNTER — Encounter: Payer: Self-pay | Admitting: Oncology

## 2022-11-30 ENCOUNTER — Telehealth: Payer: Self-pay | Admitting: *Deleted

## 2022-11-30 ENCOUNTER — Inpatient Hospital Stay: Payer: PPO | Attending: Oncology | Admitting: Oncology

## 2022-11-30 VITALS — BP 168/83 | HR 76 | Temp 97.6°F | Resp 16 | Ht <= 58 in | Wt 108.0 lb

## 2022-11-30 DIAGNOSIS — C642 Malignant neoplasm of left kidney, except renal pelvis: Secondary | ICD-10-CM | POA: Diagnosis not present

## 2022-11-30 DIAGNOSIS — D649 Anemia, unspecified: Secondary | ICD-10-CM | POA: Insufficient documentation

## 2022-11-30 DIAGNOSIS — Z5111 Encounter for antineoplastic chemotherapy: Secondary | ICD-10-CM | POA: Insufficient documentation

## 2022-11-30 DIAGNOSIS — C649 Malignant neoplasm of unspecified kidney, except renal pelvis: Secondary | ICD-10-CM | POA: Insufficient documentation

## 2022-11-30 NOTE — Telephone Encounter (Signed)
Call placed to patients daughter to review appointment details for port placement on 2/22, patient to arrive at 12:30 pm for 1:30 pm procedure. Patients daughter verbalized understanding and is in agreement with appointment.

## 2022-11-30 NOTE — Progress Notes (Signed)
START ON PATHWAY REGIMEN - Bladder     A cycle is every 21 days:     Gemcitabine      Cisplatin   **Always confirm dose/schedule in your pharmacy ordering system**  Patient Characteristics: Pre-Cystectomy or Nonsurgical Candidate, M0 (Clinical Staging), cT2-4a, cN0-1, M0, Cystectomy Eligible, Cisplatin-Based Chemotherapy Indicated with CrCl ? 60 mL/min and Minimal or No Symptoms Therapeutic Status: Pre-Cystectomy or Nonsurgical Candidate, M0 (Clinical Staging) AJCC M Category: cM0 AJCC 8 Stage Grouping: Unknown AJCC T Category: cTX AJCC N Category: cN0 Intent of Therapy: Curative Intent, Discussed with Patient

## 2022-11-30 NOTE — Progress Notes (Signed)
[place]  [phone] [phone]  ID: Jessica Morrison OB: [birth year]  MR#: [id number]  [id number]  Patient Care Team: Idelle Crouch, MD as PCP - General (Internal Medicine)  CHIEF COMPLAINT: Urothelial carcinoma of the kidney.  INTERVAL HISTORY: Patient returns to clinic today for further evaluation and discussion of neoadjuvant chemotherapy.  She had consultation with urology who recommended treatment upfront would be beneficial.  She is anxious, but otherwise feels well.  She has no neurologic complaints.  She denies any recent fevers or illnesses.  She has a good appetite and denies weight loss.  She has no chest pain, shortness of breath, cough, or hemoptysis.  She denies any nausea, vomiting, constipation, or diarrhea.  She has no other urinary complaints.  Patient offers no further specific complaints today.  REVIEW OF SYSTEMS:   Review of Systems  Constitutional: Negative.  Negative for fever, malaise/fatigue and weight loss.  Respiratory: Negative.  Negative for cough and shortness of breath.   Cardiovascular: Negative.  Negative for chest pain and leg swelling.  Gastrointestinal: Negative.  Negative for abdominal pain.  Genitourinary:  Positive for hematuria.  Musculoskeletal: Negative.  Negative for back pain.  Skin: Negative.  Negative for rash.  Neurological: Negative.  Negative for dizziness, focal weakness, weakness and headaches.  Psychiatric/Behavioral:  The patient is nervous/anxious.     As per HPI. Otherwise, a complete review of systems is negative.  PAST MEDICAL HISTORY: Past Medical History:  Diagnosis Date   Back pain    Basal cell carcinoma 08/04/2020   0.5cm above the left mid brow, EDC 09/15/20   Cancer ([place])    Melanoma   Curvature of spine    Dysplastic nevus 04/30/2013   left post shoulder, left parasternal   Dysplastic nevus 05/04/2014   right prox ant deltoid, left lat buttock   Dysplastic  nevus 03/26/2019   right lat neck adjacent to the inf top of MM in situ site scar   Dysplastic nevus 10/29/2019   upper back left paraspinal at level of mid scapula   Dysplastic nevus 03/31/2020, 05/12/20 shave removal   Left mid pretibial. Moderate to severe atypia, deep margin involved.   Dysplastic nevus 06/29/2021   L ant deltoid, moderat to severe atypia, excised 08/15/21   Endometriosis    Endometriosis    Fibrocystic disease of breast    Fibroid    GERD (gastroesophageal reflux disease)    History of kidney stones    HLD (hyperlipidemia)    Hyperlipemia    Hypertension    Kidney stone    Melanoma in situ ([place]) 07/03/2018   right lat neck inf to right angle of mandible, excised: 07/29/2018, margins free   Nephrolithiasis    Osteopenia    Osteoporosis    Pelvic pain in female    Raynaud disease    Thyromegaly     PAST SURGICAL HISTORY: Past Surgical History:  Procedure Laterality Date   ABDOMINAL HYSTERECTOMY     BILATERAL OOPHORECTOMY  1999   CESAREAN SECTION     COLONOSCOPY WITH PROPOFOL N/A 05/09/2015   Procedure: COLONOSCOPY WITH PROPOFOL;  Surgeon: Josefine Class, MD;  Location: [facility] ENDOSCOPY;  Service: Endoscopy;  Laterality: N/A;   CYSTOSCOPY WITH RETROGRADE PYELOGRAM, URETEROSCOPY AND STENT PLACEMENT Left 10/09/2022   Procedure: CYSTOSCOPY WITH RETROGRADE PYELOGRAM, URETEROSCOPY AND STENT PLACEMENT;  Surgeon: Abbie Sons, MD;  Location: [facility] ORS;  Service: Urology;  Laterality: Left;   MELANOMA EXCISION Right 07/29/2018   lateral  neck inf. to right angle of mandible   OOPHORECTOMY     URETERAL BIOPSY Left 10/09/2022   Procedure: URETERAL BIOPSY;  Surgeon: Abbie Sons, MD;  Location: [facility] ORS;  Service: Urology;  Laterality: Left;    FAMILY HISTORY: Family History  Problem Relation Age of Onset   Emphysema Father    Cancer Neg Hx    Heart disease Neg Hx    Diabetes Neg Hx    Breast cancer Neg Hx     ADVANCED DIRECTIVES (Y/N):   N  HEALTH MAINTENANCE: Social History   Tobacco Use   Smoking status: Never    Passive exposure: Never   Smokeless tobacco: Never  Vaping Use   Vaping Use: Never used  Substance Use Topics   Alcohol use: No   Drug use: No     Colonoscopy:  PAP:  Bone density:  Lipid panel:  Allergies  Allergen Reactions   Codeine Nausea And Vomiting   Neosporin Wound Cleanser [Benzalkonium Chloride] Other (See Comments)    Skin blisters   Penicillins     Current Outpatient Medications  Medication Sig Dispense Refill   acetaminophen (TYLENOL) 325 MG tablet Take 650 mg by mouth every 6 (six) hours as needed.     calcium-vitamin D (OSCAL WITH D) 500-200 MG-UNIT tablet 1 tablet daily with breakfast.     Cholecalciferol 25 MCG (1000 UT) tablet Take 1,000 Units by mouth daily.     cyanocobalamin 1000 MCG tablet Take 1,000 mcg by mouth daily.     donepezil (ARICEPT) 10 MG tablet TAKE 1 TABLET BY MOUTH ONCE EVERY EVENING     memantine (NAMENDA) 5 MG tablet Take 5 mg by mouth 2 (two) times daily.     omeprazole (PRILOSEC) 40 MG capsule 40 mg daily.     raloxifene (EVISTA) 60 MG tablet TAKE 1 TABLET BY MOUTH ONCE A DAY 90 tablet 1   rosuvastatin (CRESTOR) 5 MG tablet Take 5 mg by mouth daily.     trospium (SANCTURA) 20 MG tablet Take 1 tablet (20 mg total) by mouth 2 (two) times daily as needed (frequency,urgency,bladder spasm). 20 tablet 0   lidocaine-prilocaine (EMLA) cream Apply to affected area once 30 g 3   meloxicam (MOBIC) 7.5 MG tablet Take 1 tablet by mouth daily. (Patient not taking: Reported on 11/30/2022)     ondansetron (ZOFRAN) 8 MG tablet Take 1 tablet (8 mg total) by mouth every 8 (eight) hours as needed for nausea or vomiting. Start on the third day after cisplatin. 60 tablet 2   prochlorperazine (COMPAZINE) 10 MG tablet Take 1 tablet (10 mg total) by mouth every 6 (six) hours as needed (Nausea or vomiting). 60 tablet 2   tamsulosin (FLOMAX) 0.4 MG CAPS capsule Take by mouth.  (Patient not taking: Reported on 11/30/2022)     traMADol (ULTRAM) 50 MG tablet Take 1 tablet (50 mg total) by mouth every 6 (six) hours as needed for moderate pain. (Patient not taking: Reported on 11/30/2022) 8 tablet 0   No current facility-administered medications for this visit.    OBJECTIVE: Vitals:   11/30/22 0943  BP: (!) 168/83  Pulse: 76  Resp: 16  Temp: 97.6 F (36.4 C)  SpO2: 100%     Body mass index is 23.37 kg/m.    ECOG FS:0 - Asymptomatic  General: Well-developed, well-nourished, no acute distress. Eyes: Pink conjunctiva, anicteric sclera. HEENT: Normocephalic, moist mucous membranes. Lungs: No audible wheezing or coughing. Heart: Regular rate and rhythm. Abdomen: Soft,  nontender, no obvious distention. Musculoskeletal: No edema, cyanosis, or clubbing. Neuro: Alert, answering all questions appropriately. Cranial nerves grossly intact. Skin: No rashes or petechiae noted. Psych: Normal affect.  LAB RESULTS:  Lab Results  Component Value Date   CREATININE 1.30 (H) 09/25/2022    Lab Results  Component Value Date   WBC 7.1 11/27/2022   NEUTROABS 4.8 11/27/2022   HGB 12.4 11/27/2022   HCT 37.5 11/27/2022   MCV 84 11/27/2022     STUDIES: NM PET Image Initial (PI) Skull Base To Thigh  Result Date: 11/14/2022 CLINICAL DATA:  Initial treatment strategy for uroepithelial carcinoma of the left kidney. EXAM: NUCLEAR MEDICINE PET SKULL BASE TO THIGH TECHNIQUE: 6.12 mCi F-18 FDG was injected intravenously. Full-ring PET imaging was performed from the skull base to thigh after the radiotracer. CT data was obtained and used for attenuation correction and anatomic localization. Fasting blood glucose: 86 mg/dl COMPARISON:  CT scan 09/25/2022 FINDINGS: Mediastinal blood pool activity: SUV max 2.04 Liver activity: SUV max NA NECK: No hypermetabolic lymph nodes in the neck. Incidental CT findings: None. CHEST: No hypermetabolic mediastinal or hilar nodes. No suspicious  pulmonary nodules on the CT scan. No hypermetabolic breast masses, supraclavicular or axillary adenopathy. Incidental CT findings: Atherosclerotic calcifications involving the aorta and coronary arteries. Significant pectus deformity with flattening of the right ventricle and mild cardiac enlargement. ABDOMEN/PELVIS: No hypermetabolism associated with the left renal lesion. No enlarged or hypermetabolic mesenteric or retroperitoneal lymph nodes. No findings for metastatic disease involving the liver or adrenal glands. No obvious bladder abnormality. Incidental CT findings: Stable scattered vascular calcifications. Stable large calcification in the pelvis. SKELETON: No focal hypermetabolic activity to suggest skeletal metastasis. Incidental CT findings: Significant scoliosis and associated degenerative lumbar spondylosis. IMPRESSION: The left upper pole collecting system mass does not show any hypermetabolism and there is no evidence of metastatic disease involving the chest, abdomen/pelvis or bony structures. Electronically Signed   By: Marijo Sanes M.D.   On: 11/14/2022 08:47    ASSESSMENT: Urothelial carcinoma of the kidney.  PLAN:    Urothelial carcinoma of the kidney: Confirmed by biopsy.  PET scan results on November 14, 2022 reviewed independently and report as above with no significant hypermetabolism of known malignancy.  Imaging does not reveal any metastatic disease.  After consultation with Dr. Erlene Quan, it is recommended that patient pursue neoadjuvant chemotherapy followed by surgical resection.  Patient will receive cisplatin and gemcitabine on day 1 with gemcitabine on day 8 and a 21-day cycle.  Plan to give 4 cycles and then refer back to urology for surgical resection.  Prior to initiating treatment, patient will require port placement.  Return to clinic on December 11, 2022 to initiate cycle 1, day 1 of treatment.   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  I spent a  total of 30 minutes reviewing chart data, face-to-face evaluation with the patient, counseling and coordination of care as detailed above.    Patient expressed understanding and was in agreement with this plan. She also understands that She can call clinic at any time with any questions, concerns, or complaints.    Cancer Staging  Urothelial carcinoma of kidney [facility]) Staging form: Kidney, AJCC 8th Edition - Clinical stage from 12/01/2022: Stage Unknown (cTX, cN0, cM0) - Signed by Lloyd Huger, MD on 12/01/2022 Stage prefix: Initial diagnosis Histologic grade (G): G3 Histologic grading system: 4 grade system   Lloyd Huger, MD   12/01/2022 7:44 AM

## 2022-12-01 ENCOUNTER — Encounter: Payer: Self-pay | Admitting: Oncology

## 2022-12-01 MED ORDER — PROCHLORPERAZINE MALEATE 10 MG PO TABS: 10.0000 mg | ORAL_TABLET | Freq: Four times a day (QID) | ORAL | 2 refills | Status: DC | PRN

## 2022-12-01 MED ORDER — ONDANSETRON HCL 8 MG PO TABS: 8.0000 mg | ORAL_TABLET | Freq: Three times a day (TID) | ORAL | 2 refills | Status: DC | PRN

## 2022-12-01 MED ORDER — LIDOCAINE-PRILOCAINE 2.5-2.5 % EX CREA: TOPICAL_CREAM | CUTANEOUS | 3 refills | Status: DC

## 2022-12-02 ENCOUNTER — Other Ambulatory Visit: Payer: Self-pay

## 2022-12-03 ENCOUNTER — Inpatient Hospital Stay: Payer: PPO

## 2022-12-05 ENCOUNTER — Other Ambulatory Visit: Payer: Self-pay | Admitting: Student

## 2022-12-05 DIAGNOSIS — C642 Malignant neoplasm of left kidney, except renal pelvis: Secondary | ICD-10-CM

## 2022-12-05 NOTE — Progress Notes (Signed)
Patient for IR Port Insertion on Thurs 12/06/2022, I called and spoke with the patient's daughter, Jessica Morrison on the phone and gave pre-procedure instructions.  Pt's daughter was made aware to have the patient here at 12:30p at the new entrance, NPO after MN prior to procedure as well as driver post procedure/recovery/discharge. Pt's daughter stated understanding.  Called 12/05/2022

## 2022-12-06 ENCOUNTER — Encounter: Payer: Self-pay | Admitting: Radiology

## 2022-12-06 ENCOUNTER — Encounter: Payer: Self-pay | Admitting: Oncology

## 2022-12-06 ENCOUNTER — Ambulatory Visit
Admission: RE | Admit: 2022-12-06 | Discharge: 2022-12-06 | Disposition: A | Discharge status: Home / Self Care | Payer: PPO | Source: Ambulatory Visit | Attending: Oncology | Admitting: Oncology

## 2022-12-06 DIAGNOSIS — C642 Malignant neoplasm of left kidney, except renal pelvis: Secondary | ICD-10-CM | POA: Diagnosis not present

## 2022-12-06 DIAGNOSIS — Z452 Encounter for adjustment and management of vascular access device: Secondary | ICD-10-CM | POA: Diagnosis not present

## 2022-12-06 HISTORY — PX: IR IMAGING GUIDED PORT INSERTION: IMG5740

## 2022-12-06 MED ORDER — FENTANYL CITRATE (PF) 100 MCG/2ML IJ SOLN
INTRAMUSCULAR | Status: AC | PRN
  Administered 2022-12-06: 50 ug via INTRAVENOUS
  Administered 2022-12-06: 25 ug via INTRAVENOUS

## 2022-12-06 MED ORDER — FENTANYL CITRATE (PF) 100 MCG/2ML IJ SOLN
INTRAMUSCULAR | Status: AC
  Filled 2022-12-06: qty 2

## 2022-12-06 MED ORDER — SODIUM CHLORIDE 0.9 % IV SOLN: INTRAVENOUS | Status: DC

## 2022-12-06 MED ORDER — LIDOCAINE-EPINEPHRINE 1 %-1:100000 IJ SOLN
INTRAMUSCULAR | Status: AC
  Administered 2022-12-06: 10 mL
  Filled 2022-12-06: qty 1

## 2022-12-06 MED ORDER — ONDANSETRON HCL 4 MG/2ML IJ SOLN
INTRAMUSCULAR | Status: AC | PRN
  Administered 2022-12-06: 4 mg via INTRAVENOUS

## 2022-12-06 MED ORDER — HEPARIN SOD (PORK) LOCK FLUSH 100 UNIT/ML IV SOLN
INTRAVENOUS | Status: AC
  Administered 2022-12-06: 500 [IU]
  Filled 2022-12-06: qty 5

## 2022-12-06 MED ORDER — MIDAZOLAM HCL 2 MG/2ML IJ SOLN
INTRAMUSCULAR | Status: AC
  Filled 2022-12-06: qty 2

## 2022-12-06 MED ORDER — ONDANSETRON HCL 4 MG/2ML IJ SOLN
INTRAMUSCULAR | Status: AC
  Filled 2022-12-06: qty 2

## 2022-12-06 MED ORDER — MIDAZOLAM HCL 5 MG/5ML IJ SOLN
INTRAMUSCULAR | Status: AC | PRN
  Administered 2022-12-06: .5 mg via INTRAVENOUS
  Administered 2022-12-06: 1 mg via INTRAVENOUS

## 2022-12-06 NOTE — H&P (Signed)
Chief Complaint: Patient was seen in consultation today for port a catheter placement at the request of Finnegan,Timothy J  Referring Physician(s): Finnegan,Timothy J  Supervising Physician: Juliet Rude  Patient Status: [facility] - Out-pt  History of Present Illness: Jessica Morrison is a 77 y.o. 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 The patient has been referred to IR today for port placement for chemotherapy.   The patient denies any current chest pain or shortness of breath. The patient denies any recent infections, fever or chills. The patient denies any history of sleep apnea or chronic oxygen use. She has no known complications to moderate sedation, however did have some nausea and vomiting during the kidney biopsy.   Past Medical History:  Diagnosis Date   Back pain    Basal cell carcinoma 08/04/2020   0.5cm above the left mid brow, EDC 09/15/20   Cancer ([place])    Melanoma   Curvature of spine    Dysplastic nevus 04/30/2013   left post shoulder, left parasternal   Dysplastic nevus 05/04/2014   right prox ant deltoid, left lat buttock   Dysplastic nevus 03/26/2019   right lat neck adjacent to the inf top of MM in situ site scar   Dysplastic nevus 10/29/2019   upper back left paraspinal at level of mid scapula   Dysplastic nevus 03/31/2020, 05/12/20 shave removal   Left mid pretibial. Moderate to severe atypia, deep margin involved.   Dysplastic nevus 06/29/2021   L ant deltoid, moderat to severe atypia, excised 08/15/21   Endometriosis    Endometriosis    Fibrocystic disease of breast    Fibroid    GERD (gastroesophageal reflux disease)    History of kidney stones    HLD (hyperlipidemia)    Hyperlipemia    Hypertension    Kidney stone    Melanoma in situ ([place]) 07/03/2018   right lat neck inf to right angle of mandible, excised: 07/29/2018, margins  free   Nephrolithiasis    Osteopenia    Osteoporosis    Pelvic pain in female    Raynaud disease    Thyromegaly     Past Surgical History:  Procedure Laterality Date   ABDOMINAL HYSTERECTOMY     BILATERAL OOPHORECTOMY  1999   CESAREAN SECTION     COLONOSCOPY WITH PROPOFOL N/A 05/09/2015   Procedure: COLONOSCOPY WITH PROPOFOL;  Surgeon: Josefine Class, MD;  Location: [facility] ENDOSCOPY;  Service: Endoscopy;  Laterality: N/A;   CYSTOSCOPY WITH RETROGRADE PYELOGRAM, URETEROSCOPY AND STENT PLACEMENT Left 10/09/2022   Procedure: CYSTOSCOPY WITH RETROGRADE PYELOGRAM, URETEROSCOPY AND STENT PLACEMENT;  Surgeon: Abbie Sons, MD;  Location: [facility] ORS;  Service: Urology;  Laterality: Left;   MELANOMA EXCISION Right 07/29/2018   lateral neck inf. to right angle of mandible   OOPHORECTOMY     URETERAL BIOPSY Left 10/09/2022   Procedure: URETERAL BIOPSY;  Surgeon: Abbie Sons, MD;  Location: [facility] ORS;  Service: Urology;  Laterality: Left;    Allergies: Codeine, Neosporin wound cleanser [benzalkonium chloride], and Penicillins  Medications: Prior to Admission medications   Medication Sig Start Date End Date Taking? Authorizing Provider  acetaminophen (TYLENOL) 325 MG tablet Take 650 mg by mouth every 6 (six) hours as needed.    [provider]  calcium-vitamin D (OSCAL WITH D) 500-200 MG-UNIT tablet 1 tablet daily with breakfast.    [provider]  Cholecalciferol 25  MCG (1000 UT) tablet Take 1,000 Units by mouth daily.    [provider]  cyanocobalamin 1000 MCG tablet Take 1,000 mcg by mouth daily.    [provider]  donepezil (ARICEPT) 10 MG tablet TAKE 1 TABLET BY MOUTH ONCE EVERY EVENING 08/06/21   [provider]  lidocaine-prilocaine (EMLA) cream Apply to affected area once 12/01/22   Lloyd Huger, MD  meloxicam (MOBIC) 7.5 MG tablet Take 1 tablet by mouth daily. Patient not taking: Reported on 11/30/2022 06/20/22   [provider]  memantine (NAMENDA) 5 MG tablet Take 5 mg by mouth 2 (two) times daily. 05/10/21   [provider]  omeprazole (PRILOSEC) 40 MG capsule 40 mg daily. 10/29/19   [provider]  ondansetron (ZOFRAN) 8 MG tablet Take 1 tablet (8 mg total) by mouth every 8 (eight) hours as needed for nausea or vomiting. Start on the third day after cisplatin. 12/01/22   Lloyd Huger, MD  prochlorperazine (COMPAZINE) 10 MG tablet Take 1 tablet (10 mg total) by mouth every 6 (six) hours as needed (Nausea or vomiting). 12/01/22   Lloyd Huger, MD  raloxifene (EVISTA) 60 MG tablet TAKE 1 TABLET BY MOUTH ONCE A DAY 11/29/22   Harlin Heys, MD  rosuvastatin (CRESTOR) 5 MG tablet Take 5 mg by mouth daily.    [provider]  tamsulosin (FLOMAX) 0.4 MG CAPS capsule Take by mouth. Patient not taking: Reported on 11/30/2022 09/21/22 09/21/23  [provider]  traMADol (ULTRAM) 50 MG tablet Take 1 tablet (50 mg total) by mouth every 6 (six) hours as needed for moderate pain. Patient not taking: Reported on 11/30/2022 10/09/22   Abbie Sons, MD  trospium (SANCTURA) 20 MG tablet Take 1 tablet (20 mg total) by mouth 2 (two) times daily as needed (frequency,urgency,bladder spasm). 10/09/22   Abbie Sons, MD     Family History  Problem Relation Age of Onset   Emphysema Father    Cancer Neg Hx    Heart disease Neg Hx    Diabetes Neg Hx    Breast cancer Neg Hx     Social History   Socioeconomic History   Marital status: Widowed    Spouse name: Not on file   Number of children: Not on file   Years of education: Not on file   Highest education level: Not on file  Occupational History   Not on file  Tobacco Use   Smoking status: Never    Passive exposure: Never   Smokeless tobacco: Never  Vaping Use   Vaping Use: Never used  Substance and Sexual Activity   Alcohol use: No   Drug use: No   Sexual activity: Not Currently    Birth  control/protection: Surgical  Other Topics Concern   Not on file  Social History Narrative   Not on file   Social Determinants of Health   Financial Resource Strain: Not on file  Food Insecurity: Not on file  Transportation Needs: Not on file  Physical Activity: Insufficiently Active (08/27/2017)   Exercise Vital Sign    Days of Exercise per Week: 2 days    Minutes of Exercise per Session: 60 min  Stress: Stress Concern Present (08/27/2017)   [place]    Feeling of Stress : To some extent  Social Connections: Not on file    Review of Systems: A 12 point ROS discussed and pertinent positives are  indicated in the HPI above.  All other systems are negative.  Review of Systems  Vital Signs: BP (!) 164/74   Pulse 80   Temp 97.9 F (36.6 C) (Oral)   Resp 19   Ht '4\' 7"'$  (1.397 m)   Wt 108 lb (49 kg)   SpO2 99%   BMI 25.10 kg/m   Physical Exam Constitutional:      General: She is not in acute distress.    Appearance: Normal appearance.  HENT:     Head: Normocephalic and atraumatic.  Cardiovascular:     Rate and Rhythm: Normal rate and regular rhythm.  Pulmonary:     Effort: Pulmonary effort is normal. No respiratory distress.  Skin:    General: Skin is warm and dry.  Neurological:     Mental Status: She is alert and oriented to person, place, and time.     Imaging: NM PET Image Initial (PI) Skull Base To Thigh  Result Date: 11/14/2022 CLINICAL DATA:  Initial treatment strategy for uroepithelial carcinoma of the left kidney. EXAM: NUCLEAR MEDICINE PET SKULL BASE TO THIGH TECHNIQUE: 6.12 mCi F-18 FDG was injected intravenously. Full-ring PET imaging was performed from the skull base to thigh after the radiotracer. CT data was obtained and used for attenuation correction and anatomic localization. Fasting blood glucose: 86 mg/dl COMPARISON:  CT scan 09/25/2022 FINDINGS: Mediastinal blood pool activity:  SUV max 2.04 Liver activity: SUV max NA NECK: No hypermetabolic lymph nodes in the neck. Incidental CT findings: None. CHEST: No hypermetabolic mediastinal or hilar nodes. No suspicious pulmonary nodules on the CT scan. No hypermetabolic breast masses, supraclavicular or axillary adenopathy. Incidental CT findings: Atherosclerotic calcifications involving the aorta and coronary arteries. Significant pectus deformity with flattening of the right ventricle and mild cardiac enlargement. ABDOMEN/PELVIS: No hypermetabolism associated with the left renal lesion. No enlarged or hypermetabolic mesenteric or retroperitoneal lymph nodes. No findings for metastatic disease involving the liver or adrenal glands. No obvious bladder abnormality. Incidental CT findings: Stable scattered vascular calcifications. Stable large calcification in the pelvis. SKELETON: No focal hypermetabolic activity to suggest skeletal metastasis. Incidental CT findings: Significant scoliosis and associated degenerative lumbar spondylosis. IMPRESSION: The left upper pole collecting system mass does not show any hypermetabolism and there is no evidence of metastatic disease involving the chest, abdomen/pelvis or bony structures. Electronically Signed   By: Marijo Sanes M.D.   On: 11/14/2022 08:47    Labs:  CBC: Recent Labs    11/27/22 1200  WBC 7.1  HGB 12.4  HCT 37.5    COAGS: No results for input(s): "INR", "APTT" in the last 8760 hours.  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 The patient has been referred to IR today for port placement for chemotherapy.   The patient has been NPO, labs and vitals have been reviewed.  Risks and benefits of image guided port-a-catheter placement was discussed with the patient  including, but not limited to bleeding, infection, pneumothorax, or fibrin sheath development and need for additional procedures.  All of the patient's questions were answered, patient is agreeable to proceed. Consent signed and in chart.   Thank you for this interesting consult.  I greatly enjoyed meeting Jessica Morrison and look forward to participating in their care.  A copy of  this report was sent to the requesting provider on this date.  Electronically Signed: Hedy Jacob, PA-C 12/06/2022, 1:23 PM   I spent a total of  15 Minutes  in face to face in clinical consultation, greater than 50% of which was counseling/coordinating care for port a catheter placement.

## 2022-12-06 NOTE — Progress Notes (Signed)
Patient clinically stable post Port placement per Dr Denna Haggard, tolerated well. Vitals stable pre and post procedure. Received Versed 1.5 mg along with Fentanyl 75 mcg IV for procedure. Denies complaints post procedure. Daughter at bedside post procedure with update given. Report given to Fransico Michael RN post procedure/332

## 2022-12-06 NOTE — Procedures (Signed)
Interventional Radiology Procedure Note  Date of Procedure: 12/06/2022  Procedure: Port placement   Findings:  1. Port placement right chest    Complications: No immediate complications noted.   Estimated Blood Loss: minimal  Follow-up and Recommendations: 1. Ready for use    Albin Felling, MD  Vascular & Interventional Radiology  12/06/2022 3:18 PM

## 2022-12-10 MED FILL — Fosaprepitant Dimeglumine For IV Infusion 150 MG (Base Eq): INTRAVENOUS | Qty: 5 | Status: AC

## 2022-12-10 MED FILL — Dexamethasone Sodium Phosphate Inj 100 MG/10ML: INTRAMUSCULAR | Qty: 1 | Status: AC

## 2022-12-11 ENCOUNTER — Inpatient Hospital Stay: Payer: PPO

## 2022-12-11 ENCOUNTER — Encounter: Payer: Self-pay | Admitting: Oncology

## 2022-12-11 ENCOUNTER — Inpatient Hospital Stay (HOSPITAL_BASED_OUTPATIENT_CLINIC_OR_DEPARTMENT_OTHER): Payer: PPO | Admitting: Oncology

## 2022-12-11 VITALS — BP 137/73 | HR 92 | Resp 16

## 2022-12-11 DIAGNOSIS — C642 Malignant neoplasm of left kidney, except renal pelvis: Secondary | ICD-10-CM

## 2022-12-11 DIAGNOSIS — Z5111 Encounter for antineoplastic chemotherapy: Secondary | ICD-10-CM | POA: Diagnosis not present

## 2022-12-11 LAB — CMP (CANCER CENTER ONLY)
ALT: 13 U/L (ref 0–44)
AST: 20 U/L (ref 15–41)
Albumin: 4.3 g/dL (ref 3.5–5.0)
Alkaline Phosphatase: 47 U/L (ref 38–126)
Anion gap: 10 (ref 5–15)
BUN: 23 mg/dL (ref 8–23)
CO2: 23 mmol/L (ref 22–32)
Calcium: 9.5 mg/dL (ref 8.9–10.3)
Chloride: 104 mmol/L (ref 98–111)
Creatinine: 1.45 mg/dL — ABNORMAL HIGH (ref 0.44–1.00)
GFR, Estimated: 37 mL/min — ABNORMAL LOW (ref >60)
Glucose, Bld: 114 mg/dL — ABNORMAL HIGH (ref 70–99)
Potassium: 4.1 mmol/L (ref 3.5–5.1)
Sodium: 137 mmol/L (ref 135–145)
Total Bilirubin: 0.8 mg/dL (ref 0.3–1.2)
Total Protein: 6.9 g/dL (ref 6.5–8.1)

## 2022-12-11 LAB — CBC WITH DIFFERENTIAL (CANCER CENTER ONLY)
Abs Immature Granulocytes: 0.02 10*3/uL (ref 0.00–0.07)
Basophils Absolute: 0 10*3/uL (ref 0.0–0.1)
Basophils Relative: 1 %
Eosinophils Absolute: 0.2 10*3/uL (ref 0.0–0.5)
Eosinophils Relative: 3 %
HCT: 36.7 % (ref 36.0–46.0)
Hemoglobin: 11.7 g/dL — ABNORMAL LOW (ref 12.0–15.0)
Immature Granulocytes: 0 %
Lymphocytes Relative: 20 %
Lymphs Abs: 1.3 10*3/uL (ref 0.7–4.0)
MCH: 27.1 pg (ref 26.0–34.0)
MCHC: 31.9 g/dL (ref 30.0–36.0)
MCV: 85 fL (ref 80.0–100.0)
Monocytes Absolute: 0.6 10*3/uL (ref 0.1–1.0)
Monocytes Relative: 10 %
Neutro Abs: 4.4 10*3/uL (ref 1.7–7.7)
Neutrophils Relative %: 66 %
Platelet Count: 187 10*3/uL (ref 150–400)
RBC: 4.32 MIL/uL (ref 3.87–5.11)
RDW: 12.8 % (ref 11.5–15.5)
WBC Count: 6.5 10*3/uL (ref 4.0–10.5)
nRBC: 0 % (ref 0.0–0.2)

## 2022-12-11 LAB — MAGNESIUM: Magnesium: 2.2 mg/dL (ref 1.7–2.4)

## 2022-12-11 MED ORDER — SODIUM CHLORIDE 0.9 % IV SOLN
150.0000 mg | Freq: Once | INTRAVENOUS | Status: AC
  Administered 2022-12-11: 150 mg via INTRAVENOUS
  Filled 2022-12-11: qty 150

## 2022-12-11 MED ORDER — SODIUM CHLORIDE 0.9 % IV SOLN
70.0000 mg/m2 | Freq: Once | INTRAVENOUS | Status: AC
  Administered 2022-12-11: 100 mg via INTRAVENOUS
  Filled 2022-12-11: qty 100

## 2022-12-11 MED ORDER — MAGNESIUM SULFATE 2 GM/50ML IV SOLN
2.0000 g | Freq: Once | INTRAVENOUS | Status: AC
  Administered 2022-12-11: 2 g via INTRAVENOUS
  Filled 2022-12-11: qty 50

## 2022-12-11 MED ORDER — SODIUM CHLORIDE 0.9% FLUSH
10.0000 mL | INTRAVENOUS | Status: DC | PRN
  Administered 2022-12-11: 10 mL
  Filled 2022-12-11: qty 10

## 2022-12-11 MED ORDER — SODIUM CHLORIDE 0.9 % IV SOLN
Freq: Once | INTRAVENOUS | Status: AC
  Filled 2022-12-11: qty 250

## 2022-12-11 MED ORDER — POTASSIUM CHLORIDE IN NACL 20-0.9 MEQ/L-% IV SOLN
Freq: Once | INTRAVENOUS | Status: AC
  Filled 2022-12-11: qty 1000

## 2022-12-11 MED ORDER — SODIUM CHLORIDE 0.9 % IV SOLN
10.0000 mg | Freq: Once | INTRAVENOUS | Status: AC
  Administered 2022-12-11: 10 mg via INTRAVENOUS
  Filled 2022-12-11: qty 10

## 2022-12-11 MED ORDER — SODIUM CHLORIDE 0.9 % IV SOLN
1400.0000 mg | Freq: Once | INTRAVENOUS | Status: AC
  Administered 2022-12-11: 1399 mg via INTRAVENOUS
  Filled 2022-12-11: qty 10.52

## 2022-12-11 MED ORDER — HEPARIN SOD (PORK) LOCK FLUSH 100 UNIT/ML IV SOLN
500.0000 [IU] | Freq: Once | INTRAVENOUS | Status: AC | PRN
  Administered 2022-12-11: 500 [IU]
  Filled 2022-12-11: qty 5

## 2022-12-11 MED ORDER — PALONOSETRON HCL INJECTION 0.25 MG/5ML
0.2500 mg | Freq: Once | INTRAVENOUS | Status: AC
  Administered 2022-12-11: 0.25 mg via INTRAVENOUS
  Filled 2022-12-11: qty 5

## 2022-12-11 NOTE — Progress Notes (Signed)
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  INTERVAL HISTORY: Patient returns to clinic today for further evaluation and consideration of cycle 1, day 1 of cisplatin and gemcitabine.  She is anxious, but otherwise feels well.  She has no neurologic complaints.  She denies any recent fevers or illnesses.  She has a good appetite and denies weight loss.  She has no chest pain, shortness of breath, cough, or hemoptysis.  She denies any nausea, vomiting, constipation, or diarrhea.  She has no other urinary complaints.  Patient offers no further specific complaints today.  REVIEW OF SYSTEMS:   Review of Systems  Constitutional: Negative.  Negative for fever, malaise/fatigue and weight loss.  Respiratory: Negative.  Negative for cough and shortness of breath.   Cardiovascular: Negative.  Negative for chest pain and leg swelling.  Gastrointestinal: Negative.  Negative for abdominal pain.  Genitourinary:  Positive for hematuria.  Musculoskeletal: Negative.  Negative for back pain.  Skin: Negative.  Negative for rash.  Neurological: Negative.  Negative for dizziness, focal weakness, weakness and headaches.  Psychiatric/Behavioral:  The patient is nervous/anxious.     As per HPI. Otherwise, a complete review of systems is negative.  PAST MEDICAL HISTORY: Past Medical History:  Diagnosis Date   Back pain    Basal cell carcinoma 08/04/2020   0.5cm above the left mid brow, EDC 09/15/20   Cancer ([place])    Melanoma   Curvature of spine    Dysplastic nevus 04/30/2013   left post shoulder, left parasternal   Dysplastic nevus 05/04/2014   right prox ant deltoid, left lat buttock   Dysplastic nevus 03/26/2019   right lat neck adjacent to the inf top of MM in situ  site scar   Dysplastic nevus 10/29/2019   upper back left paraspinal at level of mid scapula   Dysplastic nevus 03/31/2020, 05/12/20 shave removal   Left mid pretibial. Moderate to severe atypia, deep margin involved.   Dysplastic nevus 06/29/2021   L ant deltoid, moderat to severe atypia, excised 08/15/21   Endometriosis    Endometriosis    Fibrocystic disease of breast    Fibroid    GERD (gastroesophageal reflux disease)    History of kidney stones    HLD (hyperlipidemia)    Hyperlipemia    Hypertension    Kidney stone    Melanoma in situ ([place]) 07/03/2018   right lat neck inf to right angle of mandible, excised: 07/29/2018, margins free   Nephrolithiasis    Osteopenia    Osteoporosis    Pelvic pain in female    Raynaud disease    Thyromegaly     PAST SURGICAL HISTORY: Past Surgical History:  Procedure Laterality Date   ABDOMINAL HYSTERECTOMY     BILATERAL OOPHORECTOMY  1999   CESAREAN SECTION     COLONOSCOPY WITH PROPOFOL N/A 05/09/2015   Procedure: COLONOSCOPY WITH PROPOFOL;  Surgeon: Josefine Class, MD;  Location: [facility] ENDOSCOPY;  Service: Endoscopy;  Laterality: N/A;   CYSTOSCOPY WITH RETROGRADE PYELOGRAM, URETEROSCOPY AND STENT PLACEMENT Left 10/09/2022   Procedure: CYSTOSCOPY WITH RETROGRADE PYELOGRAM, URETEROSCOPY AND STENT PLACEMENT;  Surgeon: Abbie Sons, MD;  Location: [facility] ORS;  Service: Urology;  Laterality: Left;   IR IMAGING GUIDED PORT INSERTION  12/06/2022   MELANOMA EXCISION Right 07/29/2018  lateral neck inf. to right angle of mandible   OOPHORECTOMY     URETERAL BIOPSY Left 10/09/2022   Procedure: URETERAL BIOPSY;  Surgeon: Abbie Sons, MD;  Location: [facility] ORS;  Service: Urology;  Laterality: Left;    FAMILY HISTORY: Family History  Problem Relation Age of Onset   Emphysema Father    Cancer Neg Hx    Heart disease Neg Hx    Diabetes Neg Hx    Breast cancer Neg Hx     ADVANCED DIRECTIVES (Y/N):  N  HEALTH MAINTENANCE: Social  History   Tobacco Use   Smoking status: Never    Passive exposure: Never   Smokeless tobacco: Never  Vaping Use   Vaping Use: Never used  Substance Use Topics   Alcohol use: No   Drug use: No     Colonoscopy:  PAP:  Bone density:  Lipid panel:  Allergies  Allergen Reactions   Codeine Nausea And Vomiting   Neosporin Wound Cleanser [Benzalkonium Chloride] Other (See Comments)    Skin blisters   Penicillins     Current Outpatient Medications  Medication Sig Dispense Refill   acetaminophen (TYLENOL) 325 MG tablet Take 650 mg by mouth every 6 (six) hours as needed.     calcium-vitamin D (OSCAL WITH D) 500-200 MG-UNIT tablet 1 tablet daily with breakfast.     Cholecalciferol 25 MCG (1000 UT) tablet Take 1,000 Units by mouth daily.     cyanocobalamin 1000 MCG tablet Take 1,000 mcg by mouth daily.     donepezil (ARICEPT) 10 MG tablet TAKE 1 TABLET BY MOUTH ONCE EVERY EVENING     lidocaine-prilocaine (EMLA) cream Apply to affected area once 30 g 3   meloxicam (MOBIC) 7.5 MG tablet Take 1 tablet by mouth daily.     memantine (NAMENDA) 5 MG tablet Take 5 mg by mouth 2 (two) times daily.     omeprazole (PRILOSEC) 40 MG capsule 40 mg daily.     ondansetron (ZOFRAN) 8 MG tablet Take 1 tablet (8 mg total) by mouth every 8 (eight) hours as needed for nausea or vomiting. Start on the third day after cisplatin. 60 tablet 2   prochlorperazine (COMPAZINE) 10 MG tablet Take 1 tablet (10 mg total) by mouth every 6 (six) hours as needed (Nausea or vomiting). 60 tablet 2   raloxifene (EVISTA) 60 MG tablet TAKE 1 TABLET BY MOUTH ONCE A DAY 90 tablet 1   rosuvastatin (CRESTOR) 5 MG tablet Take 5 mg by mouth daily.     No current facility-administered medications for this visit.   Facility-Administered Medications Ordered in Other Visits  Medication Dose Route Frequency Provider Last Rate Last Admin   CISplatin (PLATINOL) 100 mg in sodium chloride 0.9 % 500 mL chemo infusion  70 mg/m2 (Treatment  Plan Recorded) Intravenous Once Lloyd Huger, MD       fosaprepitant (EMEND) 150 mg in sodium chloride 0.9 % 145 mL IVPB  150 mg Intravenous Once Lloyd Huger, MD 450 mL/hr at 12/11/22 1213 150 mg at 12/11/22 1213   gemcitabine (GEMZAR) 1,399 mg in sodium chloride 0.9 % 250 mL chemo infusion  1,399 mg Intravenous Once Lloyd Huger, MD       heparin lock flush 100 unit/mL  500 Units Intracatheter Once PRN Lloyd Huger, MD       sodium chloride flush (NS) 0.9 % injection 10 mL  10 mL Intracatheter PRN Lloyd Huger, MD  OBJECTIVE: Vitals:   12/11/22 0837  BP: 139/75  Pulse: 77  Resp: 16  Temp: 98.4 F (36.9 C)  SpO2: 100%     Body mass index is 25.1 kg/m.    ECOG FS:0 - Asymptomatic  General: Well-developed, well-nourished, no acute distress. Eyes: Pink conjunctiva, anicteric sclera. HEENT: Normocephalic, moist mucous membranes. Lungs: No audible wheezing or coughing. Heart: Regular rate and rhythm. Abdomen: Soft, nontender, no obvious distention. Musculoskeletal: No edema, cyanosis, or clubbing. Neuro: Alert, answering all questions appropriately. Cranial nerves grossly intact. Skin: No rashes or petechiae noted. Psych: Normal affect.  LAB RESULTS:  Lab Results  Component Value Date   NA 137 12/11/2022   K 4.1 12/11/2022   CL 104 12/11/2022   CO2 23 12/11/2022   GLUCOSE 114 (H) 12/11/2022   BUN 23 12/11/2022   CREATININE 1.45 (H) 12/11/2022   CALCIUM 9.5 12/11/2022   PROT 6.9 12/11/2022   ALBUMIN 4.3 12/11/2022   AST 20 12/11/2022   ALT 13 12/11/2022   ALKPHOS 47 12/11/2022   BILITOT 0.8 12/11/2022   GFRNONAA 37 (L) 12/11/2022    Lab Results  Component Value Date   WBC 6.5 12/11/2022   NEUTROABS 4.4 12/11/2022   HGB 11.7 (L) 12/11/2022   HCT 36.7 12/11/2022   MCV 85.0 12/11/2022   PLT 187 12/11/2022     STUDIES: IR IMAGING GUIDED PORT INSERTION  Result Date: 12/06/2022 INDICATION: port placement for chemotherapy  EXAM: Chest port placement using ultrasound and fluoroscopic guidance MEDICATIONS: Documented in the EMR ANESTHESIA/SEDATION: Moderate (conscious) sedation was employed during this procedure. A total of Versed 1.5 mg and Fentanyl 75 mcg was administered intravenously. Moderate Sedation Time: 20 minutes. The patient's level of consciousness and vital signs were monitored continuously by radiology nursing throughout the procedure under my direct supervision. FLUOROSCOPY TIME:  Fluoroscopy Time: 1.1 minutes (1 mGy) COMPLICATIONS: None immediate. PROCEDURE: Informed written consent was obtained from the patient after a thorough discussion of the procedural risks, benefits and alternatives. All questions were addressed. Maximal Sterile Barrier Technique was utilized including caps, mask, sterile gowns, sterile gloves, sterile drape, hand hygiene and skin antiseptic. A timeout was performed prior to the initiation of the procedure. The patient was placed supine on the exam table. The right neck and chest was prepped and draped in the standard sterile fashion. A preliminary ultrasound of the right neck was performed and demonstrates a patent right internal jugular vein. A permanent ultrasound image was stored in the electronic medical record. The overlying skin was anesthetized with 1% Lidocaine. Using ultrasound guidance, access was obtained into the right internal jugular vein using a 21 gauge micropuncture set. A wire was advanced into the SVC, a short incision was made at the puncture site, and serial dilatation performed. Next, in an ipsilateral infraclavicular location, an incision was made at the site of the subcutaneous reservoir. Blunt dissection was used to open a pocket to contain the reservoir. A subcutaneous tunnel was then created from the port site to the puncture site. A(n) 8 Fr single lumen catheter was advanced through the tunnel. The catheter was attached to the port and this was placed in the  subcutaneous pocket. Under fluoroscopic guidance, a peel away sheath was placed, and the catheter was trimmed to the appropriate length and was advanced into the central veins. The catheter length is 21 cm. The tip of the catheter lies near the superior cavoatrial junction. The port flushes and aspirates appropriately. The port was flushed and locked with heparinized  saline. The port pocket was closed in 2 layers using 3-0 and 4-0 Vicryl/absorbable suture. Dermabond was also applied to both incisions. The patient tolerated the procedure well and was transferred to recovery in stable condition. IMPRESSION: Successful placement of a right-sided chest port via the right internal jugular vein. The port is ready for immediate use. Electronically Signed   By: Albin Felling M.D.   On: 12/06/2022 15:47   NM PET Image Initial (PI) Skull Base To Thigh  Result Date: 11/14/2022 CLINICAL DATA:  Initial treatment strategy for uroepithelial carcinoma of the left kidney. EXAM: NUCLEAR MEDICINE PET SKULL BASE TO THIGH TECHNIQUE: 6.12 mCi F-18 FDG was injected intravenously. Full-ring PET imaging was performed from the skull base to thigh after the radiotracer. CT data was obtained and used for attenuation correction and anatomic localization. Fasting blood glucose: 86 mg/dl COMPARISON:  CT scan 09/25/2022 FINDINGS: Mediastinal blood pool activity: SUV max 2.04 Liver activity: SUV max NA NECK: No hypermetabolic lymph nodes in the neck. Incidental CT findings: None. CHEST: No hypermetabolic mediastinal or hilar nodes. No suspicious pulmonary nodules on the CT scan. No hypermetabolic breast masses, supraclavicular or axillary adenopathy. Incidental CT findings: Atherosclerotic calcifications involving the aorta and coronary arteries. Significant pectus deformity with flattening of the right ventricle and mild cardiac enlargement. ABDOMEN/PELVIS: No hypermetabolism associated with the left renal lesion. No enlarged or  hypermetabolic mesenteric or retroperitoneal lymph nodes. No findings for metastatic disease involving the liver or adrenal glands. No obvious bladder abnormality. Incidental CT findings: Stable scattered vascular calcifications. Stable large calcification in the pelvis. SKELETON: No focal hypermetabolic activity to suggest skeletal metastasis. Incidental CT findings: Significant scoliosis and associated degenerative lumbar spondylosis. IMPRESSION: The left upper pole collecting system mass does not show any hypermetabolism and there is no evidence of metastatic disease involving the chest, abdomen/pelvis or bony structures. Electronically Signed   By: Marijo Sanes M.D.   On: 11/14/2022 08:47    ASSESSMENT: Urothelial carcinoma of the kidney.  PLAN:    Urothelial carcinoma of the kidney: Confirmed by biopsy.  PET scan results on November 14, 2022 reviewed independently with no significant hypermetabolism of known malignancy.  Imaging does not reveal any metastatic disease.  After consultation with Dr. Erlene Quan, it is recommended that patient pursue neoadjuvant chemotherapy followed by surgical resection.  Patient will receive cisplatin and gemcitabine on day 1 with gemcitabine on day 8 and a 21-day cycle.  Plan to give 4 cycles and then refer back to urology for surgical resection.  Proceed with cycle 1, day 1 of treatment today.  Return to clinic in 1 week for further evaluation and consideration of cycle 1, day 8 which is gemcitabine only.   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 Renal insufficiency: Mild.  Monitor closely while receiving cisplatin. Anemia: Mild, monitor.   Patient expressed understanding and was in agreement with this plan. She also understands that She can call clinic at any time with any questions, concerns, or complaints.    Cancer Staging  Urothelial carcinoma of kidney [facility]) Staging form: Kidney, AJCC 8th Edition - Clinical stage from 12/01/2022: Stage  Unknown (cTX, cN0, cM0) - Signed by Lloyd Huger, MD on 12/01/2022 Stage prefix: Initial diagnosis Histologic grade (G): G3 Histologic grading system: 4 grade system   Lloyd Huger, MD   12/11/2022 12:25 PM

## 2022-12-11 NOTE — Progress Notes (Signed)
OK to run post hydration fluids with cisplatin per Dr Grayland Ormond

## 2022-12-11 NOTE — Patient Instructions (Signed)
[facility]  Discharge Instructions: Thank you for choosing [facility] to provide your oncology and hematology care.  If you have a lab appointment with the [facility], please go directly to the [facility] and check in at the registration area.  Wear comfortable clothing and clothing appropriate for easy access to any Portacath or PICC line.   We strive to give you quality time with your provider. You may need to reschedule your appointment if you arrive late (15 or more minutes).  Arriving late affects you and other patients whose appointments are after yours.  Also, if you miss three or more appointments without notifying the office, you may be dismissed from the clinic at the provider's discretion.      For prescription refill requests, have your pharmacy contact our office and allow 72 hours for refills to be completed.    Today you received the following chemotherapy and/or immunotherapy agents gemzar/cisplatin    To help prevent nausea and vomiting after your treatment, we encourage you to take your nausea medication as directed.  BELOW ARE SYMPTOMS THAT SHOULD BE REPORTED IMMEDIATELY: *FEVER GREATER THAN 100.4 F (38 C) OR HIGHER *CHILLS OR SWEATING *NAUSEA AND VOMITING THAT IS NOT CONTROLLED WITH YOUR NAUSEA MEDICATION *UNUSUAL SHORTNESS OF BREATH *UNUSUAL BRUISING OR BLEEDING *URINARY PROBLEMS (pain or burning when urinating, or frequent urination) *BOWEL PROBLEMS (unusual diarrhea, constipation, pain near the anus) TENDERNESS IN MOUTH AND THROAT WITH OR WITHOUT PRESENCE OF ULCERS (sore throat, sores in mouth, or a toothache) UNUSUAL RASH, SWELLING OR PAIN  UNUSUAL VAGINAL DISCHARGE OR ITCHING   Items with * indicate a potential emergency and should be followed up as soon as possible or go to the Emergency Department if any problems should occur.  Please show the CHEMOTHERAPY ALERT CARD or IMMUNOTHERAPY ALERT CARD at  check-in to the Emergency Department and triage nurse.  Should you have questions after your visit or need to cancel or reschedule your appointment, please contact [facility]  [phone] and follow the prompts.  Office hours are 8:00 a.m. to 4:30 p.m. Monday - Friday. Please note that voicemails left after 4:00 p.m. may not be returned until the following business day.  We are closed weekends and major holidays. You have access to a nurse at all times for urgent questions. Please call the main number to the clinic [phone] and follow the prompts.  For any non-urgent questions, you may also contact your provider using MyChart. We now offer e-Visits for anyone 75 and older to request care online for non-urgent symptoms. For details visit [email or web address].   Also download the MyChart app! Go to the app store, search "MyChart", open the app, select [facility], and log in with your MyChart username and password.  Gemcitabine Injection What is this medication? GEMCITABINE (jem SYE ta been) treats some types of cancer. It works by slowing down the growth of cancer cells. This medicine may be used for other purposes; ask your health care provider or pharmacist if you have questions. COMMON BRAND NAME(S): Gemzar, Infugem What should I tell my care team before I take this medication? They need to know if you have any of these conditions: Blood disorders Infection Kidney disease Liver disease Lung or breathing disease, such as asthma or COPD Recent or ongoing radiation therapy An unusual or allergic reaction to gemcitabine, other medications, foods, dyes, or preservatives If you or your partner are pregnant or trying  to get pregnant Breast-feeding How should I use this medication? This medication is injected into a vein. It is given by your care team in a hospital or clinic setting. Talk to your care team about the use of this medication in children.  Special care may be needed. Overdosage: If you think you have taken too much of this medicine contact a poison control center or emergency room at once. NOTE: This medicine is only for you. Do not share this medicine with others. What if I miss a dose? Keep appointments for follow-up doses. It is important not to miss your dose. Call your care team if you are unable to keep an appointment. What may interact with this medication? Interactions have not been studied. This list may not describe all possible interactions. Give your health care provider a list of all the medicines, herbs, non-prescription drugs, or dietary supplements you use. Also tell them if you smoke, drink alcohol, or use illegal drugs. Some items may interact with your medicine. What should I watch for while using this medication? Your condition will be monitored carefully while you are receiving this medication. This medication may make you feel generally unwell. This is not uncommon, as chemotherapy can affect healthy cells as well as cancer cells. Report any side effects. Continue your course of treatment even though you feel ill unless your care team tells you to stop. In some cases, you may be given additional medications to help with side effects. Follow all directions for their use. This medication may increase your risk of getting an infection. Call your care team for advice if you get a fever, chills, sore throat, or other symptoms of a cold or flu. Do not treat yourself. Try to avoid being around people who are sick. This medication may increase your risk to bruise or bleed. Call your care team if you notice any unusual bleeding. Be careful brushing or flossing your teeth or using a toothpick because you may get an infection or bleed more easily. If you have any dental work done, tell your dentist you are receiving this medication. Avoid taking medications that contain aspirin, acetaminophen, ibuprofen, naproxen, or  ketoprofen unless instructed by your care team. These medications may hide a fever. Talk to your care team if you or your partner wish to become pregnant or think you might be pregnant. This medication can cause serious birth defects if taken during pregnancy and for 6 months after the last dose. A negative pregnancy test is required before starting this medication. A reliable form of contraception is recommended while taking this medication and for 6 months after the last dose. Talk to your care team about effective forms of contraception. Do not father a child while taking this medication and for 3 months after the last dose. Use a condom while having sex during this time period. Do not breastfeed while taking this medication and for at least 1 week after the last dose. This medication may cause infertility. Talk to your care team if you are concerned about your fertility. What side effects may I notice from receiving this medication? Side effects that you should report to your care team as soon as possible: Allergic reactions--skin rash, itching, hives, swelling of the face, lips, tongue, or throat Capillary leak syndrome--stomach or muscle pain, unusual weakness or fatigue, feeling faint or lightheaded, decrease in the amount of urine, swelling of the ankles, hands, or feet, trouble breathing Infection--fever, chills, cough, sore throat, wounds that don't heal, pain or  trouble when passing urine, general feeling of discomfort or being unwell Liver injury--right upper belly pain, loss of appetite, nausea, light-colored stool, dark yellow or brown urine, yellowing skin or eyes, unusual weakness or fatigue Low red blood cell level--unusual weakness or fatigue, dizziness, headache, trouble breathing Lung injury--shortness of breath or trouble breathing, cough, spitting up blood, chest pain, fever Stomach pain, bloody diarrhea, pale skin, unusual weakness or fatigue, decrease in the amount of urine, which  may be signs of hemolytic uremic syndrome Sudden and severe headache, confusion, change in vision, seizures, which may be signs of posterior reversible encephalopathy syndrome (PRES) Unusual bruising or bleeding Side effects that usually do not require medical attention (report to your care team if they continue or are bothersome): Diarrhea Drowsiness Hair loss Nausea Pain, redness, or swelling with sores inside the mouth or throat Vomiting This list may not describe all possible side effects. Call your doctor for medical advice about side effects. You may report side effects to FDA at 1-800-FDA-1088. Where should I keep my medication? This medication is given in a hospital or clinic. It will not be stored at home. NOTE: This sheet is a summary. It may not cover all possible information. If you have questions about this medicine, talk to your doctor, pharmacist, or health care provider.  2023 Elsevier/Gold Standard (2022-02-06 00:00:00)  Cisplatin Injection What is this medication? CISPLATIN (SIS pla tin) treats some types of cancer. It works by slowing down the growth of cancer cells. This medicine may be used for other purposes; ask your health care provider or pharmacist if you have questions. COMMON BRAND NAME(S): Platinol, Platinol -AQ What should I tell my care team before I take this medication? They need to know if you have any of these conditions: Eye disease, vision problems Hearing problems Kidney disease Low blood counts, such as low white cells, platelets, or red blood cells Tingling of the fingers or toes, or other nerve disorder An unusual or allergic reaction to cisplatin, carboplatin, oxaliplatin, other medications, foods, dyes, or preservatives If you or your partner are pregnant or trying to get pregnant Breast-feeding How should I use this medication? This medication is injected into a vein. It is given by your care team in a hospital or clinic setting. Talk to  your care team about the use of this medication in children. Special care may be needed. Overdosage: If you think you have taken too much of this medicine contact a poison control center or emergency room at once. NOTE: This medicine is only for you. Do not share this medicine with others. What if I miss a dose? Keep appointments for follow-up doses. It is important not to miss your dose. Call your care team if you are unable to keep an appointment. What may interact with this medication? Do not take this medication with any of the following: Live virus vaccines This medication may also interact with the following: Certain antibiotics, such as amikacin, gentamicin, neomycin, polymyxin B, streptomycin, tobramycin, vancomycin Foscarnet This list may not describe all possible interactions. Give your health care provider a list of all the medicines, herbs, non-prescription drugs, or dietary supplements you use. Also tell them if you smoke, drink alcohol, or use illegal drugs. Some items may interact with your medicine. What should I watch for while using this medication? Your condition will be monitored carefully while you are receiving this medication. You may need blood work done while taking this medication. This medication may make you feel generally unwell.  This is not uncommon, as chemotherapy can affect healthy cells as well as cancer cells. Report any side effects. Continue your course of treatment even though you feel ill unless your care team tells you to stop. This medication may increase your risk of getting an infection. Call your care team for advice if you get a fever, chills, sore throat, or other symptoms of a cold or flu. Do not treat yourself. Try to avoid being around people who are sick. Avoid taking medications that contain aspirin, acetaminophen, ibuprofen, naproxen, or ketoprofen unless instructed by your care team. These medications may hide a fever. This medication may increase  your risk to bruise or bleed. Call your care team if you notice any unusual bleeding. Be careful brushing or flossing your teeth or using a toothpick because you may get an infection or bleed more easily. If you have any dental work done, tell your dentist you are receiving this medication. Drink fluids as directed while you are taking this medication. This will help protect your kidneys. Call your care team if you get diarrhea. Do not treat yourself. Talk to your care team if you or your partner wish to become pregnant or think you might be pregnant. This medication can cause serious birth defects if taken during pregnancy and for 14 months after the last dose. A negative pregnancy test is required before starting this medication. A reliable form of contraception is recommended while taking this medication and for 14 months after the last dose. Talk to your care team about effective forms of contraception. Do not father a child while taking this medication and for 11 months after the last dose. Use a condom during sex during this time period. Do not breast-feed while taking this medication. This medication may cause infertility. Talk to your care team if you are concerned about your fertility. What side effects may I notice from receiving this medication? Side effects that you should report to your care team as soon as possible: Allergic reactions--skin rash, itching, hives, swelling of the face, lips, tongue, or throat Eye pain, change in vision, vision loss Hearing loss, ringing in ears Infection--fever, chills, cough, sore throat, wounds that don't heal, pain or trouble when passing urine, general feeling of discomfort or being unwell Kidney injury--decrease in the amount of urine, swelling of the ankles, hands, or feet Low red blood cell level--unusual weakness or fatigue, dizziness, headache, trouble breathing Painful swelling, warmth, or redness of the skin, blisters or sores at the infusion  site Pain, tingling, or numbness in the hands or feet Unusual bruising or bleeding Side effects that usually do not require medical attention (report to your care team if they continue or are bothersome): Hair loss Nausea Vomiting This list may not describe all possible side effects. Call your doctor for medical advice about side effects. You may report side effects to FDA at 1-800-FDA-1088. Where should I keep my medication? This medication is given in a hospital or clinic. It will not be stored at home. NOTE: This sheet is a summary. It may not cover all possible information. If you have questions about this medicine, talk to your doctor, pharmacist, or health care provider.  2023 Elsevier/Gold Standard (2022-01-26 00:00:00)

## 2022-12-12 ENCOUNTER — Telehealth: Payer: Self-pay | Admitting: *Deleted

## 2022-12-12 NOTE — Telephone Encounter (Signed)
Call placed to patients daughter [name] to follow up after patient received first chemotherapy treatment yesterday. Patients daughter reports patient is doing well overall, denies any symptoms other than fatigue. No nausea, no vomiting. Nurse reviewed use of nausea medications if needed. Nurse encouraged patient/family to call with any questions or concerns prior to next visit.

## 2022-12-18 ENCOUNTER — Encounter: Payer: Self-pay | Admitting: Oncology

## 2022-12-18 ENCOUNTER — Inpatient Hospital Stay: Payer: PPO

## 2022-12-18 ENCOUNTER — Inpatient Hospital Stay (HOSPITAL_BASED_OUTPATIENT_CLINIC_OR_DEPARTMENT_OTHER): Payer: PPO | Admitting: Oncology

## 2022-12-18 ENCOUNTER — Inpatient Hospital Stay: Payer: PPO | Attending: Oncology

## 2022-12-18 VITALS — Temp 98.9°F

## 2022-12-18 DIAGNOSIS — C642 Malignant neoplasm of left kidney, except renal pelvis: Secondary | ICD-10-CM

## 2022-12-18 DIAGNOSIS — Z5111 Encounter for antineoplastic chemotherapy: Secondary | ICD-10-CM | POA: Diagnosis not present

## 2022-12-18 LAB — CBC WITH DIFFERENTIAL (CANCER CENTER ONLY)
Abs Immature Granulocytes: 0.02 10*3/uL (ref 0.00–0.07)
Basophils Absolute: 0 10*3/uL (ref 0.0–0.1)
Basophils Relative: 1 %
Eosinophils Absolute: 0.1 10*3/uL (ref 0.0–0.5)
Eosinophils Relative: 2 %
HCT: 36 % (ref 36.0–46.0)
Hemoglobin: 11.8 g/dL — ABNORMAL LOW (ref 12.0–15.0)
Immature Granulocytes: 0 %
Lymphocytes Relative: 20 %
Lymphs Abs: 1.1 10*3/uL (ref 0.7–4.0)
MCH: 27.3 pg (ref 26.0–34.0)
MCHC: 32.8 g/dL (ref 30.0–36.0)
MCV: 83.3 fL (ref 80.0–100.0)
Monocytes Absolute: 0.2 10*3/uL (ref 0.1–1.0)
Monocytes Relative: 4 %
Neutro Abs: 3.9 10*3/uL (ref 1.7–7.7)
Neutrophils Relative %: 73 %
Platelet Count: 118 10*3/uL — ABNORMAL LOW (ref 150–400)
RBC: 4.32 MIL/uL (ref 3.87–5.11)
RDW: 12.4 % (ref 11.5–15.5)
WBC Count: 5.3 10*3/uL (ref 4.0–10.5)
nRBC: 0 % (ref 0.0–0.2)

## 2022-12-18 LAB — CMP (CANCER CENTER ONLY)
ALT: 25 U/L (ref 0–44)
AST: 27 U/L (ref 15–41)
Albumin: 4 g/dL (ref 3.5–5.0)
Alkaline Phosphatase: 46 U/L (ref 38–126)
Anion gap: 10 (ref 5–15)
BUN: 23 mg/dL (ref 8–23)
CO2: 25 mmol/L (ref 22–32)
Calcium: 9.2 mg/dL (ref 8.9–10.3)
Chloride: 98 mmol/L (ref 98–111)
Creatinine: 1.53 mg/dL — ABNORMAL HIGH (ref 0.44–1.00)
GFR, Estimated: 35 mL/min — ABNORMAL LOW (ref >60)
Glucose, Bld: 122 mg/dL — ABNORMAL HIGH (ref 70–99)
Potassium: 3.6 mmol/L (ref 3.5–5.1)
Sodium: 133 mmol/L — ABNORMAL LOW (ref 135–145)
Total Bilirubin: 0.4 mg/dL (ref 0.3–1.2)
Total Protein: 6.6 g/dL (ref 6.5–8.1)

## 2022-12-18 MED ORDER — SODIUM CHLORIDE 0.9 % IV SOLN
1000.0000 mg/m2 | Freq: Once | INTRAVENOUS | Status: AC
  Administered 2022-12-18: 1407 mg via INTRAVENOUS
  Filled 2022-12-18: qty 26.28

## 2022-12-18 MED ORDER — SODIUM CHLORIDE 0.9 % IV SOLN
Freq: Once | INTRAVENOUS | Status: AC
  Filled 2022-12-18: qty 250

## 2022-12-18 MED ORDER — HEPARIN SOD (PORK) LOCK FLUSH 100 UNIT/ML IV SOLN
500.0000 [IU] | Freq: Once | INTRAVENOUS | Status: AC | PRN
  Administered 2022-12-18: 500 [IU]
  Filled 2022-12-18: qty 5

## 2022-12-18 MED ORDER — PROCHLORPERAZINE MALEATE 10 MG PO TABS
10.0000 mg | ORAL_TABLET | Freq: Once | ORAL | Status: AC
  Administered 2022-12-18: 10 mg via ORAL

## 2022-12-18 NOTE — Patient Instructions (Signed)
[facility]  Discharge Instructions: Thank you for choosing [facility] to provide your oncology and hematology care.  If you have a lab appointment with the Cancer Center, please go directly to the Cancer Center and check in at the registration area.  Wear comfortable clothing and clothing appropriate for easy access to any Portacath or PICC line.   We strive to give you quality time with your provider. You may need to reschedule your appointment if you arrive late (15 or more minutes).  Arriving late affects you and other patients whose appointments are after yours.  Also, if you miss three or more appointments without notifying the office, you may be dismissed from the clinic at the provider's discretion.      For prescription refill requests, have your pharmacy contact our office and allow 72 hours for refills to be completed.     To help prevent nausea and vomiting after your treatment, we encourage you to take your nausea medication as directed.  BELOW ARE SYMPTOMS THAT SHOULD BE REPORTED IMMEDIATELY: *FEVER GREATER THAN 100.4 F (38 C) OR HIGHER *CHILLS OR SWEATING *NAUSEA AND VOMITING THAT IS NOT CONTROLLED WITH YOUR NAUSEA MEDICATION *UNUSUAL SHORTNESS OF BREATH *UNUSUAL BRUISING OR BLEEDING *URINARY PROBLEMS (pain or burning when urinating, or frequent urination) *BOWEL PROBLEMS (unusual diarrhea, constipation, pain near the anus) TENDERNESS IN MOUTH AND THROAT WITH OR WITHOUT PRESENCE OF ULCERS (sore throat, sores in mouth, or a toothache) UNUSUAL RASH, SWELLING OR PAIN  UNUSUAL VAGINAL DISCHARGE OR ITCHING   Items with * indicate a potential emergency and should be followed up as soon as possible or go to the Emergency Department if any problems should occur.  Please show the CHEMOTHERAPY ALERT CARD or IMMUNOTHERAPY ALERT CARD at check-in to the Emergency Department and triage nurse.  Should you have questions after your visit  or need to cancel or reschedule your appointment, please contact [facility]  [phone] and follow the prompts.  Office hours are 8:00 a.m. to 4:30 p.m. Monday - Friday. Please note that voicemails left after 4:00 p.m. may not be returned until the following business day.  We are closed weekends and major holidays. You have access to a nurse at all times for urgent questions. Please call the main number to the clinic [phone] and follow the prompts.  For any non-urgent questions, you may also contact your provider using MyChart. We now offer e-Visits for anyone 18 and older to request care online for non-urgent symptoms. For details visit [email or web address].   Also download the MyChart app! Go to the app store, search "MyChart", open the app, select , and log in with your MyChart username and password.    

## 2022-12-18 NOTE — Progress Notes (Signed)
Patient has been having diarrhea, fatigue, "winded really easy", no appetite, blood in urine again since Thursday with pain and burning as well, right hip pain 6/10, family member asking if they should wait until chemo is done before they see the neurologist.

## 2022-12-18 NOTE — Progress Notes (Signed)
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  INTERVAL HISTORY: Patient returns to clinic today for further evaluation and consideration of cycle 1, day 8 of cisplatin and gemcitabine.  Gemcitabine only today.  She tolerated her first treatment relatively well.  Her main complaint is of significant fatigue, but admits that she remains active.  She also has a poor appetite, but attributes this to the taste of food.  She has no neurologic complaints.  She denies any recent fevers or illnesses.  She has a good appetite and denies weight loss.  She has no chest pain, shortness of breath, cough, or hemoptysis.  She denies any nausea, vomiting, constipation, or diarrhea.  She has no other urinary complaints.  Patient offers no further specific complaints today.  REVIEW OF SYSTEMS:   Review of Systems  Constitutional:  Positive for malaise/fatigue. Negative for fever and weight loss.  Respiratory: Negative.  Negative for cough and shortness of breath.   Cardiovascular: Negative.  Negative for chest pain and leg swelling.  Gastrointestinal: Negative.  Negative for abdominal pain.  Genitourinary:  Positive for hematuria.  Musculoskeletal: Negative.  Negative for back pain.  Skin: Negative.  Negative for rash.  Neurological:  Positive for weakness. Negative for dizziness, focal weakness and headaches.  Psychiatric/Behavioral: Negative.  The patient is not nervous/anxious.     As per HPI. Otherwise, a complete review of systems is negative.  PAST MEDICAL HISTORY: Past Medical History:  Diagnosis Date   Back pain    Basal cell carcinoma 08/04/2020   0.5cm above the left mid brow, EDC 09/15/20   Cancer ([place])    Melanoma   Curvature of spine    Dysplastic nevus  04/30/2013   left post shoulder, left parasternal   Dysplastic nevus 05/04/2014   right prox ant deltoid, left lat buttock   Dysplastic nevus 03/26/2019   right lat neck adjacent to the inf top of MM in situ site scar   Dysplastic nevus 10/29/2019   upper back left paraspinal at level of mid scapula   Dysplastic nevus 03/31/2020, 05/12/20 shave removal   Left mid pretibial. Moderate to severe atypia, deep margin involved.   Dysplastic nevus 06/29/2021   L ant deltoid, moderat to severe atypia, excised 08/15/21   Endometriosis    Endometriosis    Fibrocystic disease of breast    Fibroid    GERD (gastroesophageal reflux disease)    History of kidney stones    HLD (hyperlipidemia)    Hyperlipemia    Hypertension    Kidney stone    Melanoma in situ ([place]) 07/03/2018   right lat neck inf to right angle of mandible, excised: 07/29/2018, margins free   Nephrolithiasis    Osteopenia    Osteoporosis    Pelvic pain in female    Raynaud disease    Thyromegaly     PAST SURGICAL HISTORY: Past Surgical History:  Procedure Laterality Date   ABDOMINAL HYSTERECTOMY     BILATERAL OOPHORECTOMY  1999   CESAREAN SECTION     COLONOSCOPY WITH PROPOFOL N/A 05/09/2015   Procedure: COLONOSCOPY WITH PROPOFOL;  Surgeon: Josefine Class, MD;  Location: [facility] ENDOSCOPY;  Service: Endoscopy;  Laterality: N/A;   CYSTOSCOPY WITH RETROGRADE PYELOGRAM, URETEROSCOPY AND STENT PLACEMENT Left 10/09/2022   Procedure: CYSTOSCOPY WITH RETROGRADE  PYELOGRAM, URETEROSCOPY AND STENT PLACEMENT;  Surgeon: Abbie Sons, MD;  Location: [facility] ORS;  Service: Urology;  Laterality: Left;   IR IMAGING GUIDED PORT INSERTION  12/06/2022   MELANOMA EXCISION Right 07/29/2018   lateral neck inf. to right angle of mandible   OOPHORECTOMY     URETERAL BIOPSY Left 10/09/2022   Procedure: URETERAL BIOPSY;  Surgeon: Abbie Sons, MD;  Location: [facility] ORS;  Service: Urology;  Laterality: Left;    FAMILY HISTORY: Family  History  Problem Relation Age of Onset   Emphysema Father    Cancer Neg Hx    Heart disease Neg Hx    Diabetes Neg Hx    Breast cancer Neg Hx     ADVANCED DIRECTIVES (Y/N):  N  HEALTH MAINTENANCE: Social History   Tobacco Use   Smoking status: Never    Passive exposure: Never   Smokeless tobacco: Never  Vaping Use   Vaping Use: Never used  Substance Use Topics   Alcohol use: No   Drug use: No     Colonoscopy:  PAP:  Bone density:  Lipid panel:  Allergies  Allergen Reactions   Codeine Nausea And Vomiting   Neosporin Wound Cleanser [Benzalkonium Chloride] Other (See Comments)    Skin blisters   Penicillins     Current Outpatient Medications  Medication Sig Dispense Refill   acetaminophen (TYLENOL) 325 MG tablet Take 650 mg by mouth every 6 (six) hours as needed.     calcium-vitamin D (OSCAL WITH D) 500-200 MG-UNIT tablet 1 tablet daily with breakfast.     Cholecalciferol 25 MCG (1000 UT) tablet Take 1,000 Units by mouth daily.     cyanocobalamin 1000 MCG tablet Take 1,000 mcg by mouth daily.     donepezil (ARICEPT) 10 MG tablet TAKE 1 TABLET BY MOUTH ONCE EVERY EVENING     lidocaine-prilocaine (EMLA) cream Apply to affected area once 30 g 3   meloxicam (MOBIC) 7.5 MG tablet Take 1 tablet by mouth daily.     memantine (NAMENDA) 5 MG tablet Take 5 mg by mouth 2 (two) times daily.     omeprazole (PRILOSEC) 40 MG capsule 40 mg daily.     ondansetron (ZOFRAN) 8 MG tablet Take 1 tablet (8 mg total) by mouth every 8 (eight) hours as needed for nausea or vomiting. Start on the third day after cisplatin. 60 tablet 2   prochlorperazine (COMPAZINE) 10 MG tablet Take 1 tablet (10 mg total) by mouth every 6 (six) hours as needed (Nausea or vomiting). 60 tablet 2   raloxifene (EVISTA) 60 MG tablet TAKE 1 TABLET BY MOUTH ONCE A DAY 90 tablet 1   rosuvastatin (CRESTOR) 5 MG tablet Take 5 mg by mouth daily.     No current facility-administered medications for this visit.    Facility-Administered Medications Ordered in Other Visits  Medication Dose Route Frequency Provider Last Rate Last Admin   gemcitabine (GEMZAR) 1,407 mg in sodium chloride 0.9 % 250 mL chemo infusion  1,000 mg/m2 (Treatment Plan Recorded) Intravenous Once Lloyd Huger, MD 574 mL/hr at 12/18/22 1147 1,407 mg at 12/18/22 1147   heparin lock flush 100 unit/mL  500 Units Intracatheter Once PRN Lloyd Huger, MD        OBJECTIVE: Vitals:   12/18/22 0941  BP: 123/64  Pulse: 75  Resp: 18  SpO2: 100%     Body mass index is 24.4 kg/m.    ECOG FS:0 - Asymptomatic  General: Well-developed, well-nourished, no acute  distress. Eyes: Pink conjunctiva, anicteric sclera. HEENT: Normocephalic, moist mucous membranes. Lungs: No audible wheezing or coughing. Heart: Regular rate and rhythm. Abdomen: Soft, nontender, no obvious distention. Musculoskeletal: No edema, cyanosis, or clubbing. Neuro: Alert, answering all questions appropriately. Cranial nerves grossly intact. Skin: No rashes or petechiae noted. Psych: Normal affect.  LAB RESULTS:  Lab Results  Component Value Date   NA 133 (L) 12/18/2022   K 3.6 12/18/2022   CL 98 12/18/2022   CO2 25 12/18/2022   GLUCOSE 122 (H) 12/18/2022   BUN 23 12/18/2022   CREATININE 1.53 (H) 12/18/2022   CALCIUM 9.2 12/18/2022   PROT 6.6 12/18/2022   ALBUMIN 4.0 12/18/2022   AST 27 12/18/2022   ALT 25 12/18/2022   ALKPHOS 46 12/18/2022   BILITOT 0.4 12/18/2022   GFRNONAA 35 (L) 12/18/2022    Lab Results  Component Value Date   WBC 5.3 12/18/2022   NEUTROABS 3.9 12/18/2022   HGB 11.8 (L) 12/18/2022   HCT 36.0 12/18/2022   MCV 83.3 12/18/2022   PLT 118 (L) 12/18/2022     STUDIES: IR IMAGING GUIDED PORT INSERTION  Result Date: 12/06/2022 INDICATION: port placement for chemotherapy EXAM: Chest port placement using ultrasound and fluoroscopic guidance MEDICATIONS: Documented in the EMR ANESTHESIA/SEDATION: Moderate (conscious)  sedation was employed during this procedure. A total of Versed 1.5 mg and Fentanyl 75 mcg was administered intravenously. Moderate Sedation Time: 20 minutes. The patient's level of consciousness and vital signs were monitored continuously by radiology nursing throughout the procedure under my direct supervision. FLUOROSCOPY TIME:  Fluoroscopy Time: 1.1 minutes (1 mGy) COMPLICATIONS: None immediate. PROCEDURE: Informed written consent was obtained from the patient after a thorough discussion of the procedural risks, benefits and alternatives. All questions were addressed. Maximal Sterile Barrier Technique was utilized including caps, mask, sterile gowns, sterile gloves, sterile drape, hand hygiene and skin antiseptic. A timeout was performed prior to the initiation of the procedure. The patient was placed supine on the exam table. The right neck and chest was prepped and draped in the standard sterile fashion. A preliminary ultrasound of the right neck was performed and demonstrates a patent right internal jugular vein. A permanent ultrasound image was stored in the electronic medical record. The overlying skin was anesthetized with 1% Lidocaine. Using ultrasound guidance, access was obtained into the right internal jugular vein using a 21 gauge micropuncture set. A wire was advanced into the SVC, a short incision was made at the puncture site, and serial dilatation performed. Next, in an ipsilateral infraclavicular location, an incision was made at the site of the subcutaneous reservoir. Blunt dissection was used to open a pocket to contain the reservoir. A subcutaneous tunnel was then created from the port site to the puncture site. A(n) 8 Fr single lumen catheter was advanced through the tunnel. The catheter was attached to the port and this was placed in the subcutaneous pocket. Under fluoroscopic guidance, a peel away sheath was placed, and the catheter was trimmed to the appropriate length and was advanced into  the central veins. The catheter length is 21 cm. The tip of the catheter lies near the superior cavoatrial junction. The port flushes and aspirates appropriately. The port was flushed and locked with heparinized saline. The port pocket was closed in 2 layers using 3-0 and 4-0 Vicryl/absorbable suture. Dermabond was also applied to both incisions. The patient tolerated the procedure well and was transferred to recovery in stable condition. IMPRESSION: Successful placement of a right-sided chest port via  the right internal jugular vein. The port is ready for immediate use. Electronically Signed   By: Albin Felling M.D.   On: 12/06/2022 15:47    ASSESSMENT: Urothelial carcinoma of the kidney.  PLAN:    Urothelial carcinoma of the kidney: Confirmed by biopsy.  PET scan results on November 14, 2022 reviewed independently with no significant hypermetabolism of known malignancy.  Imaging does not reveal any metastatic disease.  After consultation with Dr. Erlene Quan, it is recommended that patient pursue neoadjuvant chemotherapy followed by surgical resection.  Patient will receive cisplatin and gemcitabine on day 1 with gemcitabine on day 8 and a 21-day cycle.  Plan to give 4 cycles and then refer back to urology for surgical resection.  Proceed with cycle 1, day 8 of treatment today.  Gemcitabine only.  Return to clinic in 2 weeks for further evaluation and consideration of cycle 2, day 1.  We did discuss the possibility of dose reducing chemotherapy if her fatigue persists. 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 Renal insufficiency: Creatinine is trended up slightly to 1.53.  Gemcitabine does not need to be dose reduced with renal insufficiency.  Continue to monitor closely while giving cisplatin.   Anemia: Chronic and unchanged. Thrombocytopenia: Mild.  Proceed with treatment as above.  Patient expressed understanding and was in agreement with this plan. She also understands that She can  call clinic at any time with any questions, concerns, or complaints.    Cancer Staging  Urothelial carcinoma of kidney [facility]) Staging form: Kidney, AJCC 8th Edition - Clinical stage from 12/01/2022: Stage Unknown (cTX, cN0, cM0) - Signed by Lloyd Huger, MD on 12/01/2022 Stage prefix: Initial diagnosis Histologic grade (G): G3 Histologic grading system: 4 grade system   Lloyd Huger, MD   12/18/2022 12:01 PM

## 2022-12-25 DIAGNOSIS — R519 Headache, unspecified: Secondary | ICD-10-CM | POA: Diagnosis not present

## 2022-12-25 DIAGNOSIS — C689 Malignant neoplasm of urinary organ, unspecified: Secondary | ICD-10-CM | POA: Diagnosis not present

## 2022-12-25 DIAGNOSIS — F0390 Unspecified dementia without behavioral disturbance: Secondary | ICD-10-CM | POA: Diagnosis not present

## 2022-12-25 DIAGNOSIS — Z87898 Personal history of other specified conditions: Secondary | ICD-10-CM | POA: Diagnosis not present

## 2022-12-31 MED FILL — Dexamethasone Sodium Phosphate Inj 100 MG/10ML: INTRAMUSCULAR | Qty: 1 | Status: AC

## 2022-12-31 MED FILL — Fosaprepitant Dimeglumine For IV Infusion 150 MG (Base Eq): INTRAVENOUS | Qty: 5 | Status: AC

## 2023-01-01 ENCOUNTER — Inpatient Hospital Stay: Payer: PPO

## 2023-01-01 ENCOUNTER — Inpatient Hospital Stay (HOSPITAL_BASED_OUTPATIENT_CLINIC_OR_DEPARTMENT_OTHER): Payer: PPO | Admitting: Oncology

## 2023-01-01 DIAGNOSIS — C642 Malignant neoplasm of left kidney, except renal pelvis: Secondary | ICD-10-CM | POA: Diagnosis not present

## 2023-01-01 DIAGNOSIS — Z5111 Encounter for antineoplastic chemotherapy: Secondary | ICD-10-CM | POA: Diagnosis not present

## 2023-01-01 LAB — CBC WITH DIFFERENTIAL (CANCER CENTER ONLY)
Abs Immature Granulocytes: 0.03 10*3/uL (ref 0.00–0.07)
Basophils Absolute: 0 10*3/uL (ref 0.0–0.1)
Basophils Relative: 1 %
Eosinophils Absolute: 0.1 10*3/uL (ref 0.0–0.5)
Eosinophils Relative: 2 %
HCT: 30.9 % — ABNORMAL LOW (ref 36.0–46.0)
Hemoglobin: 10 g/dL — ABNORMAL LOW (ref 12.0–15.0)
Immature Granulocytes: 1 %
Lymphocytes Relative: 32 %
Lymphs Abs: 1.5 10*3/uL (ref 0.7–4.0)
MCH: 27.7 pg (ref 26.0–34.0)
MCHC: 32.4 g/dL (ref 30.0–36.0)
MCV: 85.6 fL (ref 80.0–100.0)
Monocytes Absolute: 1 10*3/uL (ref 0.1–1.0)
Monocytes Relative: 22 %
Neutro Abs: 2 10*3/uL (ref 1.7–7.7)
Neutrophils Relative %: 42 %
Platelet Count: 304 10*3/uL (ref 150–400)
RBC: 3.61 MIL/uL — ABNORMAL LOW (ref 3.87–5.11)
RDW: 13.8 % (ref 11.5–15.5)
WBC Count: 4.5 10*3/uL (ref 4.0–10.5)
nRBC: 0 % (ref 0.0–0.2)

## 2023-01-01 LAB — CMP (CANCER CENTER ONLY)
ALT: 13 U/L (ref 0–44)
AST: 23 U/L (ref 15–41)
Albumin: 3.9 g/dL (ref 3.5–5.0)
Alkaline Phosphatase: 48 U/L (ref 38–126)
Anion gap: 7 (ref 5–15)
BUN: 18 mg/dL (ref 8–23)
CO2: 25 mmol/L (ref 22–32)
Calcium: 9 mg/dL (ref 8.9–10.3)
Chloride: 105 mmol/L (ref 98–111)
Creatinine: 1.16 mg/dL — ABNORMAL HIGH (ref 0.44–1.00)
GFR, Estimated: 49 mL/min — ABNORMAL LOW (ref >60)
Glucose, Bld: 107 mg/dL — ABNORMAL HIGH (ref 70–99)
Potassium: 3.8 mmol/L (ref 3.5–5.1)
Sodium: 137 mmol/L (ref 135–145)
Total Bilirubin: 0.4 mg/dL (ref 0.3–1.2)
Total Protein: 6.5 g/dL (ref 6.5–8.1)

## 2023-01-01 LAB — MAGNESIUM: Magnesium: 2 mg/dL (ref 1.7–2.4)

## 2023-01-01 MED ORDER — PALONOSETRON HCL INJECTION 0.25 MG/5ML
0.2500 mg | Freq: Once | INTRAVENOUS | Status: AC
  Administered 2023-01-01: 0.25 mg via INTRAVENOUS
  Filled 2023-01-01: qty 5

## 2023-01-01 MED ORDER — MAGNESIUM SULFATE 2 GM/50ML IV SOLN
2.0000 g | Freq: Once | INTRAVENOUS | Status: AC
  Administered 2023-01-01: 2 g via INTRAVENOUS
  Filled 2023-01-01: qty 50

## 2023-01-01 MED ORDER — SODIUM CHLORIDE 0.9 % IV SOLN
10.0000 mg | Freq: Once | INTRAVENOUS | Status: AC
  Administered 2023-01-01: 10 mg via INTRAVENOUS
  Filled 2023-01-01: qty 10

## 2023-01-01 MED ORDER — HEPARIN SOD (PORK) LOCK FLUSH 100 UNIT/ML IV SOLN
500.0000 [IU] | Freq: Once | INTRAVENOUS | Status: AC | PRN
  Administered 2023-01-01: 500 [IU]
  Filled 2023-01-01: qty 5

## 2023-01-01 MED ORDER — POTASSIUM CHLORIDE IN NACL 20-0.9 MEQ/L-% IV SOLN
Freq: Once | INTRAVENOUS | Status: AC
  Filled 2023-01-01: qty 1000

## 2023-01-01 MED ORDER — SODIUM CHLORIDE 0.9 % IV SOLN
150.0000 mg | Freq: Once | INTRAVENOUS | Status: AC
  Administered 2023-01-01: 150 mg via INTRAVENOUS
  Filled 2023-01-01: qty 150

## 2023-01-01 MED ORDER — SODIUM CHLORIDE 0.9 % IV SOLN
70.0000 mg/m2 | Freq: Once | INTRAVENOUS | Status: AC
  Administered 2023-01-01: 100 mg via INTRAVENOUS
  Filled 2023-01-01: qty 100

## 2023-01-01 MED ORDER — SODIUM CHLORIDE 0.9 % IV SOLN
1000.0000 mg/m2 | Freq: Once | INTRAVENOUS | Status: AC
  Administered 2023-01-01: 1406 mg via INTRAVENOUS
  Filled 2023-01-01: qty 37

## 2023-01-01 MED ORDER — SODIUM CHLORIDE 0.9 % IV SOLN
Freq: Once | INTRAVENOUS | Status: AC
  Filled 2023-01-01: qty 250

## 2023-01-01 NOTE — Patient Instructions (Signed)
[place]  Discharge Instructions: Thank you for choosing [facility] to provide your oncology and hematology care.  If you have a lab appointment with the [facility], please go directly to the [facility] and check in at the registration area.  Wear comfortable clothing and clothing appropriate for easy access to any Portacath or PICC line.   We strive to give you quality time with your provider. You may need to reschedule your appointment if you arrive late (15 or more minutes).  Arriving late affects you and other patients whose appointments are after yours.  Also, if you miss three or more appointments without notifying the office, you may be dismissed from the clinic at the provider's discretion.      For prescription refill requests, have your pharmacy contact our office and allow 72 hours for refills to be completed.    Today you received the following chemotherapy and/or immunotherapy agents Gemzar & Cisplatin      To help prevent nausea and vomiting after your treatment, we encourage you to take your nausea medication as directed.  BELOW ARE SYMPTOMS THAT SHOULD BE REPORTED IMMEDIATELY: *FEVER GREATER THAN 100.4 F (38 C) OR HIGHER *CHILLS OR SWEATING *NAUSEA AND VOMITING THAT IS NOT CONTROLLED WITH YOUR NAUSEA MEDICATION *UNUSUAL SHORTNESS OF BREATH *UNUSUAL BRUISING OR BLEEDING *URINARY PROBLEMS (pain or burning when urinating, or frequent urination) *BOWEL PROBLEMS (unusual diarrhea, constipation, pain near the anus) TENDERNESS IN MOUTH AND THROAT WITH OR WITHOUT PRESENCE OF ULCERS (sore throat, sores in mouth, or a toothache) UNUSUAL RASH, SWELLING OR PAIN  UNUSUAL VAGINAL DISCHARGE OR ITCHING   Items with * indicate a potential emergency and should be followed up as soon as possible or go to the Emergency Department if any problems should occur.  Please show the CHEMOTHERAPY ALERT CARD or IMMUNOTHERAPY ALERT CARD at  check-in to the Emergency Department and triage nurse.  Should you have questions after your visit or need to cancel or reschedule your appointment, please contact [facility]  [phone] and follow the prompts.  Office hours are 8:00 a.m. to 4:30 p.m. Monday - Friday. Please note that voicemails left after 4:00 p.m. may not be returned until the following business day.  We are closed weekends and major holidays. You have access to a nurse at all times for urgent questions. Please call the main number to the clinic [phone] and follow the prompts.  For any non-urgent questions, you may also contact your provider using MyChart. We now offer e-Visits for anyone 36 and older to request care online for non-urgent symptoms. For details visit [email or web address].   Also download the MyChart app! Go to the app store, search "MyChart", open the app, select [facility], and log in with your MyChart username and password.

## 2023-01-01 NOTE — Progress Notes (Signed)
[name]  Telephone:(336) [phone] Fax:(336) [phone]  ID: Jessica Morrison OB: 1946/03/30  MR#: [id number]  [id number]  Patient Care Team: Idelle Crouch, MD as PCP - General (Internal Medicine)  CHIEF COMPLAINT: Urothelial carcinoma of the kidney.  INTERVAL HISTORY: Patient returns to clinic today for further evaluation and consideration of cycle 2, day 1 of cisplatin and gemcitabine.  Her fatigue has improved along with her appetite.  She currently feels well.  She has no neurologic complaints.  She denies any recent fevers or illnesses.  She has a good appetite and denies weight loss.  She has no chest pain, shortness of breath, cough, or hemoptysis.  She denies any nausea, vomiting, constipation, or diarrhea.  She has no other urinary complaints.  Patient offers no further specific complaints today.  REVIEW OF SYSTEMS:   Review of Systems  Constitutional:  Positive for malaise/fatigue. Negative for fever and weight loss.  Respiratory: Negative.  Negative for cough and shortness of breath.   Cardiovascular: Negative.  Negative for chest pain and leg swelling.  Gastrointestinal: Negative.  Negative for abdominal pain.  Genitourinary: Negative.  Negative for hematuria.  Musculoskeletal: Negative.  Negative for back pain.  Skin: Negative.  Negative for rash.  Neurological: Negative.  Negative for dizziness, focal weakness, weakness and headaches.  Psychiatric/Behavioral: Negative.  The patient is not nervous/anxious.     As per HPI. Otherwise, a complete review of systems is negative.  PAST MEDICAL HISTORY: Past Medical History:  Diagnosis Date   Back pain    Basal cell carcinoma 08/04/2020   0.5cm above the left mid brow, EDC 09/15/20   Cancer ([place])    Melanoma   Curvature of spine    Dysplastic nevus 04/30/2013   left post shoulder, left parasternal   Dysplastic nevus 05/04/2014   right prox ant deltoid, left lat buttock   Dysplastic nevus  03/26/2019   right lat neck adjacent to the inf top of MM in situ site scar   Dysplastic nevus 10/29/2019   upper back left paraspinal at level of mid scapula   Dysplastic nevus 03/31/2020, 05/12/20 shave removal   Left mid pretibial. Moderate to severe atypia, deep margin involved.   Dysplastic nevus 06/29/2021   L ant deltoid, moderat to severe atypia, excised 08/15/21   Endometriosis    Endometriosis    Fibrocystic disease of breast    Fibroid    GERD (gastroesophageal reflux disease)    History of kidney stones    HLD (hyperlipidemia)    Hyperlipemia    Hypertension    Kidney stone    Melanoma in situ ([place]) 07/03/2018   right lat neck inf to right angle of mandible, excised: 07/29/2018, margins free   Nephrolithiasis    Osteopenia    Osteoporosis    Pelvic pain in female    Raynaud disease    Thyromegaly     PAST SURGICAL HISTORY: Past Surgical History:  Procedure Laterality Date   ABDOMINAL HYSTERECTOMY     BILATERAL OOPHORECTOMY  1999   CESAREAN SECTION     COLONOSCOPY WITH PROPOFOL N/A 05/09/2015   Procedure: COLONOSCOPY WITH PROPOFOL;  Surgeon: Josefine Class, MD;  Location: [facility] ENDOSCOPY;  Service: Endoscopy;  Laterality: N/A;   CYSTOSCOPY WITH RETROGRADE PYELOGRAM, URETEROSCOPY AND STENT PLACEMENT Left 10/09/2022   Procedure: CYSTOSCOPY WITH RETROGRADE PYELOGRAM, URETEROSCOPY AND STENT PLACEMENT;  Surgeon: Abbie Sons, MD;  Location: [facility] ORS;  Service: Urology;  Laterality: Left;   IR IMAGING GUIDED PORT  INSERTION  12/06/2022   MELANOMA EXCISION Right 07/29/2018   lateral neck inf. to right angle of mandible   OOPHORECTOMY     URETERAL BIOPSY Left 10/09/2022   Procedure: URETERAL BIOPSY;  Surgeon: Abbie Sons, MD;  Location: [facility] ORS;  Service: Urology;  Laterality: Left;    FAMILY HISTORY: Family History  Problem Relation Age of Onset   Emphysema Father    Cancer Neg Hx    Heart disease Neg Hx    Diabetes Neg Hx    Breast cancer Neg Hx      ADVANCED DIRECTIVES (Y/N):  N  HEALTH MAINTENANCE: Social History   Tobacco Use   Smoking status: Never    Passive exposure: Never   Smokeless tobacco: Never  Vaping Use   Vaping Use: Never used  Substance Use Topics   Alcohol use: No   Drug use: No     Colonoscopy:  PAP:  Bone density:  Lipid panel:  Allergies  Allergen Reactions   Codeine Nausea And Vomiting   Neosporin Wound Cleanser [Benzalkonium Chloride] Other (See Comments)    Skin blisters   Penicillins     Current Outpatient Medications  Medication Sig Dispense Refill   acetaminophen (TYLENOL) 325 MG tablet Take 650 mg by mouth every 6 (six) hours as needed.     calcium-vitamin D (OSCAL WITH D) 500-200 MG-UNIT tablet 1 tablet daily with breakfast.     Cholecalciferol 25 MCG (1000 UT) tablet Take 1,000 Units by mouth daily.     cyanocobalamin 1000 MCG tablet Take 1,000 mcg by mouth daily.     donepezil (ARICEPT) 10 MG tablet TAKE 1 TABLET BY MOUTH ONCE EVERY EVENING     lidocaine-prilocaine (EMLA) cream Apply to affected area once 30 g 3   meloxicam (MOBIC) 7.5 MG tablet Take 1 tablet by mouth daily.     memantine (NAMENDA) 5 MG tablet Take 5 mg by mouth 2 (two) times daily.     omeprazole (PRILOSEC) 40 MG capsule 40 mg daily.     ondansetron (ZOFRAN) 8 MG tablet Take 1 tablet (8 mg total) by mouth every 8 (eight) hours as needed for nausea or vomiting. Start on the third day after cisplatin. 60 tablet 2   prochlorperazine (COMPAZINE) 10 MG tablet Take 1 tablet (10 mg total) by mouth every 6 (six) hours as needed (Nausea or vomiting). 60 tablet 2   raloxifene (EVISTA) 60 MG tablet TAKE 1 TABLET BY MOUTH ONCE A DAY 90 tablet 1   rosuvastatin (CRESTOR) 5 MG tablet Take 5 mg by mouth daily.     No current facility-administered medications for this visit.   Facility-Administered Medications Ordered in Other Visits  Medication Dose Route Frequency Provider Last Rate Last Admin   CISplatin (PLATINOL) 100 mg in  sodium chloride 0.9 % 500 mL chemo infusion  70 mg/m2 (Treatment Plan Recorded) Intravenous Once Lloyd Huger, MD       dexamethasone (DECADRON) 10 mg in sodium chloride 0.9 % 50 mL IVPB  10 mg Intravenous Once Lloyd Huger, MD       fosaprepitant (EMEND) 150 mg in sodium chloride 0.9 % 145 mL IVPB  150 mg Intravenous Once Lloyd Huger, MD       gemcitabine (GEMZAR) 1,406 mg in sodium chloride 0.9 % 250 mL chemo infusion  1,000 mg/m2 (Treatment Plan Recorded) Intravenous Once Lloyd Huger, MD       heparin lock flush 100 unit/mL  500 Units Intracatheter Once PRN  Lloyd Huger, MD       magnesium sulfate IVPB 2 g 50 mL  2 g Intravenous Once Lloyd Huger, MD 50 mL/hr at 01/01/23 0921 2 g at 01/01/23 0921   palonosetron (ALOXI) injection 0.25 mg  0.25 mg Intravenous Once Lloyd Huger, MD        OBJECTIVE: Vitals:   01/01/23 0832  BP: 138/69  Pulse: 71  Temp: 98.1 F (36.7 C)  SpO2: 100%     Body mass index is 22.82 kg/m.    ECOG FS:0 - Asymptomatic  General: Well-developed, well-nourished, no acute distress. Eyes: Pink conjunctiva, anicteric sclera. HEENT: Normocephalic, moist mucous membranes. Lungs: No audible wheezing or coughing. Heart: Regular rate and rhythm. Abdomen: Soft, nontender, no obvious distention. Musculoskeletal: No edema, cyanosis, or clubbing. Neuro: Alert, answering all questions appropriately. Cranial nerves grossly intact. Skin: No rashes or petechiae noted. Psych: Normal affect.  LAB RESULTS:  Lab Results  Component Value Date   NA 137 01/01/2023   K 3.8 01/01/2023   CL 105 01/01/2023   CO2 25 01/01/2023   GLUCOSE 107 (H) 01/01/2023   BUN 18 01/01/2023   CREATININE 1.16 (H) 01/01/2023   CALCIUM 9.0 01/01/2023   PROT 6.5 01/01/2023   ALBUMIN 3.9 01/01/2023   AST 23 01/01/2023   ALT 13 01/01/2023   ALKPHOS 48 01/01/2023   BILITOT 0.4 01/01/2023   GFRNONAA 49 (L) 01/01/2023    Lab Results  Component  Value Date   WBC 4.5 01/01/2023   NEUTROABS 2.0 01/01/2023   HGB 10.0 (L) 01/01/2023   HCT 30.9 (L) 01/01/2023   MCV 85.6 01/01/2023   PLT 304 01/01/2023     STUDIES: IR IMAGING GUIDED PORT INSERTION  Result Date: 12/06/2022 INDICATION: port placement for chemotherapy EXAM: Chest port placement using ultrasound and fluoroscopic guidance MEDICATIONS: Documented in the EMR ANESTHESIA/SEDATION: Moderate (conscious) sedation was employed during this procedure. A total of Versed 1.5 mg and Fentanyl 75 mcg was administered intravenously. Moderate Sedation Time: 20 minutes. The patient's level of consciousness and vital signs were monitored continuously by radiology nursing throughout the procedure under my direct supervision. FLUOROSCOPY TIME:  Fluoroscopy Time: 1.1 minutes (1 mGy) COMPLICATIONS: None immediate. PROCEDURE: Informed written consent was obtained from the patient after a thorough discussion of the procedural risks, benefits and alternatives. All questions were addressed. Maximal Sterile Barrier Technique was utilized including caps, mask, sterile gowns, sterile gloves, sterile drape, hand hygiene and skin antiseptic. A timeout was performed prior to the initiation of the procedure. The patient was placed supine on the exam table. The right neck and chest was prepped and draped in the standard sterile fashion. A preliminary ultrasound of the right neck was performed and demonstrates a patent right internal jugular vein. A permanent ultrasound image was stored in the electronic medical record. The overlying skin was anesthetized with 1% Lidocaine. Using ultrasound guidance, access was obtained into the right internal jugular vein using a 21 gauge micropuncture set. A wire was advanced into the SVC, a short incision was made at the puncture site, and serial dilatation performed. Next, in an ipsilateral infraclavicular location, an incision was made at the site of the subcutaneous reservoir. Blunt  dissection was used to open a pocket to contain the reservoir. A subcutaneous tunnel was then created from the port site to the puncture site. A(n) 8 Fr single lumen catheter was advanced through the tunnel. The catheter was attached to the port and this was placed in the subcutaneous  pocket. Under fluoroscopic guidance, a peel away sheath was placed, and the catheter was trimmed to the appropriate length and was advanced into the central veins. The catheter length is 21 cm. The tip of the catheter lies near the superior cavoatrial junction. The port flushes and aspirates appropriately. The port was flushed and locked with heparinized saline. The port pocket was closed in 2 layers using 3-0 and 4-0 Vicryl/absorbable suture. Dermabond was also applied to both incisions. The patient tolerated the procedure well and was transferred to recovery in stable condition. IMPRESSION: Successful placement of a right-sided chest port via the right internal jugular vein. The port is ready for immediate use. Electronically Signed   By: Albin Felling M.D.   On: 12/06/2022 15:47    ASSESSMENT: Urothelial carcinoma of the kidney.  PLAN:    Urothelial carcinoma of the kidney: Confirmed by biopsy.  PET scan results on November 14, 2022 reviewed independently with no significant hypermetabolism of known malignancy.  Imaging does not reveal any metastatic disease.  After consultation with Dr. Erlene Quan, it is recommended that patient pursue neoadjuvant chemotherapy followed by surgical resection.  Patient will receive cisplatin and gemcitabine on day 1 with gemcitabine on day 8 and a 21-day cycle.  Plan to give 4 cycles and then refer back to urology for surgical resection.  Proceed with cycle 2, day 1 of treatment today.  Return to clinic in 1 week for further evaluation and consideration of cycle 2, day 8.   Hematuria: Patient does not complain of this today.  Secondary to malignancy. Renal insufficiency: Creatinine improved to  1.16.  Gemcitabine does not need to be dose reduced with renal insufficiency.  Continue to monitor closely while giving cisplatin.   Anemia: Hemoglobin has trended down to 10.0, monitor.   Thrombocytopenia: Resolved.  Patient expressed understanding and was in agreement with this plan. She also understands that She can call clinic at any time with any questions, concerns, or complaints.    Cancer Staging  Urothelial carcinoma of kidney [facility]) Staging form: Kidney, AJCC 8th Edition - Clinical stage from 12/01/2022: Stage Unknown (cTX, cN0, cM0) - Signed by Lloyd Huger, MD on 12/01/2022 Stage prefix: Initial diagnosis Histologic grade (G): G3 Histologic grading system: 4 grade system   Lloyd Huger, MD   01/01/2023 10:01 AM

## 2023-01-01 NOTE — Progress Notes (Signed)
Ok per Dr. Grayland Ormond to run post hydration fluids concurrently with cisplatin

## 2023-01-02 ENCOUNTER — Other Ambulatory Visit: Payer: Self-pay | Admitting: Student

## 2023-01-02 DIAGNOSIS — R519 Headache, unspecified: Secondary | ICD-10-CM

## 2023-01-08 ENCOUNTER — Inpatient Hospital Stay: Payer: PPO

## 2023-01-08 ENCOUNTER — Encounter: Payer: Self-pay | Admitting: Oncology

## 2023-01-08 ENCOUNTER — Inpatient Hospital Stay (HOSPITAL_BASED_OUTPATIENT_CLINIC_OR_DEPARTMENT_OTHER): Payer: PPO | Admitting: Oncology

## 2023-01-08 DIAGNOSIS — C642 Malignant neoplasm of left kidney, except renal pelvis: Secondary | ICD-10-CM

## 2023-01-08 DIAGNOSIS — Z5111 Encounter for antineoplastic chemotherapy: Secondary | ICD-10-CM | POA: Diagnosis not present

## 2023-01-08 LAB — CMP (CANCER CENTER ONLY)
ALT: 28 U/L (ref 0–44)
AST: 24 U/L (ref 15–41)
Albumin: 3.8 g/dL (ref 3.5–5.0)
Alkaline Phosphatase: 55 U/L (ref 38–126)
Anion gap: 9 (ref 5–15)
BUN: 31 mg/dL — ABNORMAL HIGH (ref 8–23)
CO2: 25 mmol/L (ref 22–32)
Calcium: 9.1 mg/dL (ref 8.9–10.3)
Chloride: 101 mmol/L (ref 98–111)
Creatinine: 1.13 mg/dL — ABNORMAL HIGH (ref 0.44–1.00)
GFR, Estimated: 50 mL/min — ABNORMAL LOW (ref >60)
Glucose, Bld: 103 mg/dL — ABNORMAL HIGH (ref 70–99)
Potassium: 3.7 mmol/L (ref 3.5–5.1)
Sodium: 135 mmol/L (ref 135–145)
Total Bilirubin: 0.4 mg/dL (ref 0.3–1.2)
Total Protein: 6.6 g/dL (ref 6.5–8.1)

## 2023-01-08 LAB — CBC WITH DIFFERENTIAL (CANCER CENTER ONLY)
Abs Immature Granulocytes: 0.04 10*3/uL (ref 0.00–0.07)
Basophils Absolute: 0 10*3/uL (ref 0.0–0.1)
Basophils Relative: 0 %
Eosinophils Absolute: 0 10*3/uL (ref 0.0–0.5)
Eosinophils Relative: 1 %
HCT: 32.9 % — ABNORMAL LOW (ref 36.0–46.0)
Hemoglobin: 11 g/dL — ABNORMAL LOW (ref 12.0–15.0)
Immature Granulocytes: 1 %
Lymphocytes Relative: 20 %
Lymphs Abs: 1 10*3/uL (ref 0.7–4.0)
MCH: 27.6 pg (ref 26.0–34.0)
MCHC: 33.4 g/dL (ref 30.0–36.0)
MCV: 82.7 fL (ref 80.0–100.0)
Monocytes Absolute: 0.6 10*3/uL (ref 0.1–1.0)
Monocytes Relative: 11 %
Neutro Abs: 3.5 10*3/uL (ref 1.7–7.7)
Neutrophils Relative %: 67 %
Platelet Count: 165 10*3/uL (ref 150–400)
RBC: 3.98 MIL/uL (ref 3.87–5.11)
RDW: 13.6 % (ref 11.5–15.5)
WBC Count: 5.2 10*3/uL (ref 4.0–10.5)
nRBC: 0 % (ref 0.0–0.2)

## 2023-01-08 MED ORDER — PROCHLORPERAZINE MALEATE 10 MG PO TABS
10.0000 mg | ORAL_TABLET | Freq: Once | ORAL | Status: AC
  Administered 2023-01-08: 10 mg via ORAL
  Filled 2023-01-08: qty 1

## 2023-01-08 MED ORDER — HEPARIN SOD (PORK) LOCK FLUSH 100 UNIT/ML IV SOLN
500.0000 [IU] | Freq: Once | INTRAVENOUS | Status: AC | PRN
  Administered 2023-01-08: 500 [IU]
  Filled 2023-01-08: qty 5

## 2023-01-08 MED ORDER — SODIUM CHLORIDE 0.9 % IV SOLN
1000.0000 mg/m2 | Freq: Once | INTRAVENOUS | Status: AC
  Administered 2023-01-08: 1406 mg via INTRAVENOUS
  Filled 2023-01-08: qty 26.28

## 2023-01-08 MED ORDER — SODIUM CHLORIDE 0.9 % IV SOLN
Freq: Once | INTRAVENOUS | Status: AC
  Filled 2023-01-08: qty 250

## 2023-01-08 MED ORDER — DEXAMETHASONE 4 MG PO TABS: 4.0000 mg | ORAL_TABLET | Freq: Every day | ORAL | 2 refills | Status: DC

## 2023-01-08 NOTE — Progress Notes (Signed)
Daughter has concerns about patient not eating. Patient states she has been nauseated. She has been taking compazine which does help.

## 2023-01-08 NOTE — Progress Notes (Signed)
[place]  Telephone:(336) [phone] Fax:(336) [phone]  ID: Jessica Morrison OB: [birth year]  MR#: [id number]  [id number]  Patient Care Team: Idelle Crouch, MD as PCP - General (Internal Medicine)  CHIEF COMPLAINT: Urothelial carcinoma of the kidney.  INTERVAL HISTORY: Patient returns to clinic today for further evaluation and consideration of cycle 2, day 8 of cisplatin and gemcitabine.  Gemcitabine only.  She has a poor appetite and some weight loss this past week.  She also has increased weakness and fatigue.  She has no neurologic complaints.  She denies any recent fevers or illnesses.  She has a good appetite and denies weight loss.  She has no chest pain, shortness of breath, cough, or hemoptysis.  She denies any nausea, vomiting, constipation, or diarrhea.  She has no other urinary complaints.  Patient offers no further specific complaints today.  REVIEW OF SYSTEMS:   Review of Systems  Constitutional:  Positive for malaise/fatigue and weight loss. Negative for fever.  Respiratory: Negative.  Negative for cough and shortness of breath.   Cardiovascular: Negative.  Negative for chest pain and leg swelling.  Gastrointestinal: Negative.  Negative for abdominal pain.  Genitourinary: Negative.  Negative for hematuria.  Musculoskeletal: Negative.  Negative for back pain.  Skin: Negative.  Negative for rash.  Neurological:  Positive for weakness. Negative for dizziness, focal weakness and headaches.  Psychiatric/Behavioral: Negative.  The patient is not nervous/anxious.     As per HPI. Otherwise, a complete review of systems is negative.  PAST MEDICAL HISTORY: Past Medical History:  Diagnosis Date   Back pain    Basal cell carcinoma 08/04/2020   0.5cm above the left mid brow, EDC 09/15/20   Cancer ([place])    Melanoma   Curvature of spine    Dysplastic nevus 04/30/2013   left post shoulder, left parasternal   Dysplastic nevus 05/04/2014   right prox  ant deltoid, left lat buttock   Dysplastic nevus 03/26/2019   right lat neck adjacent to the inf top of MM in situ site scar   Dysplastic nevus 10/29/2019   upper back left paraspinal at level of mid scapula   Dysplastic nevus 03/31/2020, 05/12/20 shave removal   Left mid pretibial. Moderate to severe atypia, deep margin involved.   Dysplastic nevus 06/29/2021   L ant deltoid, moderat to severe atypia, excised 08/15/21   Endometriosis    Endometriosis    Fibrocystic disease of breast    Fibroid    GERD (gastroesophageal reflux disease)    History of kidney stones    HLD (hyperlipidemia)    Hyperlipemia    Hypertension    Kidney stone    Melanoma in situ ([place]) 07/03/2018   right lat neck inf to right angle of mandible, excised: 07/29/2018, margins free   Nephrolithiasis    Osteopenia    Osteoporosis    Pelvic pain in female    Raynaud disease    Thyromegaly     PAST SURGICAL HISTORY: Past Surgical History:  Procedure Laterality Date   ABDOMINAL HYSTERECTOMY     BILATERAL OOPHORECTOMY  1999   CESAREAN SECTION     COLONOSCOPY WITH PROPOFOL N/A 05/09/2015   Procedure: COLONOSCOPY WITH PROPOFOL;  Surgeon: Josefine Class, MD;  Location[facility] ENDOSCOPY;  Service: Endoscopy;  Laterality: N/A;   CYSTOSCOPY WITH RETROGRADE PYELOGRAM, URETEROSCOPY AND STENT PLACEMENT Left 10/09/2022   Procedure: CYSTOSCOPY WITH RETROGRADE PYELOGRAM, URETEROSCOPY AND STENT PLACEMENT;  Surgeon: Abbie Sons, MD;  Location: [facility] ORS;  Service: Urology;  Laterality: Left;   IR IMAGING GUIDED PORT INSERTION  12/06/2022   MELANOMA EXCISION Right 07/29/2018   lateral neck inf. to right angle of mandible   OOPHORECTOMY     URETERAL BIOPSY Left 10/09/2022   Procedure: URETERAL BIOPSY;  Surgeon: Abbie Sons, MD;  Location: [facility] ORS;  Service: Urology;  Laterality: Left;    FAMILY HISTORY: Family History  Problem Relation Age of Onset   Emphysema Father    Cancer Neg Hx    Heart disease Neg  Hx    Diabetes Neg Hx    Breast cancer Neg Hx     ADVANCED DIRECTIVES (Y/N):  N  HEALTH MAINTENANCE: Social History   Tobacco Use   Smoking status: Never    Passive exposure: Never   Smokeless tobacco: Never  Vaping Use   Vaping Use: Never used  Substance Use Topics   Alcohol use: No   Drug use: No     Colonoscopy:  PAP:  Bone density:  Lipid panel:  Allergies  Allergen Reactions   Codeine Nausea And Vomiting   Neosporin Wound Cleanser [Benzalkonium Chloride] Other (See Comments)    Skin blisters   Penicillins     Current Outpatient Medications  Medication Sig Dispense Refill   acetaminophen (TYLENOL) 325 MG tablet Take 650 mg by mouth every 6 (six) hours as needed.     calcium-vitamin D (OSCAL WITH D) 500-200 MG-UNIT tablet 1 tablet daily with breakfast.     Cholecalciferol 25 MCG (1000 UT) tablet Take 1,000 Units by mouth daily.     cyanocobalamin 1000 MCG tablet Take 1,000 mcg by mouth daily.     donepezil (ARICEPT) 10 MG tablet TAKE 1 TABLET BY MOUTH ONCE EVERY EVENING     lidocaine-prilocaine (EMLA) cream Apply to affected area once 30 g 3   memantine (NAMENDA) 5 MG tablet Take 5 mg by mouth 2 (two) times daily.     omeprazole (PRILOSEC) 40 MG capsule 40 mg daily.     ondansetron (ZOFRAN) 8 MG tablet Take 1 tablet (8 mg total) by mouth every 8 (eight) hours as needed for nausea or vomiting. Start on the third day after cisplatin. 60 tablet 2   prochlorperazine (COMPAZINE) 10 MG tablet Take 1 tablet (10 mg total) by mouth every 6 (six) hours as needed (Nausea or vomiting). 60 tablet 2   raloxifene (EVISTA) 60 MG tablet TAKE 1 TABLET BY MOUTH ONCE A DAY 90 tablet 1   rosuvastatin (CRESTOR) 5 MG tablet Take 5 mg by mouth daily.     meloxicam (MOBIC) 7.5 MG tablet Take 1 tablet by mouth daily. (Patient not taking: Reported on 01/08/2023)     No current facility-administered medications for this visit.    OBJECTIVE: Vitals:   01/08/23 0938  BP: (!) 141/69   Pulse: 69  Resp: 16  Temp: 97.9 F (36.6 C)  SpO2: 99%     Body mass index is 21.74 kg/m.    ECOG FS:1 - Symptomatic but completely ambulatory  General: Well-developed, well-nourished, no acute distress. Eyes: Pink conjunctiva, anicteric sclera. HEENT: Normocephalic, moist mucous membranes. Lungs: No audible wheezing or coughing. Heart: Regular rate and rhythm. Abdomen: Soft, nontender, no obvious distention. Musculoskeletal: No edema, cyanosis, or clubbing. Neuro: Alert, answering all questions appropriately. Cranial nerves grossly intact. Skin: No rashes or petechiae noted. Psych: Normal affect.  LAB RESULTS:  Lab Results  Component Value Date   NA 135 01/08/2023   K 3.7 01/08/2023   CL  101 01/08/2023   CO2 25 01/08/2023   GLUCOSE 103 (H) 01/08/2023   BUN 31 (H) 01/08/2023   CREATININE 1.13 (H) 01/08/2023   CALCIUM 9.1 01/08/2023   PROT 6.6 01/08/2023   ALBUMIN 3.8 01/08/2023   AST 24 01/08/2023   ALT 28 01/08/2023   ALKPHOS 55 01/08/2023   BILITOT 0.4 01/08/2023   GFRNONAA 50 (L) 01/08/2023    Lab Results  Component Value Date   WBC 5.2 01/08/2023   NEUTROABS 3.5 01/08/2023   HGB 11.0 (L) 01/08/2023   HCT 32.9 (L) 01/08/2023   MCV 82.7 01/08/2023   PLT 165 01/08/2023     STUDIES: No results found.  ASSESSMENT: Urothelial carcinoma of the kidney.  PLAN:    Urothelial carcinoma of the kidney: Confirmed by biopsy.  PET scan results on November 14, 2022 reviewed independently with no significant hypermetabolism of known malignancy.  Imaging does not reveal any metastatic disease.  After consultation with Dr. Erlene Quan, it is recommended that patient pursue neoadjuvant chemotherapy followed by surgical resection.  Patient will receive cisplatin and gemcitabine on day 1 with gemcitabine on day 8 and a 21-day cycle.  Plan to give 4 cycles and then refer back to urology for surgical resection.  Proceed with cycle 2, day 8 of treatment today.  Gemcitabine only.   Return to clinic in 2 weeks for further evaluation and consideration of cycle 3, day 1.   Hematuria: Patient does not complain of this today.  Secondary to malignancy. Renal insufficiency: Mild.  Creatinine is 1.13 today.  Gemcitabine does not need to be dose reduced with renal insufficiency.  Continue to monitor closely while giving cisplatin.   Anemia: Chronic and unchanged.  Patient's hemoglobin is 11.0 today. Thrombocytopenia: Resolved. Poor appetite: Patient was given a prescription for dexamethasone.  Patient expressed understanding and was in agreement with this plan. She also understands that She can call clinic at any time with any questions, concerns, or complaints.    Cancer Staging  Urothelial carcinoma of kidney [facility]) Staging form: Kidney, AJCC 8th Edition - Clinical stage from 12/01/2022: Stage Unknown (cTX, cN0, cM0) - Signed by Lloyd Huger, MD on 12/01/2022 Stage prefix: Initial diagnosis Histologic grade (G): G3 Histologic grading system: 4 grade system   Lloyd Huger, MD   01/08/2023 10:04 AM

## 2023-01-08 NOTE — Patient Instructions (Signed)
[facility] [place] [facility]  Discharge Instructions: Thank you for choosing [facility] to provide your oncology and hematology care.  If you have a lab appointment with the Cancer Center, please go directly to the Cancer Center and check in at the registration area.  Wear comfortable clothing and clothing appropriate for easy access to any Portacath or PICC line.   We strive to give you quality time with your provider. You may need to reschedule your appointment if you arrive late (15 or more minutes).  Arriving late affects you and other patients whose appointments are after yours.  Also, if you miss three or more appointments without notifying the office, you may be dismissed from the clinic at the provider's discretion.      For prescription refill requests, have your pharmacy contact our office and allow 72 hours for refills to be completed.     To help prevent nausea and vomiting after your treatment, we encourage you to take your nausea medication as directed.  BELOW ARE SYMPTOMS THAT SHOULD BE REPORTED IMMEDIATELY: *FEVER GREATER THAN 100.4 F (38 C) OR HIGHER *CHILLS OR SWEATING *NAUSEA AND VOMITING THAT IS NOT CONTROLLED WITH YOUR NAUSEA MEDICATION *UNUSUAL SHORTNESS OF BREATH *UNUSUAL BRUISING OR BLEEDING *URINARY PROBLEMS (pain or burning when urinating, or frequent urination) *BOWEL PROBLEMS (unusual diarrhea, constipation, pain near the anus) TENDERNESS IN MOUTH AND THROAT WITH OR WITHOUT PRESENCE OF ULCERS (sore throat, sores in mouth, or a toothache) UNUSUAL RASH, SWELLING OR PAIN  UNUSUAL VAGINAL DISCHARGE OR ITCHING   Items with * indicate a potential emergency and should be followed up as soon as possible or go to the Emergency Department if any problems should occur.  Please show the CHEMOTHERAPY ALERT CARD or IMMUNOTHERAPY ALERT CARD at check-in to the Emergency Department and triage nurse.  Should you have questions after your visit  or need to cancel or reschedule your appointment, please contact [facility] [place] [facility]  [phone] and follow the prompts.  Office hours are 8:00 a.m. to 4:30 p.m. Monday - Friday. Please note that voicemails left after 4:00 p.m. may not be returned until the following business day.  We are closed weekends and major holidays. You have access to a nurse at all times for urgent questions. Please call the main number to the clinic [phone] and follow the prompts.  For any non-urgent questions, you may also contact your provider using MyChart. We now offer e-Visits for anyone 18 and older to request care online for non-urgent symptoms. For details visit [email or web address].   Also download the MyChart app! Go to the app store, search "MyChart", open the app, select [place], and log in with your MyChart username and password.    

## 2023-01-10 ENCOUNTER — Ambulatory Visit
Admission: RE | Admit: 2023-01-10 | Discharge: 2023-01-10 | Disposition: A | Discharge status: Home / Self Care | Payer: PPO | Source: Ambulatory Visit | Attending: Student | Admitting: Student

## 2023-01-10 DIAGNOSIS — R519 Headache, unspecified: Secondary | ICD-10-CM | POA: Insufficient documentation

## 2023-01-11 ENCOUNTER — Other Ambulatory Visit: Payer: PPO

## 2023-01-14 ENCOUNTER — Telehealth: Payer: Self-pay | Admitting: *Deleted

## 2023-01-14 NOTE — Telephone Encounter (Signed)
Call placed to patients daughter to follow up on how patient is feeling after chemotherapy treatment last week, assess any needs. Jessica Morrison reports patient has been feeling much better and her appetite has improved since Saturday. Patients daughter encouraged to call clinic if there are any needs or concerns prior to next scheduled visit.

## 2023-01-21 MED FILL — Dexamethasone Sodium Phosphate Inj 100 MG/10ML: INTRAMUSCULAR | Qty: 1 | Status: AC

## 2023-01-21 MED FILL — Fosaprepitant Dimeglumine For IV Infusion 150 MG (Base Eq): INTRAVENOUS | Qty: 5 | Status: AC

## 2023-01-22 ENCOUNTER — Inpatient Hospital Stay: Payer: PPO | Attending: Oncology

## 2023-01-22 ENCOUNTER — Inpatient Hospital Stay: Payer: PPO | Admitting: Oncology

## 2023-01-22 ENCOUNTER — Inpatient Hospital Stay: Payer: PPO

## 2023-01-22 DIAGNOSIS — D649 Anemia, unspecified: Secondary | ICD-10-CM | POA: Diagnosis not present

## 2023-01-22 DIAGNOSIS — C652 Malignant neoplasm of left renal pelvis: Secondary | ICD-10-CM | POA: Diagnosis not present

## 2023-01-22 DIAGNOSIS — C642 Malignant neoplasm of left kidney, except renal pelvis: Secondary | ICD-10-CM

## 2023-01-22 DIAGNOSIS — E876 Hypokalemia: Secondary | ICD-10-CM | POA: Insufficient documentation

## 2023-01-22 DIAGNOSIS — Z5111 Encounter for antineoplastic chemotherapy: Secondary | ICD-10-CM | POA: Insufficient documentation

## 2023-01-22 LAB — CMP (CANCER CENTER ONLY)
ALT: 13 U/L (ref 0–44)
AST: 20 U/L (ref 15–41)
Albumin: 4 g/dL (ref 3.5–5.0)
Alkaline Phosphatase: 53 U/L (ref 38–126)
Anion gap: 8 (ref 5–15)
BUN: 16 mg/dL (ref 8–23)
CO2: 23 mmol/L (ref 22–32)
Calcium: 9 mg/dL (ref 8.9–10.3)
Chloride: 105 mmol/L (ref 98–111)
Creatinine: 1.31 mg/dL — ABNORMAL HIGH (ref 0.44–1.00)
GFR, Estimated: 42 mL/min — ABNORMAL LOW (ref >60)
Glucose, Bld: 104 mg/dL — ABNORMAL HIGH (ref 70–99)
Potassium: 3.6 mmol/L (ref 3.5–5.1)
Sodium: 136 mmol/L (ref 135–145)
Total Bilirubin: 0.5 mg/dL (ref 0.3–1.2)
Total Protein: 6.5 g/dL (ref 6.5–8.1)

## 2023-01-22 LAB — CBC WITH DIFFERENTIAL (CANCER CENTER ONLY)
Abs Immature Granulocytes: 0.01 10*3/uL (ref 0.00–0.07)
Basophils Absolute: 0 10*3/uL (ref 0.0–0.1)
Basophils Relative: 1 %
Eosinophils Absolute: 0.1 10*3/uL (ref 0.0–0.5)
Eosinophils Relative: 1 %
HCT: 33.4 % — ABNORMAL LOW (ref 36.0–46.0)
Hemoglobin: 10.8 g/dL — ABNORMAL LOW (ref 12.0–15.0)
Immature Granulocytes: 0 %
Lymphocytes Relative: 24 %
Lymphs Abs: 1.1 10*3/uL (ref 0.7–4.0)
MCH: 27.8 pg (ref 26.0–34.0)
MCHC: 32.3 g/dL (ref 30.0–36.0)
MCV: 86.1 fL (ref 80.0–100.0)
Monocytes Absolute: 0.8 10*3/uL (ref 0.1–1.0)
Monocytes Relative: 17 %
Neutro Abs: 2.5 10*3/uL (ref 1.7–7.7)
Neutrophils Relative %: 57 %
Platelet Count: 159 10*3/uL (ref 150–400)
RBC: 3.88 MIL/uL (ref 3.87–5.11)
RDW: 16.3 % — ABNORMAL HIGH (ref 11.5–15.5)
WBC Count: 4.4 10*3/uL (ref 4.0–10.5)
nRBC: 0 % (ref 0.0–0.2)

## 2023-01-22 LAB — MAGNESIUM: Magnesium: 2.1 mg/dL (ref 1.7–2.4)

## 2023-01-22 MED ORDER — PALONOSETRON HCL INJECTION 0.25 MG/5ML
0.2500 mg | Freq: Once | INTRAVENOUS | Status: AC
  Administered 2023-01-22: 0.25 mg via INTRAVENOUS
  Filled 2023-01-22: qty 5

## 2023-01-22 MED ORDER — MAGNESIUM SULFATE 2 GM/50ML IV SOLN
2.0000 g | Freq: Once | INTRAVENOUS | Status: AC
  Administered 2023-01-22: 2 g via INTRAVENOUS
  Filled 2023-01-22: qty 50

## 2023-01-22 MED ORDER — SODIUM CHLORIDE 0.9 % IV SOLN
150.0000 mg | Freq: Once | INTRAVENOUS | Status: AC
  Administered 2023-01-22: 150 mg via INTRAVENOUS
  Filled 2023-01-22: qty 150

## 2023-01-22 MED ORDER — SODIUM CHLORIDE 0.9 % IV SOLN
10.0000 mg | Freq: Once | INTRAVENOUS | Status: AC
  Administered 2023-01-22: 10 mg via INTRAVENOUS
  Filled 2023-01-22: qty 10

## 2023-01-22 MED ORDER — HEPARIN SOD (PORK) LOCK FLUSH 100 UNIT/ML IV SOLN
500.0000 [IU] | Freq: Once | INTRAVENOUS | Status: AC | PRN
  Filled 2023-01-22: qty 5

## 2023-01-22 MED ORDER — SODIUM CHLORIDE 0.9 % IV SOLN
Freq: Once | INTRAVENOUS | Status: AC
  Filled 2023-01-22: qty 250

## 2023-01-22 MED ORDER — SODIUM CHLORIDE 0.9 % IV SOLN
1000.0000 mg/m2 | Freq: Once | INTRAVENOUS | Status: AC
  Administered 2023-01-22: 1406 mg via INTRAVENOUS
  Filled 2023-01-22: qty 10.53

## 2023-01-22 MED ORDER — SODIUM CHLORIDE 0.9 % IV SOLN
Freq: Once | INTRAVENOUS | Status: DC
  Filled 2023-01-22: qty 250

## 2023-01-22 MED ORDER — SODIUM CHLORIDE 0.9 % IV SOLN
70.0000 mg/m2 | Freq: Once | INTRAVENOUS | Status: AC
  Administered 2023-01-22: 100 mg via INTRAVENOUS
  Filled 2023-01-22: qty 8.16

## 2023-01-22 MED ORDER — POTASSIUM CHLORIDE IN NACL 20-0.9 MEQ/L-% IV SOLN
Freq: Once | INTRAVENOUS | Status: AC
  Filled 2023-01-22: qty 1000

## 2023-01-22 MED ORDER — HEPARIN SOD (PORK) LOCK FLUSH 100 UNIT/ML IV SOLN
INTRAVENOUS | Status: AC
  Administered 2023-01-22: 500 [IU]
  Filled 2023-01-22: qty 5

## 2023-01-22 NOTE — Progress Notes (Signed)
Per Dr. Orlie Dakin okay to run post hydration fluids and Cisplatin together.

## 2023-01-22 NOTE — Progress Notes (Signed)
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  INTERVAL HISTORY: Patient returns to clinic today for further evaluation and consideration of cycle 3, day 1 of cisplatin and gemcitabine.  Her appetite has improved and her weight is stable.  She does not complain of weakness or fatigue today.  She has no neurologic complaints.  She denies any recent fevers or illnesses. She has no chest pain, shortness of breath, cough, or hemoptysis.  She denies any nausea, vomiting, constipation, or diarrhea.  She has not no urinary complaints.  Patient offers no further specific complaints today.  REVIEW OF SYSTEMS:   Review of Systems  Constitutional: Negative.  Negative for fever, malaise/fatigue and weight loss.  Respiratory: Negative.  Negative for cough and shortness of breath.   Cardiovascular: Negative.  Negative for chest pain and leg swelling.  Gastrointestinal: Negative.  Negative for abdominal pain.  Genitourinary: Negative.  Negative for hematuria.  Musculoskeletal: Negative.  Negative for back pain.  Skin: Negative.  Negative for rash.  Neurological: Negative.  Negative for dizziness, focal weakness, weakness and headaches.  Psychiatric/Behavioral: Negative.  The patient is not nervous/anxious.     As per HPI. Otherwise, a complete review of systems is negative.  PAST MEDICAL HISTORY: Past Medical History:  Diagnosis Date   Back pain    Basal cell carcinoma 08/04/2020   0.5cm above the left mid brow, EDC 09/15/20   Cancer (HCC)    Melanoma   Curvature of spine    Dysplastic nevus 04/30/2013   left post shoulder, left parasternal   Dysplastic nevus 05/04/2014   right prox ant deltoid, left lat buttock   Dysplastic nevus 03/26/2019   right lat neck  adjacent to the inf top of MM in situ site scar   Dysplastic nevus 10/29/2019   upper back left paraspinal at level of mid scapula   Dysplastic nevus 03/31/2020, 05/12/20 shave removal   Left mid pretibial. Moderate to severe atypia, deep margin involved.   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   right lat neck inf to right angle of mandible, excised: 07/29/2018, margins free   Nephrolithiasis    Osteopenia    Osteoporosis    Pelvic pain in female    Raynaud disease    Thyromegaly     PAST SURGICAL HISTORY: Past Surgical History:  Procedure Laterality Date   ABDOMINAL HYSTERECTOMY     BILATERAL OOPHORECTOMY  1999   CESAREAN SECTION     COLONOSCOPY WITH PROPOFOL N/A 05/09/2015   Procedure: COLONOSCOPY WITH PROPOFOL;  Surgeon: Elnita Maxwell, MD;  Location: [facility] END[facility]COPY;  Service: Endoscopy;  Laterality: N/A;   CYSTOSCOPY WITH RETROGRADE PYELOGRAM, URETEROSCOPY AND STENT PLACEMENT Left 10/09/2022   Procedure: CYSTOSCOPY WITH RETROGRADE PYELOGRAM, URETEROSCOPY AND STENT PLACEMENT;  Surgeon: Riki Altes, MD;  Location: [facility] ORS;  Service: Urology;  Laterality: Left;   IR IMAGING GUIDED PORT INSERTION  12/06/2022   MELANOMA  EXCISION Right 07/29/2018   lateral neck inf. to right angle of mandible   OOPHORECTOMY     URETERAL BIOPSY Left 10/09/2022   Procedure: URETERAL BIOPSY;  Surgeon: Riki AltesStoioff, Scott C, MD;  Location: [facility] ORS;  Service: Urology;  Laterality: Left;    FAMILY HISTORY: Family History  Problem Relation Age of Onset   Emphysema Father    Cancer Neg Hx    Heart disease Neg Hx    Diabetes Neg Hx    Breast cancer Neg Hx     ADVANCED DIRECTIVES  (Y/N):  N  HEALTH MAINTENANCE: Social History   Tobacco Use   Smoking status: Never    Passive exposure: Never   Smokeless tobacco: Never  Vaping Use   Vaping Use: Never used  Substance Use Topics   Alcohol use: No   Drug use: No     Colonoscopy:  PAP:  Bone density:  Lipid panel:  Allergies  Allergen Reactions   Codeine Nausea And Vomiting   Neosporin Wound Cleanser [Benzalkonium Chloride] Other (See Comments)    Skin blisters   Penicillins     Current Outpatient Medications  Medication Sig Dispense Refill   acetaminophen (TYLENOL) 325 MG tablet Take 650 mg by mouth every 6 (six) hours as needed.     calcium-vitamin D (OSCAL WITH D) 500-200 MG-UNIT tablet 1 tablet daily with breakfast.     Cholecalciferol 25 MCG (1000 UT) tablet Take 1,000 Units by mouth daily.     cyanocobalamin 1000 MCG tablet Take 1,000 mcg by mouth daily.     dexamethasone (DECADRON) 4 MG tablet Take 1 tablet (4 mg total) by mouth daily. 30 tablet 2   donepezil (ARICEPT) 10 MG tablet TAKE 1 TABLET BY MOUTH ONCE EVERY EVENING     lidocaine-prilocaine (EMLA) cream Apply to affected area once 30 g 3   memantine (NAMENDA) 5 MG tablet Take 5 mg by mouth 2 (two) times daily.     omeprazole (PRILOSEC) 40 MG capsule 40 mg daily.     ondansetron (ZOFRAN) 8 MG tablet Take 1 tablet (8 mg total) by mouth every 8 (eight) hours as needed for nausea or vomiting. Start on the third day after cisplatin. 60 tablet 2   prochlorperazine (COMPAZINE) 10 MG tablet Take 1 tablet (10 mg total) by mouth every 6 (six) hours as needed (Nausea or vomiting). 60 tablet 2   raloxifene (EVISTA) 60 MG tablet TAKE 1 TABLET BY MOUTH ONCE A DAY 90 tablet 1   rosuvastatin (CRESTOR) 5 MG tablet Take 5 mg by mouth daily.     meloxicam (MOBIC) 7.5 MG tablet Take 1 tablet by mouth daily. (Patient not taking: Reported on 01/08/2023)     No current facility-administered medications for this visit.   Facility-Administered Medications Ordered  in Other Visits  Medication Dose Route Frequency Provider Last Rate Last Admin   0.9 %  sodium chloride infusion   Intravenous Once Orlie DakinFinnegan, Tollie Pizzaimothy J, MD       CISplatin (PLATINOL) 100 mg in sodium chloride 0.9 % 500 mL chemo infusion  70 mg/m2 (Treatment Plan Recorded) Intravenous Once Jeralyn RuthsFinnegan, Alan Riles J, MD       dexamethasone (DECADRON) 10 mg in sodium chloride 0.9 % 50 mL IVPB  10 mg Intravenous Once Jeralyn RuthsFinnegan, Leida Luton J, MD       fosaprepitant (EMEND) 150 mg in sodium chloride 0.9 % 145 mL IVPB  150 mg Intravenous Once Jeralyn RuthsFinnegan, Meeka Cartelli J, MD       gemcitabine [facility](GEMZAR) [phone]  mg in sodium chloride 0.9 % 250 mL chemo infusion  1,000 mg/m2 (Treatment Plan Recorded) Intravenous Once Jeralyn Ruths, MD       heparin lock flush 100 UNIT/ML injection            heparin lock flush 100 unit/mL  500 Units Intracatheter Once PRN Jeralyn Ruths, MD       magnesium sulfate IVPB 2 g 50 mL  2 g Intravenous Once Jeralyn Ruths, MD 50 mL/hr at 01/22/23 0911 2 g at 01/22/23 0911   palonosetron (ALOXI) injection 0.25 mg  0.25 mg Intravenous Once Jeralyn Ruths, MD        OBJECTIVE: Vitals:   01/22/23 0844  BP: 136/66  Pulse: 80  Temp: 97.8 F (36.6 C)     Body mass index is 22.26 kg/m.    ECOG FS:1 - Symptomatic but completely ambulatory  General: Well-developed, well-nourished, no acute distress. Eyes: Pink conjunctiva, anicteric sclera. HEENT: Normocephalic, moist mucous membranes. Lungs: No audible wheezing or coughing. Heart: Regular rate and rhythm. Abdomen: Soft, nontender, no obvious distention. Musculoskeletal: No edema, cyanosis, or clubbing. Neuro: Alert, answering all questions appropriately. Cranial nerves grossly intact. Skin: No rashes or petechiae noted. Psych: Normal affect.  LAB RESULTS:  Lab Results  Component Value Date   NA 136 01/22/2023   K 3.6 01/22/2023   CL 105 01/22/2023   CO2 23 01/22/2023   GLUCOSE 104 (H) 01/22/2023   BUN 16 01/22/2023    CREATININE 1.31 (H) 01/22/2023   CALCIUM 9.0 01/22/2023   PROT 6.5 01/22/2023   ALBUMIN 4.0 01/22/2023   AST 20 01/22/2023   ALT 13 01/22/2023   ALKPHOS 53 01/22/2023   BILITOT 0.5 01/22/2023   GFRNONAA 42 (L) 01/22/2023    Lab Results  Component Value Date   WBC 4.4 01/22/2023   NEUTROABS 2.5 01/22/2023   HGB 10.8 (L) 01/22/2023   HCT 33.4 (L) 01/22/2023   MCV 86.1 01/22/2023   PLT 159 01/22/2023     STUDIES: MR BRAIN WO CONTRAST  Result Date: 01/13/2023 CLINICAL DATA:  Headache disorder EXAM: MRI HEAD WITHOUT CONTRAST TECHNIQUE: Multiplanar, multiecho pulse sequences of the brain and surrounding structures were obtained without intravenous contrast. COMPARISON:  09/05/2020 FINDINGS: Brain: No acute or subacute infarction, hemorrhage, hydrocephalus, extra-axial collection or mass lesion. Numerous microhemorrhages along the mainly posterior cerebral convexities, asymmetrically prominent in the right occipital lobe. No associated significant ischemic gliosis. Cerebral volume loss that is generalized. Vascular: Major flow voids are preserved. Arachnoid granulations along the distal left transverse sinus, stable. Skull and upper cervical spine: Normal marrow signal. Sinuses/Orbits: Negative IMPRESSION: 1. Stable compared to 2021.  No specific cause for headache. 2. Chronic microhemorrhages accentuated along the right occipital convexity, possibly posttraumatic. No interval progression or associated ischemic gliosis. Electronically Signed   By: Tiburcio Pea M.D.   On: 01/13/2023 06:54    ASSESSMENT: Urothelial carcinoma of the kidney.  PLAN:    Urothelial carcinoma of the kidney: Confirmed by biopsy.  PET scan results on November 14, 2022 reviewed independently with no significant hypermetabolism of known malignancy.  Imaging does not reveal any metastatic disease.  After consultation with Dr. Apolinar Junes, it is recommended that patient pursue neoadjuvant chemotherapy followed by surgical  resection.  Patient will receive cisplatin and gemcitabine on day 1 with gemcitabine on day 8 and a 21-day cycle.  Plan to give 4 cycles and then refer back to urology for surgical resection.  Proceed with cycle 3, day 1  of treatment today.  Return to clinic in 1 week for further evaluation and consideration of cycle 3, day 8.   Hematuria: Patient does not complain of this today.  Secondary to malignancy. Renal insufficiency: Creatinine slightly increased to 1.31 today.  Gemcitabine does not need to be dose reduced with renal insufficiency.  Continue to monitor closely while giving cisplatin.   Anemia: Chronic and unchanged.  Patient's hemoglobin is 10.8.   Poor appetite: Improved, continue dexamethasone.  Patient expressed understanding and was in agreement with this plan. She also understands that She can call clinic at any time with any questions, concerns, or complaints.    Cancer Staging  Urothelial carcinoma of kidney Staging form: Kidney, AJCC 8th Edition - Clinical stage from 12/01/2022: Stage Unknown (cTX, cN0, cM0) - Signed by Jeralyn Ruths, MD on 12/01/2022 Stage prefix: Initial diagnosis Histologic grade (G): G3 Histologic grading system: 4 grade system   Jeralyn Ruths, MD   01/22/2023 9:15 AM

## 2023-01-22 NOTE — Patient Instructions (Signed)
[facility] [facility]  Discharge Instructions: Thank you for choosing [facility] to provide your oncology and hematology care.  If you have a lab appointment with the Cancer Center, please go directly to the Cancer Center and check in at the registration area.  Wear comfortable clothing and clothing appropriate for easy access to any Portacath or PICC line.   We strive to give you quality time with your provider. You may need to reschedule your appointment if you arrive late (15 or more minutes).  Arriving late affects you and other patients whose appointments are after yours.  Also, if you miss three or more appointments without notifying the office, you may be dismissed from the clinic at the provider's discretion.      For prescription refill requests, have your pharmacy contact our office and allow 72 hours for refills to be completed.    Today you received the following chemotherapy and/or immunotherapy agents Gemzar and Cisplatin       To help prevent nausea and vomiting after your treatment, we encourage you to take your nausea medication as directed.  BELOW ARE SYMPTOMS THAT SHOULD BE REPORTED IMMEDIATELY: *FEVER GREATER THAN 100.4 F (38 C) OR HIGHER *CHILLS OR SWEATING *NAUSEA AND VOMITING THAT IS NOT CONTROLLED WITH YOUR NAUSEA MEDICATION *UNUSUAL SHORTNESS OF BREATH *UNUSUAL BRUISING OR BLEEDING *URINARY PROBLEMS (pain or burning when urinating, or frequent urination) *BOWEL PROBLEMS (unusual diarrhea, constipation, pain near the anus) TENDERNESS IN MOUTH AND THROAT WITH OR WITHOUT PRESENCE OF ULCERS (sore throat, sores in mouth, or a toothache) UNUSUAL RASH, SWELLING OR PAIN  UNUSUAL VAGINAL DISCHARGE OR ITCHING   Items with * indicate a potential emergency and should be followed up as soon as possible or go to the Emergency Department if any problems should occur.  Please show the CHEMOTHERAPY ALERT CARD or IMMUNOTHERAPY ALERT CARD at  check-in to the Emergency Department and triage nurse.  Should you have questions after your visit or need to cancel or reschedule your appointment, please contact [place] [facility]  [phone] and follow the prompts.  Office hours are 8:00 a.m. to 4:30 p.m. Monday - Friday. Please note that voicemails left after 4:00 p.m. may not be returned until the following business day.  We are closed weekends and major holidays. You have access to a nurse at all times for urgent questions. Please call the main number to the clinic [phone] and follow the prompts.  For any non-urgent questions, you may also contact your provider using MyChart. We now offer e-Visits for anyone 31 and older to request care online for non-urgent symptoms. For details visit [email or web address].   Also download the MyChart app! Go to the app store, search "MyChart", open the app, select [place], and log in with your MyChart username and password.

## 2023-01-29 ENCOUNTER — Inpatient Hospital Stay: Payer: PPO | Admitting: Oncology

## 2023-01-29 ENCOUNTER — Encounter: Payer: Self-pay | Admitting: Oncology

## 2023-01-29 ENCOUNTER — Inpatient Hospital Stay: Payer: PPO

## 2023-01-29 DIAGNOSIS — C642 Malignant neoplasm of left kidney, except renal pelvis: Secondary | ICD-10-CM

## 2023-01-29 DIAGNOSIS — Z5111 Encounter for antineoplastic chemotherapy: Secondary | ICD-10-CM | POA: Diagnosis not present

## 2023-01-29 LAB — CMP (CANCER CENTER ONLY)
ALT: 22 U/L (ref 0–44)
AST: 37 U/L (ref 15–41)
Albumin: 4 g/dL (ref 3.5–5.0)
Alkaline Phosphatase: 52 U/L (ref 38–126)
Anion gap: 11 (ref 5–15)
BUN: 28 mg/dL — ABNORMAL HIGH (ref 8–23)
CO2: 23 mmol/L (ref 22–32)
Calcium: 8.9 mg/dL (ref 8.9–10.3)
Chloride: 101 mmol/L (ref 98–111)
Creatinine: 2.02 mg/dL — ABNORMAL HIGH (ref 0.44–1.00)
GFR, Estimated: 25 mL/min — ABNORMAL LOW (ref >60)
Glucose, Bld: 166 mg/dL — ABNORMAL HIGH (ref 70–99)
Potassium: 3.3 mmol/L — ABNORMAL LOW (ref 3.5–5.1)
Sodium: 135 mmol/L (ref 135–145)
Total Bilirubin: 0.3 mg/dL (ref 0.3–1.2)
Total Protein: 6.6 g/dL (ref 6.5–8.1)

## 2023-01-29 LAB — CBC WITH DIFFERENTIAL (CANCER CENTER ONLY)
Abs Immature Granulocytes: 0.04 10*3/uL (ref 0.00–0.07)
Basophils Absolute: 0 10*3/uL (ref 0.0–0.1)
Basophils Relative: 0 %
Eosinophils Absolute: 0 10*3/uL (ref 0.0–0.5)
Eosinophils Relative: 0 %
HCT: 31.7 % — ABNORMAL LOW (ref 36.0–46.0)
Hemoglobin: 10.6 g/dL — ABNORMAL LOW (ref 12.0–15.0)
Immature Granulocytes: 1 %
Lymphocytes Relative: 12 %
Lymphs Abs: 0.7 10*3/uL (ref 0.7–4.0)
MCH: 27.7 pg (ref 26.0–34.0)
MCHC: 33.4 g/dL (ref 30.0–36.0)
MCV: 83 fL (ref 80.0–100.0)
Monocytes Absolute: 0.1 10*3/uL (ref 0.1–1.0)
Monocytes Relative: 2 %
Neutro Abs: 5.1 10*3/uL (ref 1.7–7.7)
Neutrophils Relative %: 85 %
Platelet Count: 105 10*3/uL — ABNORMAL LOW (ref 150–400)
RBC: 3.82 MIL/uL — ABNORMAL LOW (ref 3.87–5.11)
RDW: 15.9 % — ABNORMAL HIGH (ref 11.5–15.5)
WBC Count: 6 10*3/uL (ref 4.0–10.5)
nRBC: 0 % (ref 0.0–0.2)

## 2023-01-29 MED ORDER — SODIUM CHLORIDE 0.9 % IV SOLN
Freq: Once | INTRAVENOUS | Status: AC
  Filled 2023-01-29: qty 250

## 2023-01-29 MED ORDER — HEPARIN SOD (PORK) LOCK FLUSH 100 UNIT/ML IV SOLN
500.0000 [IU] | Freq: Once | INTRAVENOUS | Status: DC | PRN
  Filled 2023-01-29: qty 5

## 2023-01-29 MED ORDER — SODIUM CHLORIDE 0.9 % IV SOLN
1000.0000 mg/m2 | Freq: Once | INTRAVENOUS | Status: AC
  Administered 2023-01-29: 1406 mg via INTRAVENOUS
  Filled 2023-01-29: qty 26.28

## 2023-01-29 MED ORDER — PROCHLORPERAZINE MALEATE 10 MG PO TABS
10.0000 mg | ORAL_TABLET | Freq: Once | ORAL | Status: AC
  Administered 2023-01-29: 10 mg via ORAL
  Filled 2023-01-29: qty 1

## 2023-01-29 NOTE — Patient Instructions (Signed)
[facility] [place] [facility]  Discharge Instructions: Thank you for choosing [facility] to provide your oncology and hematology care.  If you have a lab appointment with the Cancer Center, please go directly to the Cancer Center and check in at the registration area.  Wear comfortable clothing and clothing appropriate for easy access to any Portacath or PICC line.   We strive to give you quality time with your provider. You may need to reschedule your appointment if you arrive late (15 or more minutes).  Arriving late affects you and other patients whose appointments are after yours.  Also, if you miss three or more appointments without notifying the office, you may be dismissed from the clinic at the provider's discretion.      For prescription refill requests, have your pharmacy contact our office and allow 72 hours for refills to be completed.    Today you received the following chemotherapy and/or immunotherapy agents gemzar    To help prevent nausea and vomiting after your treatment, we encourage you to take your nausea medication as directed.  BELOW ARE SYMPTOMS THAT SHOULD BE REPORTED IMMEDIATELY: *FEVER GREATER THAN 100.4 F (38 C) OR HIGHER *CHILLS OR SWEATING *NAUSEA AND VOMITING THAT IS NOT CONTROLLED WITH YOUR NAUSEA MEDICATION *UNUSUAL SHORTNESS OF BREATH *UNUSUAL BRUISING OR BLEEDING *URINARY PROBLEMS (pain or burning when urinating, or frequent urination) *BOWEL PROBLEMS (unusual diarrhea, constipation, pain near the anus) TENDERNESS IN MOUTH AND THROAT WITH OR WITHOUT PRESENCE OF ULCERS (sore throat, sores in mouth, or a toothache) UNUSUAL RASH, SWELLING OR PAIN  UNUSUAL VAGINAL DISCHARGE OR ITCHING   Items with * indicate a potential emergency and should be followed up as soon as possible or go to the Emergency Department if any problems should occur.  Please show the CHEMOTHERAPY ALERT CARD or IMMUNOTHERAPY ALERT CARD at check-in to the  Emergency Department and triage nurse.  Should you have questions after your visit or need to cancel or reschedule your appointment, please contact [place] [facility] [place] [facility]  [phone] and follow the prompts.  Office hours are 8:00 a.m. to 4:30 p.m. Monday - Friday. Please note that voicemails left after 4:00 p.m. may not be returned until the following business day.  We are closed weekends and major holidays. You have access to a nurse at all times for urgent questions. Please call the main number to the clinic [phone] and follow the prompts.  For any non-urgent questions, you may also contact your provider using MyChart. We now offer e-Visits for anyone 18 and older to request care online for non-urgent symptoms. For details visit [email or web address].   Also download the MyChart app! Go to the app store, search "MyChart", open the app, select [place], and log in with your MyChart username and password.   

## 2023-01-29 NOTE — Progress Notes (Signed)
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  INTERVAL HISTORY: Patient returns to clinic today for further evaluation and consideration of cycle 3, day 8 of cisplatin and gemcitabine.  Gemcitabine only today.  She has increased weakness and fatigue and a poor appetite.  She has no neurologic complaints.  She denies any recent fevers or illnesses. She has no chest pain, shortness of breath, cough, or hemoptysis.  She denies any nausea, vomiting, constipation, or diarrhea.  She has not no urinary complaints.  Patient offers no further specific complaints today.    REVIEW OF SYSTEMS:   Review of Systems  Constitutional:  Positive for malaise/fatigue. Negative for fever and weight loss.  Respiratory: Negative.  Negative for cough and shortness of breath.   Cardiovascular: Negative.  Negative for chest pain and leg swelling.  Gastrointestinal: Negative.  Negative for abdominal pain.  Genitourinary: Negative.  Negative for hematuria.  Musculoskeletal: Negative.  Negative for back pain.  Skin: Negative.  Negative for rash.  Neurological:  Positive for weakness. Negative for dizziness, focal weakness and headaches.  Psychiatric/Behavioral: Negative.  The patient is not nervous/anxious.     As per HPI. Otherwise, a complete review of systems is negative.  PAST MEDICAL HISTORY: Past Medical History:  Diagnosis Date   Back pain    Basal cell carcinoma 08/04/2020   0.5cm above the left mid brow, EDC 09/15/20   Cancer    Melanoma   Curvature of spine    Dysplastic nevus 04/30/2013   left post shoulder, left parasternal   Dysplastic nevus 05/04/2014   right prox ant deltoid, left lat buttock   Dysplastic nevus 03/26/2019   right lat neck adjacent to the inf  top of MM in situ site scar   Dysplastic nevus 10/29/2019   upper back left paraspinal at level of mid scapula   Dysplastic nevus 03/31/2020, 05/12/20 shave removal   Left mid pretibial. Moderate to severe atypia, deep margin involved.   Dysplastic nevus 06/29/2021   L ant deltoid, moderat to severe atypia, excised 08/15/21   Endometriosis    Endometriosis    Fibrocystic disease of breast    Fibroid    GERD (gastroesophageal reflux disease)    History of kidney stones    HLD (hyperlipidemia)    Hyperlipemia    Hypertension    Kidney stone    Melanoma in situ 07/03/2018   right lat neck inf to right angle of mandible, excised: 07/29/2018, margins free   Nephrolithiasis    Osteopenia    Osteoporosis    Pelvic pain in female    Raynaud disease    Thyromegaly     PAST SURGICAL HISTORY: Past Surgical History:  Procedure Laterality Date   ABDOMINAL HYSTERECTOMY     BILATERAL OOPHORECTOMY  1999   CESAREAN SECTION     COLONOSCOPY WITH PROPOFOL N/A 05/09/2015   Procedure: COLONOSCOPY WITH PROPOFOL;  Surgeon: Elnita Maxwell, MD;  Location: [facility] ENDOSCOPY;  Service: Endoscopy;  Laterality: N/A;   CYSTOSCOPY WITH RETROGRADE PYELOGRAM, URETEROSCOPY AND STENT PLACEMENT Left 10/09/2022   Procedure: CYSTOSCOPY WITH RETROGRADE PYELOGRAM, URETEROSCOPY AND STENT PLACEMENT;  Surgeon: Riki Altes, MD;  Location: [facility] ORS;  Service: Urology;  Laterality: Left;   IR IMAGING GUIDED PORT INSERTION  12/06/2022   MELANOMA EXCISION Right 07/29/2018  lateral neck inf. to right angle of mandible   OOPHORECTOMY     URETERAL BIOPSY Left 10/09/2022   Procedure: URETERAL BIOPSY;  Surgeon: Riki Altes, MD;  Location: [facility] ORS;  Service: Urology;  Laterality: Left;    FAMILY HISTORY: Family History  Problem Relation Age of Onset   Emphysema Father    Cancer Neg Hx    Heart disease Neg Hx    Diabetes Neg Hx    Breast cancer Neg Hx     ADVANCED DIRECTIVES (Y/N):  N  HEALTH  MAINTENANCE: Social History   Tobacco Use   Smoking status: Never    Passive exposure: Never   Smokeless tobacco: Never  Vaping Use   Vaping Use: Never used  Substance Use Topics   Alcohol use: No   Drug use: No     Colonoscopy:  PAP:  Bone density:  Lipid panel:  Allergies  Allergen Reactions   Codeine Nausea And Vomiting   Neosporin Wound Cleanser [Benzalkonium Chloride] Other (See Comments)    Skin blisters   Penicillins     Current Outpatient Medications  Medication Sig Dispense Refill   acetaminophen (TYLENOL) 325 MG tablet Take 650 mg by mouth every 6 (six) hours as needed.     calcium-vitamin D (OSCAL WITH D) 500-200 MG-UNIT tablet 1 tablet daily with breakfast.     Cholecalciferol 25 MCG (1000 UT) tablet Take 1,000 Units by mouth daily.     cyanocobalamin 1000 MCG tablet Take 1,000 mcg by mouth daily.     dexamethasone (DECADRON) 4 MG tablet Take 1 tablet (4 mg total) by mouth daily. 30 tablet 2   donepezil (ARICEPT) 10 MG tablet TAKE 1 TABLET BY MOUTH ONCE EVERY EVENING     lidocaine-prilocaine (EMLA) cream Apply to affected area once 30 g 3   memantine (NAMENDA) 5 MG tablet Take 5 mg by mouth 2 (two) times daily.     omeprazole (PRILOSEC) 40 MG capsule 40 mg daily.     ondansetron (ZOFRAN) 8 MG tablet Take 1 tablet (8 mg total) by mouth every 8 (eight) hours as needed for nausea or vomiting. Start on the third day after cisplatin. 60 tablet 2   prochlorperazine (COMPAZINE) 10 MG tablet Take 1 tablet (10 mg total) by mouth every 6 (six) hours as needed (Nausea or vomiting). 60 tablet 2   raloxifene (EVISTA) 60 MG tablet TAKE 1 TABLET BY MOUTH ONCE A DAY 90 tablet 1   rosuvastatin (CRESTOR) 5 MG tablet Take 5 mg by mouth daily.     meloxicam (MOBIC) 7.5 MG tablet Take 1 tablet by mouth daily. (Patient not taking: Reported on 01/08/2023)     No current facility-administered medications for this visit.   Facility-Administered Medications Ordered in Other Visits   Medication Dose Route Frequency Provider Last Rate Last Admin   0.9 %  sodium chloride infusion   Intravenous Once Orlie Dakin, Tollie Pizza, MD       0.9 %  sodium chloride infusion   Intravenous Once Jeralyn Ruths, MD       gemcitabine (GEMZAR) 1,406 mg in sodium chloride 0.9 % 250 mL chemo infusion  1,000 mg/m2 (Treatment Plan Recorded) Intravenous Once Jeralyn Ruths, MD       heparin lock flush 100 unit/mL  500 Units Intracatheter Once PRN Jeralyn Ruths, MD       prochlorperazine (COMPAZINE) tablet 10 mg  10 mg Oral Once Jeralyn Ruths, MD  OBJECTIVE: Vitals:   01/29/23 1348  BP: 114/66  Pulse: 77  Resp: 16  Temp: 98.6 F (37 C)  SpO2: 99%     Body mass index is 21.8 kg/m.    ECOG FS:1 - Symptomatic but completely ambulatory  General: Well-developed, well-nourished, no acute distress. Eyes: Pink conjunctiva, anicteric sclera. HEENT: Normocephalic, moist mucous membranes. Lungs: No audible wheezing or coughing. Heart: Regular rate and rhythm. Abdomen: Soft, nontender, no obvious distention. Musculoskeletal: No edema, cyanosis, or clubbing. Neuro: Alert, answering all questions appropriately. Cranial nerves grossly intact. Skin: No rashes or petechiae noted. Psych: Normal affect.  LAB RESULTS:  Lab Results  Component Value Date   NA 135 01/29/2023   K 3.3 (L) 01/29/2023   CL 101 01/29/2023   CO2 23 01/29/2023   GLUCOSE 166 (H) 01/29/2023   BUN 28 (H) 01/29/2023   CREATININE 2.02 (H) 01/29/2023   CALCIUM 8.9 01/29/2023   PROT 6.6 01/29/2023   ALBUMIN 4.0 01/29/2023   AST 37 01/29/2023   ALT 22 01/29/2023   ALKPHOS 52 01/29/2023   BILITOT 0.3 01/29/2023   GFRNONAA 25 (L) 01/29/2023    Lab Results  Component Value Date   WBC 6.0 01/29/2023   NEUTROABS 5.1 01/29/2023   HGB 10.6 (L) 01/29/2023   HCT 31.7 (L) 01/29/2023   MCV 83.0 01/29/2023   PLT 105 (L) 01/29/2023     STUDIES: MR BRAIN WO CONTRAST  Result Date:  01/13/2023 CLINICAL DATA:  Headache disorder EXAM: MRI HEAD WITHOUT CONTRAST TECHNIQUE: Multiplanar, multiecho pulse sequences of the brain and surrounding structures were obtained without intravenous contrast. COMPARISON:  09/05/2020 FINDINGS: Brain: No acute or subacute infarction, hemorrhage, hydrocephalus, extra-axial collection or mass lesion. Numerous microhemorrhages along the mainly posterior cerebral convexities, asymmetrically prominent in the right occipital lobe. No associated significant ischemic gliosis. Cerebral volume loss that is generalized. Vascular: Major flow voids are preserved. Arachnoid granulations along the distal left transverse sinus, stable. Skull and upper cervical spine: Normal marrow signal. Sinuses/Orbits: Negative IMPRESSION: 1. Stable compared to 2021.  No specific cause for headache. 2. Chronic microhemorrhages accentuated along the right occipital convexity, possibly posttraumatic. No interval progression or associated ischemic gliosis. Electronically Signed   By: Tiburcio Pea M.D.   On: 01/13/2023 06:54    ASSESSMENT: Urothelial carcinoma of the kidney.  PLAN:    Urothelial carcinoma of the kidney: Confirmed by biopsy.  PET scan results on November 14, 2022 reviewed independently with no significant hypermetabolism of known malignancy.  Imaging does not reveal any metastatic disease.  After consultation with Dr. Apolinar Junes, it is recommended that patient pursue neoadjuvant chemotherapy followed by surgical resection.  Patient will receive cisplatin and gemcitabine on day 1 with gemcitabine on day 8 and a 21-day cycle.  Plan to give 4 cycles and then refer back to urology for surgical resection.  Proceed with cycle 3, day 8 of treatment today.  Gemcitabine only.  Return to clinic in 2 weeks for further evaluation and consideration of cycle 4, day 1.   Hematuria: Patient does not complain of this today.  Secondary to malignancy. Renal insufficiency: Creatinine has increased  to 2.02.  Gemcitabine does not need to be dose reduced with renal insufficiency.  Continue to monitor closely while giving cisplatin.  Patient will receive an additional liter of IV fluids today and then return to clinic later this week for a second liter.   Anemia: Chronic and unchanged.  Patient's hemoglobin is 10.6. Thrombocytopenia: Patient's platelet count is 105.  Proceed  cautiously with treatment as above. Poor appetite: Improved, continue dexamethasone.  Patient expressed understanding and was in agreement with this plan. She also understands that She can call clinic at any time with any questions, concerns, or complaints.    Cancer Staging  Urothelial carcinoma of kidney Staging form: Kidney, AJCC 8th Edition - Clinical stage from 12/01/2022: Stage Unknown (cTX, cN0, cM0) - Signed by Jeralyn Ruths, MD on 12/01/2022 Stage prefix: Initial diagnosis Histologic grade (G): G3 Histologic grading system: 4 grade system   Jeralyn Ruths, MD   01/29/2023 2:24 PM

## 2023-01-31 ENCOUNTER — Inpatient Hospital Stay: Payer: PPO

## 2023-01-31 VITALS — BP 174/69 | HR 47 | Temp 98.3°F | Resp 20

## 2023-01-31 DIAGNOSIS — C642 Malignant neoplasm of left kidney, except renal pelvis: Secondary | ICD-10-CM

## 2023-01-31 DIAGNOSIS — Z5111 Encounter for antineoplastic chemotherapy: Secondary | ICD-10-CM | POA: Diagnosis not present

## 2023-01-31 MED ORDER — SODIUM CHLORIDE 0.9 % IV SOLN
Freq: Once | INTRAVENOUS | Status: AC
  Filled 2023-01-31: qty 250

## 2023-01-31 MED ORDER — HEPARIN SOD (PORK) LOCK FLUSH 100 UNIT/ML IV SOLN
500.0000 [IU] | Freq: Once | INTRAVENOUS | Status: AC | PRN
  Administered 2023-01-31: 500 [IU]
  Filled 2023-01-31: qty 5

## 2023-01-31 MED ORDER — SODIUM CHLORIDE 0.9% FLUSH
10.0000 mL | Freq: Once | INTRAVENOUS | Status: AC | PRN
  Administered 2023-01-31: 10 mL
  Filled 2023-01-31: qty 10

## 2023-02-04 DIAGNOSIS — N1832 Chronic kidney disease, stage 3b: Secondary | ICD-10-CM | POA: Diagnosis not present

## 2023-02-04 DIAGNOSIS — E538 Deficiency of other specified B group vitamins: Secondary | ICD-10-CM | POA: Diagnosis not present

## 2023-02-04 DIAGNOSIS — R7309 Other abnormal glucose: Secondary | ICD-10-CM | POA: Diagnosis not present

## 2023-02-04 DIAGNOSIS — Z0000 Encounter for general adult medical examination without abnormal findings: Secondary | ICD-10-CM | POA: Diagnosis not present

## 2023-02-04 DIAGNOSIS — R7303 Prediabetes: Secondary | ICD-10-CM | POA: Diagnosis not present

## 2023-02-04 DIAGNOSIS — I10 Essential (primary) hypertension: Secondary | ICD-10-CM | POA: Diagnosis not present

## 2023-02-04 DIAGNOSIS — E782 Mixed hyperlipidemia: Secondary | ICD-10-CM | POA: Diagnosis not present

## 2023-02-04 DIAGNOSIS — R413 Other amnesia: Secondary | ICD-10-CM | POA: Diagnosis not present

## 2023-02-11 MED FILL — Dexamethasone Sodium Phosphate Inj 100 MG/10ML: INTRAMUSCULAR | Qty: 1 | Status: AC

## 2023-02-11 MED FILL — Fosaprepitant Dimeglumine For IV Infusion 150 MG (Base Eq): INTRAVENOUS | Qty: 5 | Status: AC

## 2023-02-12 ENCOUNTER — Inpatient Hospital Stay: Payer: PPO

## 2023-02-12 ENCOUNTER — Inpatient Hospital Stay: Payer: PPO | Admitting: Oncology

## 2023-02-12 ENCOUNTER — Encounter: Payer: Self-pay | Admitting: Oncology

## 2023-02-12 DIAGNOSIS — C642 Malignant neoplasm of left kidney, except renal pelvis: Secondary | ICD-10-CM

## 2023-02-12 DIAGNOSIS — Z5111 Encounter for antineoplastic chemotherapy: Secondary | ICD-10-CM | POA: Diagnosis not present

## 2023-02-12 LAB — CBC WITH DIFFERENTIAL (CANCER CENTER ONLY)
Abs Immature Granulocytes: 0.2 10*3/uL — ABNORMAL HIGH (ref 0.00–0.07)
Basophils Absolute: 0 10*3/uL (ref 0.0–0.1)
Basophils Relative: 0 %
Eosinophils Absolute: 0 10*3/uL (ref 0.0–0.5)
Eosinophils Relative: 0 %
HCT: 30.2 % — ABNORMAL LOW (ref 36.0–46.0)
Hemoglobin: 10 g/dL — ABNORMAL LOW (ref 12.0–15.0)
Immature Granulocytes: 3 %
Lymphocytes Relative: 14 %
Lymphs Abs: 1 10*3/uL (ref 0.7–4.0)
MCH: 28.3 pg (ref 26.0–34.0)
MCHC: 33.1 g/dL (ref 30.0–36.0)
MCV: 85.6 fL (ref 80.0–100.0)
Monocytes Absolute: 1.2 10*3/uL — ABNORMAL HIGH (ref 0.1–1.0)
Monocytes Relative: 16 %
Neutro Abs: 4.9 10*3/uL (ref 1.7–7.7)
Neutrophils Relative %: 67 %
Platelet Count: 211 10*3/uL (ref 150–400)
RBC: 3.53 MIL/uL — ABNORMAL LOW (ref 3.87–5.11)
RDW: 18.6 % — ABNORMAL HIGH (ref 11.5–15.5)
WBC Count: 7.3 10*3/uL (ref 4.0–10.5)
nRBC: 0 % (ref 0.0–0.2)

## 2023-02-12 LAB — CMP (CANCER CENTER ONLY)
ALT: 15 U/L (ref 0–44)
AST: 23 U/L (ref 15–41)
Albumin: 3.7 g/dL (ref 3.5–5.0)
Alkaline Phosphatase: 40 U/L (ref 38–126)
Anion gap: 9 (ref 5–15)
BUN: 35 mg/dL — ABNORMAL HIGH (ref 8–23)
CO2: 25 mmol/L (ref 22–32)
Calcium: 8.8 mg/dL — ABNORMAL LOW (ref 8.9–10.3)
Chloride: 103 mmol/L (ref 98–111)
Creatinine: 1.07 mg/dL — ABNORMAL HIGH (ref 0.44–1.00)
GFR, Estimated: 53 mL/min — ABNORMAL LOW (ref >60)
Glucose, Bld: 98 mg/dL (ref 70–99)
Potassium: 3.2 mmol/L — ABNORMAL LOW (ref 3.5–5.1)
Sodium: 137 mmol/L (ref 135–145)
Total Bilirubin: 0.5 mg/dL (ref 0.3–1.2)
Total Protein: 6 g/dL — ABNORMAL LOW (ref 6.5–8.1)

## 2023-02-12 LAB — MAGNESIUM: Magnesium: 1.8 mg/dL (ref 1.7–2.4)

## 2023-02-12 MED ORDER — SODIUM CHLORIDE 0.9 % IV SOLN
70.0000 mg/m2 | Freq: Once | INTRAVENOUS | Status: AC
  Administered 2023-02-12: 100 mg via INTRAVENOUS
  Filled 2023-02-12: qty 100

## 2023-02-12 MED ORDER — POTASSIUM CHLORIDE IN NACL 20-0.9 MEQ/L-% IV SOLN
Freq: Once | INTRAVENOUS | Status: AC
  Filled 2023-02-12: qty 1000

## 2023-02-12 MED ORDER — SODIUM CHLORIDE 0.9 % IV SOLN
Freq: Once | INTRAVENOUS | Status: DC
  Filled 2023-02-12: qty 250

## 2023-02-12 MED ORDER — SODIUM CHLORIDE 0.9 % IV SOLN
150.0000 mg | Freq: Once | INTRAVENOUS | Status: AC
  Administered 2023-02-12: 150 mg via INTRAVENOUS
  Filled 2023-02-12: qty 150

## 2023-02-12 MED ORDER — SODIUM CHLORIDE 0.9 % IV SOLN
1000.0000 mg/m2 | Freq: Once | INTRAVENOUS | Status: AC
  Administered 2023-02-12: 1406 mg via INTRAVENOUS
  Filled 2023-02-12: qty 36.98

## 2023-02-12 MED ORDER — HEPARIN SOD (PORK) LOCK FLUSH 100 UNIT/ML IV SOLN
500.0000 [IU] | Freq: Once | INTRAVENOUS | Status: AC | PRN
  Administered 2023-02-12: 500 [IU]
  Filled 2023-02-12: qty 5

## 2023-02-12 MED ORDER — SODIUM CHLORIDE 0.9 % IV SOLN
Freq: Once | INTRAVENOUS | Status: AC
  Filled 2023-02-12: qty 250

## 2023-02-12 MED ORDER — PALONOSETRON HCL INJECTION 0.25 MG/5ML
0.2500 mg | Freq: Once | INTRAVENOUS | Status: AC
  Administered 2023-02-12: 0.25 mg via INTRAVENOUS
  Filled 2023-02-12: qty 5

## 2023-02-12 MED ORDER — MAGNESIUM SULFATE 2 GM/50ML IV SOLN
2.0000 g | Freq: Once | INTRAVENOUS | Status: AC
  Administered 2023-02-12: 2 g via INTRAVENOUS
  Filled 2023-02-12: qty 50

## 2023-02-12 MED ORDER — SODIUM CHLORIDE 0.9 % IV SOLN
10.0000 mg | Freq: Once | INTRAVENOUS | Status: AC
  Administered 2023-02-12: 10 mg via INTRAVENOUS
  Filled 2023-02-12: qty 10

## 2023-02-12 NOTE — Patient Instructions (Signed)
[facility]  Discharge Instructions: Thank you for choosing [facility] to provide your oncology and hematology care.  If you have a lab appointment with the Cancer Center, please go directly to the Cancer Center and check in at the registration area.  Wear comfortable clothing and clothing appropriate for easy access to any Portacath or PICC line.   We strive to give you quality time with your provider. You may need to reschedule your appointment if you arrive late (15 or more minutes).  Arriving late affects you and other patients whose appointments are after yours.  Also, if you miss three or more appointments without notifying the office, you may be dismissed from the clinic at the provider's discretion.      For prescription refill requests, have your pharmacy contact our office and allow 72 hours for refills to be completed.    Today you received the following chemotherapy and/or immunotherapy agents Gemcitabine and Cisplatin.      To help prevent nausea and vomiting after your treatment, we encourage you to take your nausea medication as directed.  BELOW ARE SYMPTOMS THAT SHOULD BE REPORTED IMMEDIATELY: *FEVER GREATER THAN 100.4 F (38 C) OR HIGHER *CHILLS OR SWEATING *NAUSEA AND VOMITING THAT IS NOT CONTROLLED WITH YOUR NAUSEA MEDICATION *UNUSUAL SHORTNESS OF BREATH *UNUSUAL BRUISING OR BLEEDING *URINARY PROBLEMS (pain or burning when urinating, or frequent urination) *BOWEL PROBLEMS (unusual diarrhea, constipation, pain near the anus) TENDERNESS IN MOUTH AND THROAT WITH OR WITHOUT PRESENCE OF ULCERS (sore throat, sores in mouth, or a toothache) UNUSUAL RASH, SWELLING OR PAIN  UNUSUAL VAGINAL DISCHARGE OR ITCHING   Items with * indicate a potential emergency and should be followed up as soon as possible or go to the Emergency Department if any problems should occur.  Please show the CHEMOTHERAPY ALERT CARD or IMMUNOTHERAPY ALERT  CARD at check-in to the Emergency Department and triage nurse.  Should you have questions after your visit or need to cancel or reschedule your appointment, please contact [facility]  [phone] and follow the prompts.  Office hours are 8:00 a.m. to 4:30 p.m. Monday - Friday. Please note that voicemails left after 4:00 p.m. may not be returned until the following business day.  We are closed weekends and major holidays. You have access to a nurse at all times for urgent questions. Please call the main number to the clinic [phone] and follow the prompts.  For any non-urgent questions, you may also contact your provider using MyChart. We now offer e-Visits for anyone 18 and older to request care online for non-urgent symptoms. For details visit [email or web address].   Also download the MyChart app! Go to the app store, search "MyChart", open the app, select [facility], and log in with your MyChart username and password.    

## 2023-02-12 NOTE — Progress Notes (Signed)
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  INTERVAL HISTORY: Patient returns to clinic today for further evaluation and consideration of cycle 4, day 1 of cisplatin and gemcitabine.  Her appetite has significantly improved and she does not complain of any further weakness and fatigue. She has no neurologic complaints.  She denies any recent fevers or illnesses. She has no chest pain, shortness of breath, cough, or hemoptysis.  She denies any nausea, vomiting, constipation, or diarrhea.  She has not no urinary complaints.  Patient offers no specific complaints today.  REVIEW OF SYSTEMS:   Review of Systems  Constitutional: Negative.  Negative for fever, malaise/fatigue and weight loss.  Respiratory: Negative.  Negative for cough and shortness of breath.   Cardiovascular: Negative.  Negative for chest pain and leg swelling.  Gastrointestinal: Negative.  Negative for abdominal pain.  Genitourinary: Negative.  Negative for hematuria.  Musculoskeletal: Negative.  Negative for back pain.  Skin: Negative.  Negative for rash.  Neurological: Negative.  Negative for dizziness, focal weakness, weakness and headaches.  Psychiatric/Behavioral: Negative.  The patient is not nervous/anxious.     As per HPI. Otherwise, a complete review of systems is negative.  PAST MEDICAL HISTORY: Past Medical History:  Diagnosis Date   Back pain    Basal cell carcinoma 08/04/2020   0.5cm above the left mid brow, EDC 09/15/20   Cancer (HCC)    Melanoma   Curvature of spine    Dysplastic nevus 04/30/2013   left post shoulder, left parasternal   Dysplastic nevus 05/04/2014   right prox ant deltoid, left lat buttock   Dysplastic nevus 03/26/2019   right lat neck adjacent to  the inf top of MM in situ site scar   Dysplastic nevus 10/29/2019   upper back left paraspinal at level of mid scapula   Dysplastic nevus 03/31/2020, 05/12/20 shave removal   Left mid pretibial. Moderate to severe atypia, deep margin involved.   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   right lat neck inf to right angle of mandible, excised: 07/29/2018, margins free   Nephrolithiasis    Osteopenia    Osteoporosis    Pelvic pain in female    Raynaud disease    Thyromegaly     PAST SURGICAL HISTORY: Past Surgical History:  Procedure Laterality Date   ABDOMINAL HYSTERECTOMY     BILATERAL OOPHORECTOMY  1999   CESAREAN SECTION     COLONOSCOPY WITH PROPOFOL N/A 05/09/2015   Procedure: COLONOSCOPY WITH PROPOFOL;  Surgeon: Elnita Maxwell, MD;  Location: [facility] END[facility];  Service: Endoscopy;  Laterality: N/A;   CYSTOSCOPY WITH RETROGRADE PYELOGRAM, URETEROSCOPY AND STENT PLACEMENT Left 10/09/2022   Procedure: CYSTOSCOPY WITH RETROGRADE PYELOGRAM, URETEROSCOPY AND STENT PLACEMENT;  Surgeon: Riki Altes, MD;  Location: [facility] ORS;  Service: Urology;  Laterality: Left;   IR IMAGING GUIDED PORT INSERTION  12/06/2022   MELANOMA EXCISION Right 07/29/2018  lateral neck inf. to right angle of mandible   OOPHORECTOMY     URETERAL BIOPSY Left 10/09/2022   Procedure: URETERAL BIOPSY;  Surgeon: Riki Altes, MD;  Location: [facility] ORS;  Service: Urology;  Laterality: Left;    FAMILY HISTORY: Family History  Problem Relation Age of Onset   Emphysema Father    Cancer Neg Hx    Heart disease Neg Hx    Diabetes Neg Hx    Breast cancer Neg Hx     ADVANCED DIRECTIVES (Y/N):   N  HEALTH MAINTENANCE: Social History   Tobacco Use   Smoking status: Never    Passive exposure: Never   Smokeless tobacco: Never  Vaping Use   Vaping Use: Never used  Substance Use Topics   Alcohol use: No   Drug use: No     Colonoscopy:  PAP:  Bone density:  Lipid panel:  Allergies  Allergen Reactions   Codeine Nausea And Vomiting   Neosporin Wound Cleanser [Benzalkonium Chloride] Other (See Comments)    Skin blisters   Penicillins     Current Outpatient Medications  Medication Sig Dispense Refill   acetaminophen (TYLENOL) 325 MG tablet Take 650 mg by mouth every 6 (six) hours as needed.     calcium-vitamin D (OSCAL WITH D) 500-200 MG-UNIT tablet 1 tablet daily with breakfast.     Cholecalciferol 25 MCG (1000 UT) tablet Take 1,000 Units by mouth daily.     cyanocobalamin 1000 MCG tablet Take 1,000 mcg by mouth daily.     dexamethasone (DECADRON) 4 MG tablet Take 1 tablet (4 mg total) by mouth daily. 30 tablet 2   donepezil (ARICEPT) 10 MG tablet TAKE 1 TABLET BY MOUTH ONCE EVERY EVENING     lidocaine-prilocaine (EMLA) cream Apply to affected area once 30 g 3   meloxicam (MOBIC) 7.5 MG tablet Take 1 tablet by mouth daily.     memantine (NAMENDA) 5 MG tablet Take 5 mg by mouth 2 (two) times daily.     omeprazole (PRILOSEC) 40 MG capsule 40 mg daily.     ondansetron (ZOFRAN) 8 MG tablet Take 1 tablet (8 mg total) by mouth every 8 (eight) hours as needed for nausea or vomiting. Start on the third day after cisplatin. 60 tablet 2   prochlorperazine (COMPAZINE) 10 MG tablet Take 1 tablet (10 mg total) by mouth every 6 (six) hours as needed (Nausea or vomiting). 60 tablet 2   raloxifene (EVISTA) 60 MG tablet TAKE 1 TABLET BY MOUTH ONCE A DAY 90 tablet 1   rosuvastatin (CRESTOR) 5 MG tablet Take 5 mg by mouth daily.     No current facility-administered medications for this visit.   Facility-Administered Medications Ordered in Other Visits  Medication Dose Route Frequency  Provider Last Rate Last Admin   0.9 %  sodium chloride infusion   Intravenous Once Orlie Dakin, Tollie Pizza, MD       0.9 %  sodium chloride infusion   Intravenous Once Orlie Dakin, Tollie Pizza, MD       CISplatin (PLATINOL) 100 mg in sodium chloride 0.9 % 500 mL chemo infusion  70 mg/m2 (Treatment Plan Recorded) Intravenous Once Jeralyn Ruths, MD       dexamethasone (DECADRON) 10 mg in sodium chloride 0.9 % 50 mL IVPB  10 mg Intravenous Once Jeralyn Ruths, MD       fosaprepitant (EMEND) 150 mg in sodium chloride 0.9 % 145 mL IVPB  150 mg Intravenous Once Jeralyn Ruths, MD  gemcitabine (GEMZAR) 1,406 mg in sodium chloride 0.9 % 250 mL chemo infusion  1,000 mg/m2 (Treatment Plan Recorded) Intravenous Once Jeralyn Ruths, MD       heparin lock flush 100 unit/mL  500 Units Intracatheter Once PRN Jeralyn Ruths, MD       magnesium sulfate IVPB 2 g 50 mL  2 g Intravenous Once Jeralyn Ruths, MD       palonosetron (ALOXI) injection 0.25 mg  0.25 mg Intravenous Once Jeralyn Ruths, MD        OBJECTIVE: Vitals:   02/12/23 0853 02/12/23 0855  BP: (!) 155/67 (!) 154/65  Pulse: 62   Temp: (!) 97.2 F (36.2 C)   SpO2: 100%      Body mass index is 22.65 kg/m.    ECOG FS:0 - Asymptomatic  General: Well-developed, well-nourished, no acute distress. Eyes: Pink conjunctiva, anicteric sclera. HEENT: Normocephalic, moist mucous membranes. Lungs: No audible wheezing or coughing. Heart: Regular rate and rhythm. Abdomen: Soft, nontender, no obvious distention. Musculoskeletal: No edema, cyanosis, or clubbing. Neuro: Alert, answering all questions appropriately. Cranial nerves grossly intact. Skin: No rashes or petechiae noted. Psych: Normal affect.   LAB RESULTS:  Lab Results  Component Value Date   NA 137 02/12/2023   K 3.2 (L) 02/12/2023   CL 103 02/12/2023   CO2 25 02/12/2023   GLUCOSE 98 02/12/2023   BUN 35 (H) 02/12/2023   CREATININE 1.07 (H) 02/12/2023    CALCIUM 8.8 (L) 02/12/2023   PROT 6.0 (L) 02/12/2023   ALBUMIN 3.7 02/12/2023   AST 23 02/12/2023   ALT 15 02/12/2023   ALKPHOS 40 02/12/2023   BILITOT 0.5 02/12/2023   GFRNONAA 53 (L) 02/12/2023    Lab Results  Component Value Date   WBC 7.3 02/12/2023   NEUTROABS 4.9 02/12/2023   HGB 10.0 (L) 02/12/2023   HCT 30.2 (L) 02/12/2023   MCV 85.6 02/12/2023   PLT 211 02/12/2023     STUDIES: No results found.  ASSESSMENT: Urothelial carcinoma of the kidney.  PLAN:    Urothelial carcinoma of the kidney: Confirmed by biopsy.  PET scan results on November 14, 2022 reviewed independently with no significant hypermetabolism of known malignancy.  Imaging does not reveal any metastatic disease.  After consultation with Dr. Apolinar Junes, it is recommended that patient pursue neoadjuvant chemotherapy followed by surgical resection.  Patient will receive cisplatin and gemcitabine on day 1 with gemcitabine on day 8 and a 21-day cycle.  Plan to give 4 cycles and then refer back to urology for surgical resection.  Proceed with cycle 4, day 1 of treatment today.  Return to clinic in 1 week for further evaluation and consideration of cycle 4, day 8.     Hematuria: Patient does not complain of this today.  Secondary to malignancy. Renal insufficiency: Resolved.  Patient will return Friday for an extra liter of IV fluids. Anemia: Hemoglobin has trended down slightly to 10.0, monitor. Thrombocytopenia: Resolved.   Poor appetite: Improved, continue dexamethasone. Hypokalemia: Patient will receive IV potassium with treatment today.  She was also given dietary recommendations.  Patient expressed understanding and was in agreement with this plan. She also understands that She can call clinic at any time with any questions, concerns, or complaints.    Cancer Staging  Urothelial carcinoma of kidney [facility]) Staging form: Kidney, AJCC 8th Edition - Clinical stage from 12/01/2022: Stage Unknown (cTX, cN0, cM0) -  Signed by Jeralyn Ruths, MD on 12/01/2022 Stage prefix: Initial  diagnosis Histologic grade (G): G3 Histologic grading system: 4 grade system   Jeralyn Ruths, MD   02/12/2023 9:45 AM

## 2023-02-12 NOTE — Progress Notes (Signed)
1344: MD okay with post fluids running with Cisplatin treatment.

## 2023-02-13 ENCOUNTER — Telehealth: Payer: Self-pay | Admitting: Urology

## 2023-02-13 NOTE — Telephone Encounter (Signed)
Patient's daughter Joni Reining) called and rescheduled patient's appointment this morning. She asked if Dr. Apolinar Junes would order PET scan prior to this appointment, or does her Oncologist need to order it? Please call daughter and let her know. [phone]

## 2023-02-13 NOTE — Telephone Encounter (Signed)
Daughter advised.

## 2023-02-13 NOTE — Telephone Encounter (Signed)
We will discuss at her f/u.  She will not need another PET.  Vanna Scotland, MD

## 2023-02-14 ENCOUNTER — Ambulatory Visit: Payer: PPO | Admitting: Dermatology

## 2023-02-14 VITALS — BP 154/86 | HR 69

## 2023-02-14 DIAGNOSIS — Z86006 Personal history of melanoma in-situ: Secondary | ICD-10-CM | POA: Diagnosis not present

## 2023-02-14 DIAGNOSIS — Z86018 Personal history of other benign neoplasm: Secondary | ICD-10-CM

## 2023-02-14 DIAGNOSIS — L814 Other melanin hyperpigmentation: Secondary | ICD-10-CM

## 2023-02-14 DIAGNOSIS — Z85828 Personal history of other malignant neoplasm of skin: Secondary | ICD-10-CM | POA: Diagnosis not present

## 2023-02-14 DIAGNOSIS — L578 Other skin changes due to chronic exposure to nonionizing radiation: Secondary | ICD-10-CM

## 2023-02-14 DIAGNOSIS — Z1283 Encounter for screening for malignant neoplasm of skin: Secondary | ICD-10-CM

## 2023-02-14 DIAGNOSIS — L821 Other seborrheic keratosis: Secondary | ICD-10-CM | POA: Diagnosis not present

## 2023-02-14 DIAGNOSIS — Z85820 Personal history of malignant melanoma of skin: Secondary | ICD-10-CM

## 2023-02-14 NOTE — Progress Notes (Signed)
   Follow-Up Visit   Subjective  Jessica Morrison is a 77 y.o. female who presents for the following: Skin Cancer Screening and Full Body Skin Exam, hx of Melanoma, hx of BCC, hx of Dysplastic nevus, patient was  diagnosed with Kidney cancer which she is treating with Chemo.  The patient presents for Total-Body Skin Exam (TBSE) for skin cancer screening and mole check. The patient has spots, moles and lesions to be evaluated, some may be new or changing and the patient has concerns that these could be cancer.    The following portions of the chart were reviewed this encounter and updated as appropriate: medications, allergies, medical history  Review of Systems:  No other skin or systemic complaints except as noted in HPI or Assessment and Plan.  Objective  Well appearing patient in no apparent distress; mood and affect are within normal limits.  A full examination was performed including scalp, head, eyes, ears, nose, lips, neck, chest, axillae, abdomen, back, buttocks, bilateral upper extremities, bilateral lower extremities, hands, feet, fingers, toes, fingernails, and toenails. All findings within normal limits unless otherwise noted below.   Relevant physical exam findings are noted in the Assessment and Plan.    Assessment & Plan   LENTIGINES, SEBORRHEIC KERATOSES, HEMANGIOMAS - Benign normal skin lesions - Benign-appearing - Call for any changes  MELANOCYTIC NEVI - Tan-brown and/or pink-flesh-colored symmetric macules and papules - Benign appearing on exam today - Observation - Call clinic for new or changing moles - Recommend daily use of broad spectrum spf 30+ sunscreen to sun-exposed areas.   ACTINIC DAMAGE - Chronic condition, secondary to cumulative UV/sun exposure - diffuse scaly erythematous macules with underlying dyspigmentation - Recommend daily broad spectrum sunscreen SPF 30+ to sun-exposed areas, reapply every 2 hours as needed.  - Staying in the shade or  wearing long sleeves, sun glasses (UVA+UVB protection) and wide brim hats (4-inch brim around the entire circumference of the hat) are also recommended for sun protection.  - Call for new or changing lesions.  HISTORY OF BASAL CELL CARCINOMA OF THE SKIN Left mid eyebrow 2021 - No evidence of recurrence today - Recommend regular full body skin exams - Recommend daily broad spectrum sunscreen SPF 30+ to sun-exposed areas, reapply every 2 hours as needed.  - Call if any new or changing lesions are noted between office visits   HISTORY OF DYSPLASTIC NEVUS No evidence of recurrence today Recommend regular full body skin exams Recommend daily broad spectrum sunscreen SPF 30+ to sun-exposed areas, reapply every 2 hours as needed.  Call if any new or changing lesions are noted between office visits   HISTORY OF MELANOMA IN SITU  Right lateral neck inferior to right angle of mandible 2019 - No evidence of recurrence today - No lymphadenopathy - Recommend regular full body skin exams - Recommend daily broad spectrum sunscreen SPF 30+ to sun-exposed areas, reapply every 2 hours as needed.  - Call if any new or changing lesions are noted between office visits   KIDNEY CANCER diagnosis 2023 currently finishing chemo treatment    SKIN CANCER SCREENING PERFORMED TODAY.  Return in about 6 months (around 08/17/2023) for TBSE, hx of Melanoma .  IAngelique Holm, CMA, am acting as scribe for Armida Sans, MD .   Documentation: I have reviewed the above documentation for accuracy and completeness, and I agree with the above.  Armida Sans, MD

## 2023-02-14 NOTE — Patient Instructions (Signed)
Due to recent changes in healthcare laws, you may see results of your pathology and/or laboratory studies on MyChart before the doctors have had a chance to review them. We understand that in some cases there may be results that are confusing or concerning to you. Please understand that not all results are received at the same time and often the doctors may need to interpret multiple results in order to provide you with the best plan of care or course of treatment. Therefore, we ask that you please give us 2 business days to thoroughly review all your results before contacting the office for clarification. Should we see a critical lab result, you will be contacted sooner.   If You Need Anything After Your Visit  If you have any questions or concerns for your doctor, please call our main line at [phone] and press option 4 to reach your doctor's medical assistant. If no one answers, please leave a voicemail as directed and we will return your call as soon as possible. Messages left after 4 pm will be answered the following business day.   You may also send us a message via MyChart. We typically respond to MyChart messages within 1-2 business days.  For prescription refills, please ask your pharmacy to contact our office. Our fax number is [phone].  If you have an urgent issue when the clinic is closed that cannot wait until the next business day, you can page your doctor at the number below.    Please note that while we do our best to be available for urgent issues outside of office hours, we are not available 24/7.   If you have an urgent issue and are unable to reach us, you may choose to seek medical care at your doctor's office, retail clinic, urgent care center, or emergency room.  If you have a medical emergency, please immediately call 911 or go to the emergency department.  Pager Numbers  - Dr. Kowalski: [phone]  - Dr. Moye: [phone]  - Dr. Stewart:  [phone]  In the event of inclement weather, please call our main line at [phone] for an update on the status of any delays or closures.  Dermatology Medication Tips: Please keep the boxes that topical medications come in in order to help keep track of the instructions about where and how to use these. Pharmacies typically print the medication instructions only on the boxes and not directly on the medication tubes.   If your medication is too expensive, please contact our office at [phone] option 4 or send us a message through MyChart.   We are unable to tell what your co-pay for medications will be in advance as this is different depending on your insurance coverage. However, we may be able to find a substitute medication at lower cost or fill out paperwork to get insurance to cover a needed medication.   If a prior authorization is required to get your medication covered by your insurance company, please allow us 1-2 business days to complete this process.  Drug prices often vary depending on where the prescription is filled and some pharmacies may offer cheaper prices.  The website www.goodrx.com contains coupons for medications through different pharmacies. The prices here do not account for what the cost may be with help from insurance (it may be cheaper with your insurance), but the website can give you the price if you did not use any insurance.  - You can print the associated coupon and take it with   your prescription to the pharmacy.  - You may also stop by our office during regular business hours and pick up a GoodRx coupon card.  - If you need your prescription sent electronically to a different pharmacy, notify our office through [facility] MyChart or by phone at [phone] option 4.     Si Usted Necesita Algo Despus de Su Visita  Tambin puede enviarnos un mensaje a travs de MyChart. Por lo general respondemos a los mensajes de MyChart en el transcurso de 1 a 2  das hbiles.  Para renovar recetas, por favor pida a su farmacia que se ponga en contacto con nuestra oficina. Nuestro nmero de fax es el [phone].  Si tiene un asunto urgente cuando la clnica est cerrada y que no puede esperar hasta el siguiente da hbil, puede llamar/localizar a su doctor(a) al nmero que aparece a continuacin.   Por favor, tenga en cuenta que aunque hacemos todo lo posible para estar disponibles para asuntos urgentes fuera del horario de oficina, no estamos disponibles las 24 horas del da, los 7 das de la semana.   Si tiene un problema urgente y no puede comunicarse con nosotros, puede optar por buscar atencin mdica  en el consultorio de su doctor(a), en una clnica privada, en un centro de atencin urgente o en una sala de emergencias.  Si tiene una emergencia mdica, por favor llame inmediatamente al 911 o vaya a la sala de emergencias.  Nmeros de bper  - Dr. Kowalski: [phone]  - Dra. Moye: [phone]  - Dra. Stewart: [phone]  En caso de inclemencias del tiempo, por favor llame a nuestra lnea principal al [phone] para una actualizacin sobre el estado de cualquier retraso o cierre.  Consejos para la medicacin en dermatologa: Por favor, guarde las cajas en las que vienen los medicamentos de uso tpico para ayudarle a seguir las instrucciones sobre dnde y cmo usarlos. Las farmacias generalmente imprimen las instrucciones del medicamento slo en las cajas y no directamente en los tubos del medicamento.   Si su medicamento es muy caro, por favor, pngase en contacto con nuestra oficina llamando al [phone] y presione la opcin 4 o envenos un mensaje a travs de MyChart.   No podemos decirle cul ser su copago por los medicamentos por adelantado ya que esto es diferente dependiendo de la cobertura de su seguro. Sin embargo, es posible que podamos encontrar un medicamento sustituto a menor costo o llenar un formulario para que el  seguro cubra el medicamento que se considera necesario.   Si se requiere una autorizacin previa para que su compaa de seguros cubra su medicamento, por favor permtanos de 1 a 2 das hbiles para completar este proceso.  Los precios de los medicamentos varan con frecuencia dependiendo del lugar de dnde se surte la receta y alguna farmacias pueden ofrecer precios ms baratos.  El sitio web www.goodrx.com tiene cupones para medicamentos de diferentes farmacias. Los precios aqu no tienen en cuenta lo que podra costar con la ayuda del seguro (puede ser ms barato con su seguro), pero el sitio web puede darle el precio si no utiliz ningn seguro.  - Puede imprimir el cupn correspondiente y llevarlo con su receta a la farmacia.  - Tambin puede pasar por nuestra oficina durante el horario de atencin regular y recoger una tarjeta de cupones de GoodRx.  - Si necesita que su receta se enve electrnicamente a una farmacia diferente, informe a nuestra oficina a travs de MyChart de [place]   o por telfono llamando al [phone] y presione la opcin 4.  

## 2023-02-15 ENCOUNTER — Encounter: Payer: Self-pay | Admitting: Medical Oncology

## 2023-02-15 ENCOUNTER — Inpatient Hospital Stay (HOSPITAL_BASED_OUTPATIENT_CLINIC_OR_DEPARTMENT_OTHER): Payer: PPO | Admitting: Medical Oncology

## 2023-02-15 ENCOUNTER — Inpatient Hospital Stay: Payer: PPO

## 2023-02-15 ENCOUNTER — Inpatient Hospital Stay: Payer: PPO | Attending: Oncology

## 2023-02-15 ENCOUNTER — Other Ambulatory Visit: Payer: Self-pay

## 2023-02-15 VITALS — BP 154/75 | HR 65 | Temp 98.1°F | Resp 20 | Wt 111.4 lb

## 2023-02-15 VITALS — BP 154/75 | HR 65 | Temp 98.1°F | Resp 20 | Wt 111.0 lb

## 2023-02-15 DIAGNOSIS — R600 Localized edema: Secondary | ICD-10-CM | POA: Insufficient documentation

## 2023-02-15 DIAGNOSIS — C642 Malignant neoplasm of left kidney, except renal pelvis: Secondary | ICD-10-CM | POA: Diagnosis not present

## 2023-02-15 DIAGNOSIS — Z5111 Encounter for antineoplastic chemotherapy: Secondary | ICD-10-CM | POA: Diagnosis not present

## 2023-02-15 LAB — CMP (CANCER CENTER ONLY)
ALT: 33 U/L (ref 0–44)
AST: 32 U/L (ref 15–41)
Albumin: 3.4 g/dL — ABNORMAL LOW (ref 3.5–5.0)
Alkaline Phosphatase: 40 U/L (ref 38–126)
Anion gap: 9 (ref 5–15)
BUN: 58 mg/dL — ABNORMAL HIGH (ref 8–23)
CO2: 23 mmol/L (ref 22–32)
Calcium: 8.4 mg/dL — ABNORMAL LOW (ref 8.9–10.3)
Chloride: 100 mmol/L (ref 98–111)
Creatinine: 1.43 mg/dL — ABNORMAL HIGH (ref 0.44–1.00)
GFR, Estimated: 38 mL/min — ABNORMAL LOW (ref >60)
Glucose, Bld: 114 mg/dL — ABNORMAL HIGH (ref 70–99)
Potassium: 3.8 mmol/L (ref 3.5–5.1)
Sodium: 132 mmol/L — ABNORMAL LOW (ref 135–145)
Total Bilirubin: 0.6 mg/dL (ref 0.3–1.2)
Total Protein: 5.4 g/dL — ABNORMAL LOW (ref 6.5–8.1)

## 2023-02-15 LAB — CBC WITH DIFFERENTIAL (CANCER CENTER ONLY)
Abs Immature Granulocytes: 0.05 10*3/uL (ref 0.00–0.07)
Basophils Absolute: 0 10*3/uL (ref 0.0–0.1)
Basophils Relative: 0 %
Eosinophils Absolute: 0 10*3/uL (ref 0.0–0.5)
Eosinophils Relative: 0 %
HCT: 29.7 % — ABNORMAL LOW (ref 36.0–46.0)
Hemoglobin: 10.1 g/dL — ABNORMAL LOW (ref 12.0–15.0)
Immature Granulocytes: 1 %
Lymphocytes Relative: 4 %
Lymphs Abs: 0.3 10*3/uL — ABNORMAL LOW (ref 0.7–4.0)
MCH: 28.5 pg (ref 26.0–34.0)
MCHC: 34 g/dL (ref 30.0–36.0)
MCV: 83.7 fL (ref 80.0–100.0)
Monocytes Absolute: 0.2 10*3/uL (ref 0.1–1.0)
Monocytes Relative: 2 %
Neutro Abs: 6.3 10*3/uL (ref 1.7–7.7)
Neutrophils Relative %: 93 %
Platelet Count: 222 10*3/uL (ref 150–400)
RBC: 3.55 MIL/uL — ABNORMAL LOW (ref 3.87–5.11)
RDW: 18.7 % — ABNORMAL HIGH (ref 11.5–15.5)
WBC Count: 6.7 10*3/uL (ref 4.0–10.5)
nRBC: 0 % (ref 0.0–0.2)

## 2023-02-15 MED ORDER — HEPARIN SOD (PORK) LOCK FLUSH 100 UNIT/ML IV SOLN
500.0000 [IU] | Freq: Once | INTRAVENOUS | Status: AC | PRN
  Administered 2023-02-15: 500 [IU]
  Filled 2023-02-15: qty 5

## 2023-02-15 MED ORDER — SODIUM CHLORIDE 0.9% FLUSH
10.0000 mL | Freq: Once | INTRAVENOUS | Status: AC | PRN
  Administered 2023-02-15: 10 mL
  Filled 2023-02-15: qty 10

## 2023-02-15 MED ORDER — FUROSEMIDE 20 MG PO TABS: ORAL_TABLET | ORAL | 0 refills | Status: DC

## 2023-02-15 MED ORDER — SODIUM CHLORIDE 0.9 % IV SOLN
Freq: Once | INTRAVENOUS | Status: DC
  Filled 2023-02-15: qty 250

## 2023-02-15 NOTE — Progress Notes (Signed)
Symptom Management Clinic [facility] at [facility] [phone] [phone]  Patient Care Team: Marguarite Arbour, MD as PCP - General (Internal Medicine)   Name of the patient: Jessica Morrison  [id number]  1946-04-02   Oncological History: Urothelial Carcinoma of Kidney  Current Treatment: Neoajuvant Cisplatin and gemcitabine s/p cycle 4 day 1 (02/12/2023)  Date of visit: 02/15/23  Reason for Consult: ANDRIELLE DECKARD is a 77 y.o. female who presents today originally for IVF however she voices concern over swelling of her legs that she has noticed over the past 1-2 days and was switched to be seen by [facility]:  She reports that yesterday she noticed that she had developed swelling of bilateral lower legs. She also has had some mild SOB and abdominal swelling. She reports no chest pain, calf pain. She has no history of CHF. She has not tried anything for symptoms.    Denies any neurologic complaints. Denies recent fevers or illnesses. Denies any easy bleeding or bruising. Reports good appetite and denies weight loss. Denies chest pain. Denies any nausea, vomiting, constipation, or diarrhea. Denies urinary complaints. Patient offers no further specific complaints today.  Wt Readings from Last 3 Encounters:  02/15/23 111 lb (50.3 kg)  02/15/23 111 lb 7 oz (50.5 kg)  02/12/23 106 lb 8 oz (48.3 kg)     PAST MEDICAL HISTORY: Past Medical History:  Diagnosis Date   Back pain    Basal cell carcinoma 08/04/2020   0.5cm above the left mid brow, EDC 09/15/20   Cancer (HCC)    Melanoma   Curvature of spine    Dysplastic nevus 04/30/2013   left post shoulder, left parasternal   Dysplastic nevus 05/04/2014   right prox ant deltoid, left lat buttock   Dysplastic nevus 03/26/2019   right lat neck adjacent to the inf top of MM in situ site scar   Dysplastic nevus 10/29/2019   upper back left paraspinal at level of mid scapula   Dysplastic nevus  03/31/2020, 05/12/20 shave removal   Left mid pretibial. Moderate to severe atypia, deep margin involved.   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   right lat neck inf to right angle of mandible, excised: 07/29/2018, margins free   Nephrolithiasis    Osteopenia    Osteoporosis    Pelvic pain in female    Raynaud disease    Thyromegaly     PAST SURGICAL HISTORY:  Past Surgical History:  Procedure Laterality Date   ABDOMINAL HYSTERECTOMY     BILATERAL OOPHORECTOMY  1999   CESAREAN SECTION     COLONOSCOPY WITH PROPOFOL N/A 05/09/2015   Procedure: COLONOSCOPY WITH PROPOFOL;  Surgeon: Elnita Maxwell, MD;  Location: [facility] ENDOSCOPY;  Service: Endoscopy;  Laterality: N/A;   CYSTOSCOPY WITH RETROGRADE PYELOGRAM, URETEROSCOPY AND STENT PLACEMENT Left 10/09/2022   Procedure: CYSTOSCOPY WITH RETROGRADE PYELOGRAM, URETEROSCOPY AND STENT PLACEMENT;  Surgeon: Riki Altes, MD;  Location: [facility] ORS;  Service: Urology;  Laterality: Left;   IR IMAGING GUIDED PORT INSERTION  12/06/2022   MELANOMA EXCISION Right 07/29/2018   lateral neck inf.  to right angle of mandible   OOPHORECTOMY     URETERAL BIOPSY Left 10/09/2022   Procedure: URETERAL BIOPSY;  Surgeon: Riki Altes, MD;  Location: [facility] ORS;  Service: Urology;  Laterality: Left;    HEMATOLOGY/ONCOLOGY HISTORY:  Oncology History  Urothelial carcinoma of kidney (HCC)  11/30/2022 Initial Diagnosis   Urothelial carcinoma of kidney (HCC)   12/01/2022 Cancer Staging   Staging form: Kidney, AJCC 8th Edition - Clinical stage from 12/01/2022: Stage Unknown (cTX, cN0, cM0) - Signed by Jeralyn Ruths, MD on 12/01/2022 Stage prefix:  Initial diagnosis Histologic grade (G): G3 Histologic grading system: 4 grade system   12/11/2022 -  Chemotherapy   Patient is on Treatment Plan : BLADDER Cisplatin D1 + Gemcitabine D1,8 q21d x 4 Cycles       ALLERGIES:  is allergic to codeine, neosporin wound cleanser [benzalkonium chloride], and penicillins.  MEDICATIONS:  Current Outpatient Medications  Medication Sig Dispense Refill   acetaminophen (TYLENOL) 325 MG tablet Take 650 mg by mouth every 6 (six) hours as needed.     calcium-vitamin D (OSCAL WITH D) 500-200 MG-UNIT tablet 1 tablet daily with breakfast.     Cholecalciferol 25 MCG (1000 UT) tablet Take 1,000 Units by mouth daily.     cyanocobalamin 1000 MCG tablet Take 1,000 mcg by mouth daily.     donepezil (ARICEPT) 10 MG tablet TAKE 1 TABLET BY MOUTH ONCE EVERY EVENING     furosemide (LASIX) 20 MG tablet Take one tablet daily for 2 days then as needed once daily 30 tablet 0   meloxicam (MOBIC) 7.5 MG tablet Take 1 tablet by mouth daily.     memantine (NAMENDA) 5 MG tablet Take 5 mg by mouth 2 (two) times daily.     omeprazole (PRILOSEC) 40 MG capsule 40 mg daily.     ondansetron (ZOFRAN) 8 MG tablet Take 1 tablet (8 mg total) by mouth every 8 (eight) hours as needed for nausea or vomiting. Start on the third day after cisplatin. 60 tablet 2   prochlorperazine (COMPAZINE) 10 MG tablet Take 1 tablet (10 mg total) by mouth every 6 (six) hours as needed (Nausea or vomiting). 60 tablet 2   raloxifene (EVISTA) 60 MG tablet TAKE 1 TABLET BY MOUTH ONCE A DAY 90 tablet 1   rosuvastatin (CRESTOR) 5 MG tablet Take 5 mg by mouth daily.     dexamethasone (DECADRON) 4 MG tablet Take 1 tablet (4 mg total) by mouth daily. 30 tablet 2   lidocaine-prilocaine (EMLA) cream Apply to affected area once (Patient not taking: Reported on 02/15/2023) 30 g 3   No current facility-administered medications for this visit.   Facility-Administered Medications Ordered in Other Visits  Medication Dose  Route Frequency Provider Last Rate Last Admin   0.9 %  sodium chloride infusion   Intravenous Once Jeralyn Ruths, MD        VITAL SIGNS: BP (!) 154/75   Pulse 65   Temp 98.1 F (36.7 C) (Tympanic)   Resp 20   Wt 111 lb (50.3 kg)   BMI 23.60 kg/m  Filed Weights   02/15/23 0937  Weight: 111 lb (50.3 kg)    Estimated body mass index is 23.6 kg/m as calculated from the following:   Height as of 01/29/23: 4' 9.5" (1.461 m).   Weight as of this encounter: 111 lb (50.3 kg).  LABS: CBC:    Component Value Date/Time   WBC 6.7 02/15/2023 1000   HGB 10.1 (L)  02/15/2023 1000   HGB 12.4 11/27/2022 1200   HCT 29.7 (L) 02/15/2023 1000   HCT 37.5 11/27/2022 1200   PLT 222 02/15/2023 1000   MCV 83.7 02/15/2023 1000   MCV 84 11/27/2022 1200   NEUTROABS 6.3 02/15/2023 1000   NEUTROABS 4.8 11/27/2022 1200   LYMPHSABS 0.3 (L) 02/15/2023 1000   LYMPHSABS 1.4 11/27/2022 1200   MONOABS 0.2 02/15/2023 1000   EOSABS 0.0 02/15/2023 1000   EOSABS 0.2 11/27/2022 1200   BASOSABS 0.0 02/15/2023 1000   BASOSABS 0.1 11/27/2022 1200   Comprehensive Metabolic Panel:    Component Value Date/Time   NA 132 (L) 02/15/2023 1000   K 3.8 02/15/2023 1000   CL 100 02/15/2023 1000   CO2 23 02/15/2023 1000   BUN 58 (H) 02/15/2023 1000   CREATININE 1.43 (H) 02/15/2023 1000   GLUCOSE 114 (H) 02/15/2023 1000   CALCIUM 8.4 (L) 02/15/2023 1000   AST 32 02/15/2023 1000   ALT 33 02/15/2023 1000   ALKPHOS 40 02/15/2023 1000   BILITOT 0.6 02/15/2023 1000   PROT 5.4 (L) 02/15/2023 1000   ALBUMIN 3.4 (L) 02/15/2023 1000    RADIOGRAPHIC STUDIES: No results found.  PERFORMANCE STATUS (ECOG) : 1 - Symptomatic but completely ambulatory  Review of Systems Unless otherwise noted, a complete review of systems is negative.  Physical Exam General: NAD Cardiovascular: regular rate and rhythm Pulmonary: clear ant fields Abdomen: soft, nontender, no palpable ascites  Extremities: 1+ pitting edema of  bilateral legs, no joint deformities, negative Homans sign, no varicosities or calf tenderness.  Skin: no rashes, no erythema of legs.  Neurological: Weakness but otherwise nonfocal  Assessment and Plan- Patient is a 77 y.o. female    Encounter Diagnosis  Name Primary?   Peripheral edema Yes    Her peripheral edema is new. Concerning for CHF given her concurrent weight gain and chemotherapy use. Lower likelihood of bilateral DVT given lack of significant SOB, preserved O2, negative Homans sign on exam, and lack of calf pain. I have recommended we check a CBC, CMP, Pro-BNP to assess for CHF, anemia, renal or hepatic failure. Likely would benefit from lasix 20 mg PO once daily. I have discussed this with patient including common potential side effects and precautions. We will schedule a close follow up in office and send in a cardiology referral for further evaluation in efforts to maximize our ability to continue treatment of her renal cell carcinoma.   A note to covering provider: Please view results of Pro-BNP and ensure that she has a cardiology visit scheduled. She will need a repeat CMP to ensure electrolytes are stable on once daily 20 mg lasix. Please recheck weight and assess for any worsening SOB or edema. If edema has not improved or she has any calf pain/worsening SOB I would recommend DVT/PE work up.   Disposition:  Urgent cardiology referral Labs today (CBC, CMP, Pro BNP) Lasix to be sent to her pharmacy.  RTC Monday for weight/edema/lab recheck -[place]  Patient expressed understanding and was in agreement with this plan. She also understands that She can call clinic at any time with any questions, concerns, or complaints.   Thank you for allowing me to participate in the care of this very pleasant patient.   Time Total: 25  Visit consisted of counseling and education dealing with the complex and emotionally intense issues of symptom management in the setting of serious  illness.Greater than 50%  of this time was spent counseling and coordinating  care related to the above assessment and plan.  Signed by: Clent Jacks, PA-C

## 2023-02-15 NOTE — Progress Notes (Signed)
Pt here today for IVF. Upon arrival pt and visitor reported swelling of bilateral legs. BP 154/75. Pt added on to [facility]. Clent Jacks PA chairside to evaluate pt. Per Maralyn Sago, no IVF today. Labs drawn. Pt discharged home

## 2023-02-16 LAB — MISC LABCORP TEST (SEND OUT): Labcorp test code: 143000

## 2023-02-19 ENCOUNTER — Inpatient Hospital Stay: Payer: PPO

## 2023-02-19 ENCOUNTER — Inpatient Hospital Stay (HOSPITAL_BASED_OUTPATIENT_CLINIC_OR_DEPARTMENT_OTHER): Payer: PPO | Admitting: Oncology

## 2023-02-19 ENCOUNTER — Other Ambulatory Visit: Payer: Self-pay | Admitting: Urology

## 2023-02-19 ENCOUNTER — Ambulatory Visit: Payer: PPO | Admitting: Urology

## 2023-02-19 ENCOUNTER — Encounter: Payer: Self-pay | Admitting: Oncology

## 2023-02-19 VITALS — BP 147/78 | HR 60 | Ht <= 58 in | Wt 99.2 lb

## 2023-02-19 DIAGNOSIS — C642 Malignant neoplasm of left kidney, except renal pelvis: Secondary | ICD-10-CM | POA: Diagnosis not present

## 2023-02-19 DIAGNOSIS — N183 Chronic kidney disease, stage 3 unspecified: Secondary | ICD-10-CM | POA: Diagnosis not present

## 2023-02-19 DIAGNOSIS — Z5111 Encounter for antineoplastic chemotherapy: Secondary | ICD-10-CM | POA: Diagnosis not present

## 2023-02-19 LAB — CBC WITH DIFFERENTIAL (CANCER CENTER ONLY)
Abs Immature Granulocytes: 0.07 10*3/uL (ref 0.00–0.07)
Basophils Absolute: 0 10*3/uL (ref 0.0–0.1)
Basophils Relative: 0 %
Eosinophils Absolute: 0 10*3/uL (ref 0.0–0.5)
Eosinophils Relative: 0 %
HCT: 32.3 % — ABNORMAL LOW (ref 36.0–46.0)
Hemoglobin: 10.9 g/dL — ABNORMAL LOW (ref 12.0–15.0)
Immature Granulocytes: 1 %
Lymphocytes Relative: 7 %
Lymphs Abs: 0.5 10*3/uL — ABNORMAL LOW (ref 0.7–4.0)
MCH: 28.2 pg (ref 26.0–34.0)
MCHC: 33.7 g/dL (ref 30.0–36.0)
MCV: 83.5 fL (ref 80.0–100.0)
Monocytes Absolute: 0.2 10*3/uL (ref 0.1–1.0)
Monocytes Relative: 3 %
Neutro Abs: 5.7 10*3/uL (ref 1.7–7.7)
Neutrophils Relative %: 89 %
Platelet Count: 110 10*3/uL — ABNORMAL LOW (ref 150–400)
RBC: 3.87 MIL/uL (ref 3.87–5.11)
RDW: 18 % — ABNORMAL HIGH (ref 11.5–15.5)
WBC Count: 6.5 10*3/uL (ref 4.0–10.5)
nRBC: 0 % (ref 0.0–0.2)

## 2023-02-19 LAB — CMP (CANCER CENTER ONLY)
ALT: 36 U/L (ref 0–44)
AST: 29 U/L (ref 15–41)
Albumin: 3.8 g/dL (ref 3.5–5.0)
Alkaline Phosphatase: 42 U/L (ref 38–126)
Anion gap: 13 (ref 5–15)
BUN: 51 mg/dL — ABNORMAL HIGH (ref 8–23)
CO2: 27 mmol/L (ref 22–32)
Calcium: 8.9 mg/dL (ref 8.9–10.3)
Chloride: 94 mmol/L — ABNORMAL LOW (ref 98–111)
Creatinine: 1.48 mg/dL — ABNORMAL HIGH (ref 0.44–1.00)
GFR, Estimated: 36 mL/min — ABNORMAL LOW (ref >60)
Glucose, Bld: 113 mg/dL — ABNORMAL HIGH (ref 70–99)
Potassium: 3.2 mmol/L — ABNORMAL LOW (ref 3.5–5.1)
Sodium: 134 mmol/L — ABNORMAL LOW (ref 135–145)
Total Bilirubin: 0.6 mg/dL (ref 0.3–1.2)
Total Protein: 5.9 g/dL — ABNORMAL LOW (ref 6.5–8.1)

## 2023-02-19 MED ORDER — SODIUM CHLORIDE 0.9% FLUSH
10.0000 mL | INTRAVENOUS | Status: DC | PRN
  Administered 2023-02-19: 10 mL
  Filled 2023-02-19: qty 10

## 2023-02-19 MED ORDER — SODIUM CHLORIDE 0.9 % IV SOLN
Freq: Once | INTRAVENOUS | Status: AC
  Filled 2023-02-19: qty 250

## 2023-02-19 MED ORDER — SODIUM CHLORIDE 0.9% FLUSH
10.0000 mL | INTRAVENOUS | Status: DC | PRN
  Administered 2023-02-19: 10 mL via INTRAVENOUS
  Filled 2023-02-19: qty 10

## 2023-02-19 MED ORDER — SODIUM CHLORIDE 0.9 % IV SOLN
1000.0000 mg/m2 | Freq: Once | INTRAVENOUS | Status: AC
  Administered 2023-02-19: 1406 mg via INTRAVENOUS
  Filled 2023-02-19: qty 26.3

## 2023-02-19 MED ORDER — PROCHLORPERAZINE MALEATE 10 MG PO TABS
10.0000 mg | ORAL_TABLET | Freq: Once | ORAL | Status: AC
  Administered 2023-02-19: 10 mg via ORAL
  Filled 2023-02-19: qty 1

## 2023-02-19 NOTE — Progress Notes (Unsigned)
Surgical Physician Order Form [facility]   * Scheduling expectation :  ~6 weeks  *Length of Case:   *Clearance needed: no  *Anticoagulation Instructions: Hold all anticoagulants  *Aspirin Instructions: Hold Aspirin  *Post-op visit Date/Instructions:  1 week cath removal and pathology review with PA, 6 week post op with MD  *Diagnosis:  Left upper tract urothelial carcinoma  *Procedure: Left robotic nephro ureterectomy, instillation of intravesical gemcitabine   Additional orders: Gemcitabine 2000mg  bladder instillation  -Admit type: OUTpatient  -Anesthesia: General  -VTE Prophylaxis Standing Order SCD's       Other:   -Standing Lab Orders Per Anesthesia    Lab other: UA, urine culture, CBC, BMP, type and cross x 2, INR  -Standing Test orders EKG/Chest x-ray per Anesthesia       Test other:   - Medications:  Ancef 2gm IV   ok with allergy  -Other orders:  N/A

## 2023-02-19 NOTE — Patient Instructions (Signed)
[place] [facility]  Discharge Instructions: Thank you for choosing [facility] to provide your oncology and hematology care.  If you have a lab appointment with the Cancer Center, please go directly to the Cancer Center and check in at the registration area.  Wear comfortable clothing and clothing appropriate for easy access to any Portacath or PICC line.   We strive to give you quality time with your provider. You may need to reschedule your appointment if you arrive late (15 or more minutes).  Arriving late affects you and other patients whose appointments are after yours.  Also, if you miss three or more appointments without notifying the office, you may be dismissed from the clinic at the provider's discretion.      For prescription refill requests, have your pharmacy contact our office and allow 72 hours for refills to be completed.    Today you received the following chemotherapy and/or immunotherapy agents: GEMZAR      To help prevent nausea and vomiting after your treatment, we encourage you to take your nausea medication as directed.  BELOW ARE SYMPTOMS THAT SHOULD BE REPORTED IMMEDIATELY: *FEVER GREATER THAN 100.4 F (38 C) OR HIGHER *CHILLS OR SWEATING *NAUSEA AND VOMITING THAT IS NOT CONTROLLED WITH YOUR NAUSEA MEDICATION *UNUSUAL SHORTNESS OF BREATH *UNUSUAL BRUISING OR BLEEDING *URINARY PROBLEMS (pain or burning when urinating, or frequent urination) *BOWEL PROBLEMS (unusual diarrhea, constipation, pain near the anus) TENDERNESS IN MOUTH AND THROAT WITH OR WITHOUT PRESENCE OF ULCERS (sore throat, sores in mouth, or a toothache) UNUSUAL RASH, SWELLING OR PAIN  UNUSUAL VAGINAL DISCHARGE OR ITCHING   Items with * indicate a potential emergency and should be followed up as soon as possible or go to the Emergency Department if any problems should occur.  Please show the CHEMOTHERAPY ALERT CARD or IMMUNOTHERAPY ALERT CARD at check-in to  the Emergency Department and triage nurse.  Should you have questions after your visit or need to cancel or reschedule your appointment, please contact [place] [facility] AT [facility]  [phone] and follow the prompts.  Office hours are 8:00 a.m. to 4:30 p.m. Monday - Friday. Please note that voicemails left after 4:00 p.m. may not be returned until the following business day.  We are closed weekends and major holidays. You have access to a nurse at all times for urgent questions. Please call the main number to the clinic [phone] and follow the prompts.  For any non-urgent questions, you may also contact your provider using MyChart. We now offer e-Visits for anyone 31 and older to request care online for non-urgent symptoms. For details visit [email or web address].   Also download the MyChart app! Go to the app store, search "MyChart", open the app, select [place], and log in with your MyChart username and password.

## 2023-02-19 NOTE — Progress Notes (Signed)
I, Amy L Pierron, acting as a scribe for Vanna Scotland, MD.,have documented all relevant documentation on the behalf of Vanna Scotland, MD,as directed by  Vanna Scotland, MD while in the presence of Vanna Scotland, MD.  02/19/2023 9:08 AM   Jessica Morrison June 24, 1946 [id number]  Referring provider: Marguarite Arbour, MD [address] [facility] [place] [facility] [place],  [place] [address]  Chief Complaint  Patient presents with   Follow-up    Discuss surgery options    HPI: 77 year-old female with a personal history of upper tract urothelial carcinoma presents today to discuss the logistics of surgery.   She initially presented with hematuria. She had a ct urogram on 09/25/2022 indicating a complex cystic mass seen in the left medial upper pole measuring 3.8 by 2.5 with irregular mural thickening. Diagnostic ureteroscopy with Dr. Lonna Cobb in September indicated high grade upper tract urothelial carcinoma. Stent was subsequently removed. PET scan was negative for metastatic disease. She's now under the care of [facility] undergoing Neoadjuvant Gemcitabine Cisplatin. Recently the dose had to be reduced. She just completed her third cycle with plans for a fourth cycle.  She was seen in the emergency room recently for lower extremity edema, which is felt to be CHF related. She also had a rise in her creatinine to 2.02.   She said her fourth cycle of chemo is being done today. She reports that Dr. Orlie Dakin called in Lasix for the edema. It resolved in 2 days. She does not have a history of heart disease or CHF and has been referred to a cardiologist to verify any new cardiology issues.   She is anxious to have surgery.    PMH: Past Medical History:  Diagnosis Date   Back pain    Basal cell carcinoma 08/04/2020   0.5cm above the left mid brow, EDC 09/15/20   Cancer (HCC)    Melanoma   Curvature of spine    Dysplastic nevus 04/30/2013   left post shoulder, left  parasternal   Dysplastic nevus 05/04/2014   right prox ant deltoid, left lat buttock   Dysplastic nevus 03/26/2019   right lat neck adjacent to the inf top of MM in situ site scar   Dysplastic nevus 10/29/2019   upper back left paraspinal at level of mid scapula   Dysplastic nevus 03/31/2020, 05/12/20 shave removal   Left mid pretibial. Moderate to severe atypia, deep margin involved.   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   right lat neck inf to right angle of mandible, excised: 07/29/2018, margins free   Nephrolithiasis    Osteopenia    Osteoporosis    Pelvic pain in female    Raynaud disease    Thyromegaly     Surgical History: Past Surgical History:  Procedure Laterality Date   ABDOMINAL HYSTERECTOMY     BILATERAL OOPHORECTOMY  1999   CESAREAN SECTION     COLONOSCOPY WITH PROPOFOL N/A 05/09/2015   Procedure: COLONOSCOPY WITH PROPOFOL;  Surgeon: Elnita Maxwell, MD;  Location: [facility] ENDOSCOPY;  Service: Endoscopy;  Laterality: N/A;   CYSTOSCOPY WITH  RETROGRADE PYELOGRAM, URETEROSCOPY AND STENT PLACEMENT Left 10/09/2022   Procedure: CYSTOSCOPY WITH RETROGRADE PYELOGRAM, URETEROSCOPY AND STENT PLACEMENT;  Surgeon: Riki Altes, MD;  Location: [facility] ORS;  Service: Urology;  Laterality: Left;   IR IMAGING GUIDED PORT INSERTION  12/06/2022   MELANOMA EXCISION Right 07/29/2018   lateral neck inf. to right angle of mandible   OOPHORECTOMY     URETERAL BIOPSY Left 10/09/2022   Procedure: URETERAL BIOPSY;  Surgeon: Riki Altes, MD;  Location: [facility] ORS;  Service: Urology;  Laterality: Left;    Home Medications:  Allergies as of 02/19/2023       Reactions    Codeine Nausea And Vomiting   Neosporin Wound Cleanser [benzalkonium Chloride] Other (See Comments)   Skin blisters   Penicillins         Medication List        Accurate as of Feb 19, 2023  9:08 AM. If you have any questions, ask your nurse or doctor.          acetaminophen 325 MG tablet Commonly known as: TYLENOL Take 650 mg by mouth every 6 (six) hours as needed.   calcium-vitamin D 500-200 MG-UNIT tablet Commonly known as: OSCAL WITH D 1 tablet daily with breakfast.   Cholecalciferol 25 MCG (1000 UT) tablet Take 1,000 Units by mouth daily.   cyanocobalamin 1000 MCG tablet Take 1,000 mcg by mouth daily.   dexamethasone 4 MG tablet Commonly known as: DECADRON Take 1 tablet (4 mg total) by mouth daily.   donepezil 10 MG tablet Commonly known as: ARICEPT TAKE 1 TABLET BY MOUTH ONCE EVERY EVENING   furosemide 20 MG tablet Commonly known as: LASIX Take one tablet daily for 2 days then as needed once daily   lidocaine-prilocaine cream Commonly known as: EMLA Apply to affected area once   meloxicam 7.5 MG tablet Commonly known as: MOBIC Take 1 tablet by mouth daily.   memantine 5 MG tablet Commonly known as: NAMENDA Take 5 mg by mouth 2 (two) times daily.   omeprazole 40 MG capsule Commonly known as: PRILOSEC 40 mg daily.   ondansetron 8 MG tablet Commonly known as: Zofran Take 1 tablet (8 mg total) by mouth every 8 (eight) hours as needed for nausea or vomiting. Start on the third day after cisplatin.   prochlorperazine 10 MG tablet Commonly known as: COMPAZINE Take 1 tablet (10 mg total) by mouth every 6 (six) hours as needed (Nausea or vomiting).   raloxifene 60 MG tablet Commonly known as: EVISTA TAKE 1 TABLET BY MOUTH ONCE A DAY   rosuvastatin 5 MG tablet Commonly known as: CRESTOR Take 5 mg by mouth daily.        Allergies:  Allergies  Allergen Reactions   Codeine Nausea And Vomiting   Neosporin Wound Cleanser [Benzalkonium Chloride]  Other (See Comments)    Skin blisters   Penicillins     Family History: Family History  Problem Relation Age of Onset   Emphysema Father    Cancer Neg Hx    Heart disease Neg Hx    Diabetes Neg Hx    Breast cancer Neg Hx     Social History:  reports that she has never smoked. She has never been exposed to tobacco smoke. She has never used smokeless tobacco. She reports that she does not drink alcohol and does not use drugs.   Physical Exam: BP (!) 147/78   Pulse 60   Ht 4' 9.5" (1.461 m)   Wt  99 lb 4 oz (45 kg)   BMI 21.11 kg/m   Constitutional:  Alert and oriented, No acute distress.  Companied today by her daughter. HEENT: [place] AT, moist mucus membranes.  Trachea midline, no masses. Neurologic: Grossly intact, no focal deficits, moving all 4 extremities. Psychiatric: Normal mood and affect.   Assessment & Plan:    Upper tract urothelial carcinoma  - She's getting last cycle of of neoadjuvant chemotherapy today. Prefer about 4-6 weeks to recover after this dose before surgery  - Agree with cardiology referral to ensure that she has no underlying cardiac pathology prior to surgery, denies any risk  - Discussed risks and benefits of surgery including worsening renal function, bleeding, infection, and damage to surrounding structures, etc.  - She understands the post-operative course, including lifting restrictions and need for Foley catheter post-op. All questions were answered. Will fill out a booking sheet today and hope to have surgery done in about 6 weeks. -Discussed today, no additional need for upper tract imaging given that she had no evidence of metastatic disease prior to neoadjuvant therapy  2. 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   Vanna Scotland, MD   [facility] [address], [address] [place], [place] [address] [phone]  I spent 42 total minutes on the day of the encounter including pre-visit review of the medical record, face-to-face time with the patient, and post visit ordering of labs/imaging/tests.

## 2023-02-19 NOTE — Progress Notes (Signed)
Jessica Morrison presents for an office visit. BP today is __147/78_________. She is not on BP medication. Greater than 140/90. Provider  notified. Pt advised to___f/u with PCP___________. Pt voiced understanding.

## 2023-02-19 NOTE — Progress Notes (Signed)
[facility]  Telephone:([phone] Fax:(336) [phone]  ID: Jessica Morrison OB: 03-24-1946  MR#: [id number]  XBM#:[id number]  Patient Care Team: Marguarite Arbour, MD as PCP - General (Internal Medicine)  CHIEF COMPLAINT: Urothelial carcinoma of the kidney.  INTERVAL HISTORY: Patient returns to clinic today for further evaluation and consideration of cycle 4, day 8 of cisplatin and gemcitabine.  Gemcitabine only.  She was seen in symptom management clinic in the interim for increased edema which is now resolved.  She has weakness and fatigue.  She has no neurologic complaints.  She denies any recent fevers or illnesses. She has no chest pain, shortness of breath, cough, or hemoptysis.  She denies any nausea, vomiting, constipation, or diarrhea.  She has not no urinary complaints.  Patient offers no further specific complaints today.  REVIEW OF SYSTEMS:   Review of Systems  Constitutional:  Positive for malaise/fatigue. Negative for fever and weight loss.  Respiratory: Negative.  Negative for cough and shortness of breath.   Cardiovascular: Negative.  Negative for chest pain and leg swelling.  Gastrointestinal: Negative.  Negative for abdominal pain.  Genitourinary: Negative.  Negative for hematuria.  Musculoskeletal: Negative.  Negative for back pain.  Skin: Negative.  Negative for rash.  Neurological:  Positive for weakness. Negative for dizziness, focal weakness and headaches.  Psychiatric/Behavioral: Negative.  The patient is not nervous/anxious.     As per HPI. Otherwise, a complete review of systems is negative.  PAST MEDICAL HISTORY: Past Medical History:  Diagnosis Date   Back pain    Basal cell carcinoma 08/04/2020   0.5cm above the left mid brow, EDC 09/15/20   Cancer (HCC)    Melanoma   Curvature of spine    Dysplastic nevus 04/30/2013   left post shoulder, left parasternal   Dysplastic nevus 05/04/2014   right prox ant deltoid, left lat buttock    Dysplastic nevus 03/26/2019   right lat neck adjacent to the inf top of MM in situ site scar   Dysplastic nevus 10/29/2019   upper back left paraspinal at level of mid scapula   Dysplastic nevus 03/31/2020, 05/12/20 shave removal   Left mid pretibial. Moderate to severe atypia, deep margin involved.   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   right lat neck inf to right angle of mandible, excised: 07/29/2018, margins free   Nephrolithiasis    Osteopenia    Osteoporosis    Pelvic pain in female    Raynaud disease    Thyromegaly     PAST SURGICAL HISTORY: Past Surgical History:  Procedure Laterality Date   ABDOMINAL HYSTERECTOMY     BILATERAL OOPHORECTOMY  1999   CESAREAN SECTION     COLONOSCOPY WITH PROPOFOL N/A 05/09/2015   Procedure: COLONOSCOPY WITH PROPOFOL;  Surgeon: Elnita Maxwell, MD;  Location[facility] ENDOSCOPY;  Service: Endoscopy;  Laterality: N/A;   CYSTOSCOPY WITH RETROGRADE PYELOGRAM, URETEROSCOPY AND STENT PLACEMENT Left 10/09/2022   Procedure: CYSTOSCOPY WITH RETROGRADE PYELOGRAM, URETEROSCOPY AND STENT PLACEMENT;  Surgeon: Riki Altes, MD;  Location: [facility] ORS;  Service: Urology;  Laterality: Left;   IR  IMAGING GUIDED PORT INSERTION  12/06/2022   MELANOMA EXCISION Right 07/29/2018   lateral neck inf. to right angle of mandible   OOPHORECTOMY     URETERAL BIOPSY Left 10/09/2022   Procedure: URETERAL BIOPSY;  Surgeon: Riki Altes, MD;  Location: [facility] ORS;  Service: Urology;  Laterality: Left;    FAMILY HISTORY: Family History  Problem Relation Age of Onset   Emphysema Father    Cancer Neg Hx    Heart disease Neg Hx    Diabetes Neg Hx     Breast cancer Neg Hx     ADVANCED DIRECTIVES (Y/N):  N  HEALTH MAINTENANCE: Social History   Tobacco Use   Smoking status: Never    Passive exposure: Never   Smokeless tobacco: Never  Vaping Use   Vaping Use: Never used  Substance Use Topics   Alcohol use: No   Drug use: No     Colonoscopy:  PAP:  Bone density:  Lipid panel:  Allergies  Allergen Reactions   Codeine Nausea And Vomiting   Neosporin Wound Cleanser [Benzalkonium Chloride] Other (See Comments)    Skin blisters   Penicillins     Current Outpatient Medications  Medication Sig Dispense Refill   acetaminophen (TYLENOL) 325 MG tablet Take 650 mg by mouth every 6 (six) hours as needed.     calcium-vitamin D (OSCAL WITH D) 500-200 MG-UNIT tablet 1 tablet daily with breakfast.     Cholecalciferol 25 MCG (1000 UT) tablet Take 1,000 Units by mouth daily.     cyanocobalamin 1000 MCG tablet Take 1,000 mcg by mouth daily.     dexamethasone (DECADRON) 4 MG tablet Take 1 tablet (4 mg total) by mouth daily. 30 tablet 2   donepezil (ARICEPT) 10 MG tablet TAKE 1 TABLET BY MOUTH ONCE EVERY EVENING     furosemide (LASIX) 20 MG tablet Take one tablet daily for 2 days then as needed once daily 30 tablet 0   lidocaine-prilocaine (EMLA) cream Apply to affected area once 30 g 3   meloxicam (MOBIC) 7.5 MG tablet Take 1 tablet by mouth daily.     memantine (NAMENDA) 5 MG tablet Take 5 mg by mouth 2 (two) times daily.     omeprazole (PRILOSEC) 40 MG capsule 40 mg daily.     ondansetron (ZOFRAN) 8 MG tablet Take 1 tablet (8 mg total) by mouth every 8 (eight) hours as needed for nausea or vomiting. Start on the third day after cisplatin. 60 tablet 2   prochlorperazine (COMPAZINE) 10 MG tablet Take 1 tablet (10 mg total) by mouth every 6 (six) hours as needed (Nausea or vomiting). 60 tablet 2   raloxifene (EVISTA) 60 MG tablet TAKE 1 TABLET BY MOUTH ONCE A DAY 90 tablet 1   rosuvastatin (CRESTOR) 5 MG tablet Take 5 mg by mouth daily.      No current facility-administered medications for this visit.   Facility-Administered Medications Ordered in Other Visits  Medication Dose Route Frequency Provider Last Rate Last Admin   sodium chloride flush (NS) 0.9 % injection 10 mL  10 mL Intracatheter PRN Jeralyn Ruths, MD   10 mL at 02/19/23 1131    OBJECTIVE: Vitals:   02/19/23 1023  BP: 128/70  Pulse: (!) 55  Resp: 16  Temp: 97.8 F (36.6 C)  SpO2: 100%     Body mass index is 21.16 kg/m.    ECOG FS:1 - Symptomatic but completely ambulatory  General: Well-developed, well-nourished, no acute distress. Eyes: Pink conjunctiva,  anicteric sclera. HEENT: Normocephalic, moist mucous membranes. Lungs: No audible wheezing or coughing. Heart: Regular rate and rhythm. Abdomen: Soft, nontender, no obvious distention. Musculoskeletal: No edema, cyanosis, or clubbing. Neuro: Alert, answering all questions appropriately. Cranial nerves grossly intact. Skin: No rashes or petechiae noted. Psych: Normal affect.  LAB RESULTS:  Lab Results  Component Value Date   NA 134 (L) 02/19/2023   K 3.2 (L) 02/19/2023   CL 94 (L) 02/19/2023   CO2 27 02/19/2023   GLUCOSE 113 (H) 02/19/2023   BUN 51 (H) 02/19/2023   CREATININE 1.48 (H) 02/19/2023   CALCIUM 8.9 02/19/2023   PROT 5.9 (L) 02/19/2023   ALBUMIN 3.8 02/19/2023   AST 29 02/19/2023   ALT 36 02/19/2023   ALKPHOS 42 02/19/2023   BILITOT 0.6 02/19/2023   GFRNONAA 36 (L) 02/19/2023    Lab Results  Component Value Date   WBC 6.5 02/19/2023   NEUTROABS 5.7 02/19/2023   HGB 10.9 (L) 02/19/2023   HCT 32.3 (L) 02/19/2023   MCV 83.5 02/19/2023   PLT 110 (L) 02/19/2023     STUDIES: No results found.  ASSESSMENT: Urothelial carcinoma of the kidney.  PLAN:    Urothelial carcinoma of the kidney: Confirmed by biopsy.  PET scan results on November 14, 2022 reviewed independently with no significant hypermetabolism of known malignancy.  Imaging does not reveal any  metastatic disease.  After consultation with Dr. Apolinar Junes, it is recommended that patient pursue neoadjuvant chemotherapy followed by surgical resection.  Patient will receive cisplatin and gemcitabine on day 1 with gemcitabine on day 8 and a 21-day cycle.  Plan to give 4 cycles and then refer back to urology for surgical resection.  Proceed with cycle 4, day 8 of treatment today.  Patient had consultation with urology this morning and plan is to proceed with surgery in approximately 6 weeks.  No further chemotherapy is necessary.  Return to clinic in 1 month for repeat laboratory work and further evaluation.   Hematuria: Patient does not complain of this today.  Secondary to malignancy. Renal insufficiency: Creatinine is trended up slightly to 1.48.  Monitor.   Anemia: Chronic and.  Patient's hemoglobin is 10.9.   Thrombocytopenia: Mild.  Proceed with treatment as above. Poor appetite: Improved, continue dexamethasone. Hypokalemia: Potassium 3.2 today.  Patient was previously given dietary recommendations.  Patient expressed understanding and was in agreement with this plan. She also understands that She can call clinic at any time with any questions, concerns, or complaints.    Cancer Staging  Urothelial carcinoma of kidney [facility]) Staging form: Kidney, AJCC 8th Edition - Clinical stage from 12/01/2022: Stage Unknown (cTX, cN0, cM0) - Signed by Jeralyn Ruths, MD on 12/01/2022 Stage prefix: Initial diagnosis Histologic grade (G): G3 Histologic grading system: 4 grade system   Jeralyn Ruths, MD   02/19/2023 12:52 PM

## 2023-02-19 NOTE — Progress Notes (Signed)
Complains of feeling weak and tired. No appetite. Was just seen Friday by Maralyn Sago and had swelling all over per daughter. Only had to take 2 lasix and symptoms subsided.

## 2023-02-20 ENCOUNTER — Telehealth: Payer: Self-pay

## 2023-02-20 ENCOUNTER — Encounter: Payer: Self-pay | Admitting: Dermatology

## 2023-02-20 NOTE — Addendum Note (Signed)
Addended by: Letta Kocher A on: 02/20/2023 11:57 AM   Modules accepted: Orders

## 2023-02-20 NOTE — Progress Notes (Signed)
   [facility] Posting Form  Surgery Date: Date: 04/01/2023  Surgeon: Dr. Vanna Scotland, MD  Inpt ( Yes  )   Outpt (No)   Obs ( No  )   Diagnosis: C64.2 Left Upper Tract Urothelial Carcinoma   -CPT: [address], [phone]  Surgery: Left Robotic Laparoscopic Nephro Ureterectomy with Instillation of Gemcitabine  Stop Anticoagulations: Yes and also hold ASA  Cardiac/Medical/Pulmonary Clearance needed: no  *Orders entered into EPIC  Date: 02/20/23   *Case booked in EPIC  Date: 02/20/23  *Notified pt of Surgery: Date: 02/20/23  PRE-OP UA & CX: yes, will also obtain, CBC, BMP, Type and Cross, INR  *Placed into Prior Authorization Work Que Date: 02/20/23  Assistant/laser/rep:No

## 2023-02-20 NOTE — Telephone Encounter (Signed)
I spoke with Mrs. Girvan and daughter. We have discussed possible surgery dates and Monday June 17th, 2024 was agreed upon by all parties. Patient given information about surgery date, what to expect pre-operatively and post operatively.  We discussed that a Pre-Admission Testing office will be calling to set up the pre-op visit that will take place prior to surgery, and that these appointments are typically done over the phone with a Pre-Admissions RN. Informed patient that our office will communicate any additional care to be provided after surgery. Patients questions or concerns were discussed during our call. Advised to call our office should there be any additional information, questions or concerns that arise. Patient verbalized understanding.

## 2023-02-25 ENCOUNTER — Encounter: Payer: Self-pay | Admitting: Oncology

## 2023-02-26 NOTE — Addendum Note (Signed)
Addended by: Letta Kocher A on: 02/26/2023 02:39 PM   Modules accepted: Orders

## 2023-02-27 DIAGNOSIS — H2513 Age-related nuclear cataract, bilateral: Secondary | ICD-10-CM | POA: Diagnosis not present

## 2023-02-28 ENCOUNTER — Ambulatory Visit: Payer: PPO | Attending: Internal Medicine | Admitting: Internal Medicine

## 2023-02-28 ENCOUNTER — Telehealth: Payer: Self-pay | Admitting: *Deleted

## 2023-02-28 VITALS — BP 112/82 | HR 71 | Ht <= 58 in | Wt 97.0 lb

## 2023-02-28 DIAGNOSIS — M7989 Other specified soft tissue disorders: Secondary | ICD-10-CM | POA: Diagnosis not present

## 2023-02-28 NOTE — Telephone Encounter (Signed)
Incoming fax from [facility]- for RF of lasix 20 mg. Please advise if RF is appropriate.

## 2023-02-28 NOTE — Progress Notes (Signed)
Cardiology Office Note:    Date:  02/28/2023   ID:  Jessica Morrison, DOB 08/14/1946, MRN [id number]  PCP:  Marguarite Arbour, MD   [facility] Providers Cardiologist:  Maisie Fus, MD     Referring MD: Rushie Chestnut, PA-C   No chief complaint on file. anasarca  History of Present Illness:    Jessica Morrison is a 77 y.o. female with a hx of basal cell carcinoma, melanoma, upper tract urothelial carcinoma referral for new onset anasarca including LE edema. Her daughter comes in today and noted she had diffuse edema after chemo. This was the first time this happened after chemo. She took lasix for 2 days, and her swelling resolved. She notes some SOB when walking outside. No CP.  No orthopnea or PND. Legs not swollen today.  Undergoing cisplatin and gemcitabine. Pending robotic assisted laparoscopic nephrouretectomy. No prior cardiac dx hx No premature CAD No hx of smoking  Past Medical History:  Diagnosis Date   Back pain    Basal cell carcinoma 08/04/2020   0.5cm above the left mid brow, EDC 09/15/20   Cancer (HCC)    Melanoma   Curvature of spine    Dysplastic nevus 04/30/2013   left post shoulder, left parasternal   Dysplastic nevus 05/04/2014   right prox ant deltoid, left lat buttock   Dysplastic nevus 03/26/2019   right lat neck adjacent to the inf top of MM in situ site scar   Dysplastic nevus 10/29/2019   upper back left paraspinal at level of mid scapula   Dysplastic nevus 03/31/2020, 05/12/20 shave removal   Left mid pretibial. Moderate to severe atypia, deep margin involved.   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   right lat neck inf  to right angle of mandible, excised: 07/29/2018, margins free   Nephrolithiasis    Osteopenia    Osteoporosis    Pelvic pain in female    Raynaud disease    Thyromegaly     Past Surgical History:  Procedure Laterality Date   ABDOMINAL HYSTERECTOMY     BILATERAL OOPHORECTOMY  1999   CESAREAN SECTION     COLONOSCOPY WITH PROPOFOL N/A 05/09/2015   Procedure: COLONOSCOPY WITH PROPOFOL;  Surgeon: Elnita Maxwell, MD;  Location: [facility] ENDOSCOPY;  Service: Endoscopy;  Laterality: N/A;   CYSTOSCOPY WITH RETROGRADE PYELOGRAM, URETEROSCOPY AND STENT PLACEMENT Left 10/09/2022   Procedure: CYSTOSCOPY WITH RETROGRADE PYELOGRAM, URETEROSCOPY AND STENT PLACEMENT;  Surgeon: Riki Altes, MD;  Location: [facility] ORS;  Service: Urology;  Laterality: Left;   IR IMAGING GUIDED PORT INSERTION  12/06/2022   MELANOMA EXCISION Right 07/29/2018   lateral neck inf. to right angle of mandible   OOPHORECTOMY     URETERAL BIOPSY Left 10/09/2022   Procedure: URETERAL BIOPSY;  Surgeon: Riki Altes, MD;  Location: [facility] ORS;  Service: Urology;  Laterality: Left;    Current Medications: Current Meds  Medication Sig   acetaminophen (TYLENOL) 325 MG tablet Take 650 mg by mouth every 6 (six) hours as needed.   calcium-vitamin D (OSCAL WITH D) 500-200 MG-UNIT tablet 1  tablet daily with breakfast.   Cholecalciferol 25 MCG (1000 UT) tablet Take 1,000 Units by mouth daily.   cyanocobalamin 1000 MCG tablet Take 1,000 mcg by mouth daily.   donepezil (ARICEPT) 10 MG tablet TAKE 1 TABLET BY MOUTH ONCE EVERY EVENING   lidocaine-prilocaine (EMLA) cream Apply to affected area once   meloxicam (MOBIC) 7.5 MG tablet Take 1 tablet by mouth daily.   memantine (NAMENDA) 5 MG tablet Take 5 mg by mouth 2 (two) times daily.   omeprazole (PRILOSEC) 40 MG capsule 40 mg daily.   ondansetron (ZOFRAN) 8 MG tablet Take 1 tablet (8 mg total) by mouth every 8 (eight) hours as needed for nausea or vomiting. Start on the third day after  cisplatin.   prochlorperazine (COMPAZINE) 10 MG tablet Take 1 tablet (10 mg total) by mouth every 6 (six) hours as needed (Nausea or vomiting).   raloxifene (EVISTA) 60 MG tablet TAKE 1 TABLET BY MOUTH ONCE A DAY   rosuvastatin (CRESTOR) 5 MG tablet Take 5 mg by mouth daily.     Allergies:   Codeine, Neosporin wound cleanser [benzalkonium chloride], and Penicillins   Social History   Socioeconomic History   Marital status: Widowed    Spouse name: Not on file   Number of children: Not on file   Years of education: Not on file   Highest education level: Not on file  Occupational History   Not on file  Tobacco Use   Smoking status: Never    Passive exposure: Never   Smokeless tobacco: Never  Vaping Use   Vaping Use: Never used  Substance and Sexual Activity   Alcohol use: No   Drug use: No   Sexual activity: Not Currently    Birth control/protection: Surgical  Other Topics Concern   Not on file  Social History Narrative   Not on file   Social Determinants of Health   Financial Resource Strain: Not on file  Food Insecurity: Not on file  Transportation Needs: Not on file  Physical Activity: Insufficiently Active (08/27/2017)   Exercise Vital Sign    Days of Exercise per Week: 2 days    Minutes of Exercise per Session: 60 min  Stress: Stress Concern Present (08/27/2017)   [employer] of Occupational Health - Occupational Stress Questionnaire    Feeling of Stress : To some extent  Social Connections: Not on file     Family History: The patient's family history includes Emphysema in her father. There is no history of Cancer, Heart disease, Diabetes, or Breast cancer.  ROS:   Please see the history of present illness.     All other systems reviewed and are negative.  EKGs/Labs/Other Studies Reviewed:    The following studies were reviewed today:   EKG:  EKG is  ordered today.  The ekg ordered today demonstrates   02/28/2023- NSR, LAD, minimal LVH  criteria  Recent Labs: 02/12/2023: Magnesium 1.8 02/19/2023: ALT 36; BUN 51; Creatinine 1.48; Hemoglobin 10.9; Platelet Count 110; Potassium 3.2; Sodium 134   Recent Lipid Panel No results found for: "CHOL", "TRIG", "HDL", "CHOLHDL", "VLDL", "LDLCALC", "LDLDIRECT"   Risk Assessment/Calculations:         Physical Exam:    VS:   Vitals:   02/28/23 1112  BP: 112/82  Pulse: 71  SpO2: 95%    Wt Readings from Last 3 Encounters:  02/28/23 97 lb (44 kg)  02/19/23 99 lb 8 oz (45.1 kg)  02/19/23 99 lb 4 oz (45 kg)  GEN:  Well nourished, well developed in no acute distress HEENT: Normal NECK: No JVD; No carotid bruits CARDIAC: RRR, no murmurs, rubs, gallops RESPIRATORY:  Clear to auscultation without rales, wheezing or rhonchi  ABDOMEN: Soft, non-tender, non-distended MUSCULOSKELETAL:  No edema; No deformity  SKIN: Warm and dry NEUROLOGIC:  Alert and oriented x 3 PSYCHIATRIC:  Normal affect   ASSESSMENT:    Anasarca - No signs of CHF today, will get a TTE and BNP to be thorough - EKG is unremarkable  - cisplatin can be associated with HF. Other risk includes MI and venous/arterial thrombosis. No signs of DVT PLAN:    In order of problems listed above:  BNP TTE Follow up 3 months      Medication Adjustments/Labs and Tests Ordered: Current medicines are reviewed at length with the patient today.  Concerns regarding medicines are outlined above.  Orders Placed This Encounter  Procedures   Brain natriuretic peptide   EKG 12-Lead   ECHOCARDIOGRAM COMPLETE   No orders of the defined types were placed in this encounter.   Patient Instructions  Medication Instructions:  No Changes In Medications at this time.   *If you need a refill on your cardiac medications before your next appointment, please call your pharmacy*  Lab Work: BLOOD WORK TODAY  If you have labs (blood work) drawn today and your tests are completely normal, you will receive your results only  by: MyChart Message (if you have MyChart) OR A paper copy in the mail If you have any lab test that is abnormal or we need to change your treatment, we will call you to review the results.  Testing/Procedures: Your physician has requested that you have an echocardiogram. Echocardiography is a painless test that uses sound waves to create images of your heart. It provides your doctor with information about the size and shape of your heart and how well your heart's chambers and valves are working. You may receive an ultrasound enhancing agent through an IV if needed to better visualize your heart during the echo.This procedure takes approximately one hour. There are no restrictions for this procedure. This will take place at the [address], [address].    Follow-Up: At [facility], you and your health needs are our priority.  As part of our continuing mission to provide you with exceptional heart care, we have created designated Provider Care Teams.  These Care Teams include your primary Cardiologist (physician) and Advanced Practice Providers (APPs -  Physician Assistants and Nurse Practitioners) who all work together to provide you with the care you need, when you need it.  Your next appointment:   3 month(s)  Provider:   Maisie Fus, MD      Signed, Maisie Fus, MD  02/28/2023 11:52 AM    [facility]

## 2023-02-28 NOTE — Patient Instructions (Signed)
Medication Instructions:  No Changes In Medications at this time.   *If you need a refill on your cardiac medications before your next appointment, please call your pharmacy*  Lab Work: BLOOD WORK TODAY  If you have labs (blood work) drawn today and your tests are completely normal, you will receive your results only by: MyChart Message (if you have MyChart) OR A paper copy in the mail If you have any lab test that is abnormal or we need to change your treatment, we will call you to review the results.  Testing/Procedures: Your physician has requested that you have an echocardiogram. Echocardiography is a painless test that uses sound waves to create images of your heart. It provides your doctor with information about the size and shape of your heart and how well your heart's chambers and valves are working. You may receive an ultrasound enhancing agent through an IV if needed to better visualize your heart during the echo.This procedure takes approximately one hour. There are no restrictions for this procedure. This will take place at the [address].    Follow-Up: At [facility], you and your health needs are our priority.  As part of our continuing mission to provide you with exceptional heart care, we have created designated Provider Care Teams.  These Care Teams include your primary Cardiologist (physician) and Advanced Practice Providers (APPs -  Physician Assistants and Nurse Practitioners) who all work together to provide you with the care you need, when you need it.  Your next appointment:   3 month(s)  Provider:   Maisie Fus, MD

## 2023-03-01 ENCOUNTER — Encounter: Payer: Self-pay | Admitting: Oncology

## 2023-03-01 ENCOUNTER — Other Ambulatory Visit: Payer: Self-pay | Admitting: *Deleted

## 2023-03-01 LAB — BRAIN NATRIURETIC PEPTIDE: BNP: 108.2 pg/mL — ABNORMAL HIGH (ref 0.0–100.0)

## 2023-03-01 MED ORDER — FUROSEMIDE 20 MG PO TABS: ORAL_TABLET | ORAL | 0 refills | Status: DC

## 2023-03-13 ENCOUNTER — Ambulatory Visit: Payer: PPO | Admitting: Urology

## 2023-03-16 DIAGNOSIS — C642 Malignant neoplasm of left kidney, except renal pelvis: Secondary | ICD-10-CM

## 2023-03-16 HISTORY — DX: Malignant neoplasm of left kidney, except renal pelvis: C64.2

## 2023-03-18 ENCOUNTER — Other Ambulatory Visit: Payer: Self-pay

## 2023-03-18 DIAGNOSIS — C642 Malignant neoplasm of left kidney, except renal pelvis: Secondary | ICD-10-CM

## 2023-03-19 ENCOUNTER — Inpatient Hospital Stay: Payer: PPO | Admitting: Oncology

## 2023-03-19 ENCOUNTER — Inpatient Hospital Stay: Payer: PPO | Attending: Oncology

## 2023-03-19 ENCOUNTER — Encounter: Payer: Self-pay | Admitting: Oncology

## 2023-03-19 VITALS — BP 134/67 | HR 77 | Temp 98.6°F | Wt 100.1 lb

## 2023-03-19 DIAGNOSIS — Z95828 Presence of other vascular implants and grafts: Secondary | ICD-10-CM

## 2023-03-19 DIAGNOSIS — C642 Malignant neoplasm of left kidney, except renal pelvis: Secondary | ICD-10-CM

## 2023-03-19 DIAGNOSIS — E876 Hypokalemia: Secondary | ICD-10-CM | POA: Insufficient documentation

## 2023-03-19 LAB — CMP (CANCER CENTER ONLY)
ALT: 15 U/L (ref 0–44)
AST: 22 U/L (ref 15–41)
Albumin: 3.6 g/dL (ref 3.5–5.0)
Alkaline Phosphatase: 41 U/L (ref 38–126)
Anion gap: 8 (ref 5–15)
BUN: 37 mg/dL — ABNORMAL HIGH (ref 8–23)
CO2: 27 mmol/L (ref 22–32)
Calcium: 8.9 mg/dL (ref 8.9–10.3)
Chloride: 106 mmol/L (ref 98–111)
Creatinine: 1.26 mg/dL — ABNORMAL HIGH (ref 0.44–1.00)
GFR, Estimated: 44 mL/min — ABNORMAL LOW (ref >60)
Glucose, Bld: 108 mg/dL — ABNORMAL HIGH (ref 70–99)
Potassium: 3.4 mmol/L — ABNORMAL LOW (ref 3.5–5.1)
Sodium: 141 mmol/L (ref 135–145)
Total Bilirubin: 0.5 mg/dL (ref 0.3–1.2)
Total Protein: 5.8 g/dL — ABNORMAL LOW (ref 6.5–8.1)

## 2023-03-19 LAB — CBC (CANCER CENTER ONLY)
HCT: 28.6 % — ABNORMAL LOW (ref 36.0–46.0)
Hemoglobin: 9.4 g/dL — ABNORMAL LOW (ref 12.0–15.0)
MCH: 29.7 pg (ref 26.0–34.0)
MCHC: 32.9 g/dL (ref 30.0–36.0)
MCV: 90.5 fL (ref 80.0–100.0)
Platelet Count: 143 10*3/uL — ABNORMAL LOW (ref 150–400)
RBC: 3.16 MIL/uL — ABNORMAL LOW (ref 3.87–5.11)
RDW: 18.7 % — ABNORMAL HIGH (ref 11.5–15.5)
WBC Count: 7.3 10*3/uL (ref 4.0–10.5)
nRBC: 0 % (ref 0.0–0.2)

## 2023-03-19 MED ORDER — SODIUM CHLORIDE 0.9% FLUSH
10.0000 mL | Freq: Once | INTRAVENOUS | Status: AC
  Administered 2023-03-19: 10 mL via INTRAVENOUS
  Filled 2023-03-19: qty 10

## 2023-03-19 MED ORDER — HEPARIN SOD (PORK) LOCK FLUSH 100 UNIT/ML IV SOLN
500.0000 [IU] | Freq: Once | INTRAVENOUS | Status: AC
  Administered 2023-03-19: 500 [IU] via INTRAVENOUS
  Filled 2023-03-19: qty 5

## 2023-03-19 NOTE — Progress Notes (Signed)
[facility]  Telephone:(336) [phone] [phone]) [phone]  ID: Jessica Morrison OB: 1946-07-02  MR#: [id number]  WNU#:[id number]  Patient Care Team: Marguarite Arbour, MD as PCP - General (Internal Medicine) Maisie Fus, MD as PCP - Cardiology (Cardiology)  CHIEF COMPLAINT: Urothelial carcinoma of the kidney.  INTERVAL HISTORY: Patient returns to clinic today for 1 month follow-up after completing her chemotherapy.  Her surgery is scheduled for April 01, 2023.  Patient's daughter refutes an acute cognitive decline approximately 3 weeks ago.  She reports her mother was okay one day and the following morning had a significantly decreased in memory.  She otherwise feels well.  She has no neurologic complaints.  She denies any recent fevers or illnesses. She has no chest pain, shortness of breath, cough, or hemoptysis.  She denies any nausea, vomiting, constipation, or diarrhea.  She has not no urinary complaints.  Patient offers no further specific complaints today.    REVIEW OF SYSTEMS:   Review of Systems  Constitutional: Negative.  Negative for fever, malaise/fatigue and weight loss.  Respiratory: Negative.  Negative for cough and shortness of breath.   Cardiovascular: Negative.  Negative for chest pain and leg swelling.  Gastrointestinal: Negative.  Negative for abdominal pain.  Genitourinary: Negative.  Negative for hematuria.  Musculoskeletal: Negative.  Negative for back pain.  Skin: Negative.  Negative for rash.  Neurological: Negative.  Negative for dizziness, focal weakness, weakness and headaches.  Psychiatric/Behavioral:  Positive for memory loss. The patient is not nervous/anxious.     As per HPI. Otherwise, a complete review of systems is negative.  PAST MEDICAL HISTORY: Past Medical History:  Diagnosis Date   Back pain    Basal cell carcinoma 08/04/2020   0.5cm above the left mid brow, EDC 09/15/20   Cancer (HCC)    Melanoma   Curvature of spine     Dysplastic nevus 04/30/2013   left post shoulder, left parasternal   Dysplastic nevus 05/04/2014   right prox ant deltoid, left lat buttock   Dysplastic nevus 03/26/2019   right lat neck adjacent to the inf top of MM in situ site scar   Dysplastic nevus 10/29/2019   upper back left paraspinal at level of mid scapula   Dysplastic nevus 03/31/2020, 05/12/20 shave removal   Left mid pretibial. Moderate to severe atypia, deep margin involved.   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   right lat neck inf to right angle of mandible, excised: 07/29/2018, margins free   Nephrolithiasis    Osteopenia    Osteoporosis    Pelvic pain in female    Raynaud disease    Thyromegaly     PAST SURGICAL HISTORY: Past Surgical History:  Procedure Laterality Date   ABDOMINAL HYSTERECTOMY     BILATERAL OOPHORECTOMY  1999   CESAREAN SECTION     COLONOSCOPY WITH PROPOFOL N/A 05/09/2015   Procedure: COLONOSCOPY WITH PROPOFOL;  Surgeon: Elnita Maxwell, MD;  Location: [facility] ENDOSCOPY;  Service: Endoscopy;  Laterality: N/A;   CYSTOSCOPY WITH RETROGRADE PYELOGRAM, URETEROSCOPY AND STENT PLACEMENT Left 10/09/2022   Procedure: CYSTOSCOPY WITH RETROGRADE  PYELOGRAM, URETEROSCOPY AND STENT PLACEMENT;  Surgeon: Riki Altes, MD;  Location: [facility] ORS;  Service: Urology;  Laterality: Left;   IR IMAGING GUIDED PORT INSERTION  12/06/2022   MELANOMA EXCISION Right 07/29/2018   lateral neck inf. to right angle of mandible   OOPHORECTOMY     URETERAL BIOPSY Left 10/09/2022   Procedure: URETERAL BIOPSY;  Surgeon: Riki Altes, MD;  Location: [facility] ORS;  Service: Urology;  Laterality: Left;    FAMILY  HISTORY: Family History  Problem Relation Age of Onset   Emphysema Father    Cancer Neg Hx    Heart disease Neg Hx    Diabetes Neg Hx    Breast cancer Neg Hx     ADVANCED DIRECTIVES (Y/N):  N  HEALTH MAINTENANCE: Social History   Tobacco Use   Smoking status: Never    Passive exposure: Never   Smokeless tobacco: Never  Vaping Use   Vaping Use: Never used  Substance Use Topics   Alcohol use: No   Drug use: No     Colonoscopy:  PAP:  Bone density:  Lipid panel:  Allergies  Allergen Reactions   Codeine Nausea And Vomiting   Neosporin Wound Cleanser [Benzalkonium Chloride] Other (See Comments)    Skin blisters   Penicillins     Current Outpatient Medications  Medication Sig Dispense Refill   acetaminophen (TYLENOL) 325 MG tablet Take 650 mg by mouth every 6 (six) hours as needed.     calcium-vitamin D (OSCAL WITH D) 500-200 MG-UNIT tablet 1 tablet daily with breakfast.     Cholecalciferol 25 MCG (1000 UT) tablet Take 1,000 Units by mouth daily.     cyanocobalamin 1000 MCG tablet Take 1,000 mcg by mouth daily.     donepezil (ARICEPT) 10 MG tablet TAKE 1 TABLET BY MOUTH ONCE EVERY EVENING     furosemide (LASIX) 20 MG tablet Take one tablet daily for 2 days then as needed once daily 30 tablet 0   lidocaine-prilocaine (EMLA) cream Apply to affected area once 30 g 3   meloxicam (MOBIC) 7.5 MG tablet Take 1 tablet by mouth daily.     memantine (NAMENDA) 5 MG tablet Take 5 mg by mouth 2 (two) times daily.     omeprazole (PRILOSEC) 40 MG capsule 40 mg daily.     ondansetron (ZOFRAN) 8 MG tablet Take 1 tablet (8 mg total) by mouth every 8 (eight) hours as needed for nausea or vomiting. Start on the third day after cisplatin. 60 tablet 2   prochlorperazine (COMPAZINE) 10 MG tablet Take 1 tablet (10 mg total) by mouth every 6 (six) hours as needed (Nausea or vomiting). 60 tablet 2   raloxifene (EVISTA) 60 MG tablet TAKE 1 TABLET BY MOUTH ONCE A DAY 90 tablet 1   rosuvastatin  (CRESTOR) 5 MG tablet Take 5 mg by mouth daily.     dexamethasone (DECADRON) 4 MG tablet Take 1 tablet (4 mg total) by mouth daily. (Patient not taking: Reported on 02/28/2023) 30 tablet 2   No current facility-administered medications for this visit.    OBJECTIVE: Vitals:   03/19/23 1053  BP: 134/67  Pulse: 77  Temp: 98.6 F (37 C)  SpO2: 100%     Body mass index is 21.66 kg/m.    ECOG FS:1 - Symptomatic but completely ambulatory  General: Well-developed, well-nourished, no acute distress. Eyes: Pink conjunctiva, anicteric sclera. HEENT: Normocephalic, moist mucous membranes. Lungs: No audible wheezing or coughing. Heart: Regular rate and rhythm. Abdomen:  Soft, nontender, no obvious distention. Musculoskeletal: No edema, cyanosis, or clubbing. Neuro: Alert, answering all questions appropriately. Cranial nerves grossly intact. Skin: No rashes or petechiae noted. Psych: Normal affect.  LAB RESULTS:  Lab Results  Component Value Date   NA 141 03/19/2023   K 3.4 (L) 03/19/2023   CL 106 03/19/2023   CO2 27 03/19/2023   GLUCOSE 108 (H) 03/19/2023   BUN 37 (H) 03/19/2023   CREATININE 1.26 (H) 03/19/2023   CALCIUM 8.9 03/19/2023   PROT 5.8 (L) 03/19/2023   ALBUMIN 3.6 03/19/2023   AST 22 03/19/2023   ALT 15 03/19/2023   ALKPHOS 41 03/19/2023   BILITOT 0.5 03/19/2023   GFRNONAA 44 (L) 03/19/2023    Lab Results  Component Value Date   WBC 7.3 03/19/2023   NEUTROABS 5.7 02/19/2023   HGB 9.4 (L) 03/19/2023   HCT 28.6 (L) 03/19/2023   MCV 90.5 03/19/2023   PLT 143 (L) 03/19/2023     STUDIES: No results found.  ASSESSMENT: Urothelial carcinoma of the kidney.  PLAN:    Urothelial carcinoma of the kidney: Confirmed by biopsy.  PET scan results on November 14, 2022 reviewed independently with no significant hypermetabolism of known malignancy.  Imaging does not reveal any metastatic disease.  Patient completed 4 cycles of neoadjuvant cisplatin and gemcitabine on Feb 19, 2023.  She has surgical resection scheduled for April 01, 2023.  Return to clinic at the end of July for repeat laboratory work and further evaluation.  Memory loss: By report acute onset.  Will get MRI of the brain to further evaluate.  Patient was also given a referral back to her primary neurologist. Renal insufficiency: Chronic and unchanged.  Patient's most recent creatinine is 1.26. Hypokalemia: Mild, monitor. Anemia: Patient's hemoglobin has trended down to 9.4, monitor. Thrombocytopenia: Improved.  Patient's platelet count is 143 today.   Patient expressed understanding and was in agreement with this plan. She also understands that She can call clinic at any time with any questions, concerns, or complaints.    Cancer Staging  Urothelial carcinoma of kidney [facility]) Staging form: Kidney, AJCC 8th Edition - Clinical stage from 12/01/2022: Stage Unknown (cTX, cN0, cM0) - Signed by Jeralyn Ruths, MD on 12/01/2022 Stage prefix: Initial diagnosis Histologic grade (G): G3 Histologic grading system: 4 grade system   Jeralyn Ruths, MD   03/19/2023 12:10 PM

## 2023-03-19 NOTE — Progress Notes (Signed)
Patient's spouse mentioned that she wanted to discuss the patients cognitive state which changed drastically since May15th.

## 2023-03-20 ENCOUNTER — Ambulatory Visit: Payer: PPO | Admitting: Urology

## 2023-03-25 ENCOUNTER — Other Ambulatory Visit: Payer: Self-pay | Admitting: Oncology

## 2023-03-25 ENCOUNTER — Encounter
Admission: RE | Admit: 2023-03-25 | Discharge: 2023-03-25 | Disposition: A | Discharge status: Home / Self Care | Payer: PPO | Source: Ambulatory Visit | Attending: Urology | Admitting: Urology

## 2023-03-25 HISTORY — DX: Unspecified osteoarthritis, unspecified site: M19.90

## 2023-03-25 HISTORY — DX: Deficiency of other specified B group vitamins: E53.8

## 2023-03-25 HISTORY — DX: Other amnesia: R41.3

## 2023-03-25 HISTORY — DX: Nausea with vomiting, unspecified: Z98.890

## 2023-03-25 HISTORY — DX: Other specified postprocedural states: R11.2

## 2023-03-25 HISTORY — DX: Prediabetes: R73.03

## 2023-03-25 NOTE — Patient Instructions (Addendum)
Your procedure is scheduled on: Monday, June 17 Report to the Registration Desk on the 1st floor of the [facility] To find out your arrival time, please call [phone] between 1PM - 3PM on: Friday, June 14 If your arrival time is 6:00 am, do not arrive before that time as the [facility] entrance doors do not open until 6:00 am.  REMEMBER: Instructions that are not followed completely may result in serious medical risk, up to and including death; or upon the discretion of your surgeon and anesthesiologist your surgery may need to be rescheduled.  Do not eat or drink after midnight the night before surgery.  No gum chewing or hard candies.  Per Dr. Delana Meyer orders: CLEAR LIQUIDS ENTIRE DAY PRIOR TO SURGERY   One week prior to surgery: starting June 10 Stop meloxicam and Anti-inflammatories (NSAIDS) such as Advil, Aleve, Ibuprofen, Motrin, Naproxen, Naprosyn and Aspirin based products such as Excedrin, Goody's Powder, BC Powder. Stop ANY OVER THE COUNTER supplements until after surgery. Stop calcium, vitamin D, vitamin B You may however, continue to take Tylenol if needed for pain up until the day of surgery.  Continue taking all prescribed medications   TAKE ONLY THESE MEDICATIONS THE MORNING OF SURGERY WITH A SIP OF WATER:  Omeprazole (Prilosec) - (take one the night before and one on the morning of surgery - helps to prevent nausea after surgery.) Rosuvastatin (Crestor) memantine (NAMENDA)   No Alcohol for 24 hours before or after surgery.  No Smoking including e-cigarettes for 24 hours before surgery.  No chewable tobacco products for at least 6 hours before surgery.  No nicotine patches on the day of surgery.  Do not use any "recreational" drugs for at least a week (preferably 2 weeks) before your surgery.  Please be advised that the combination of cocaine and anesthesia may have negative outcomes, up to and including death. If you test positive for cocaine, your  surgery will be cancelled.  On the morning of surgery brush your teeth with toothpaste and water, you may rinse your mouth with mouthwash if you wish. Do not swallow any toothpaste or mouthwash.  Use CHG Soap as directed on instruction sheet.  Do not wear jewelry, make-up, hairpins, clips or nail polish.  Do not wear lotions, powders, or perfumes.   Do not shave body hair from the neck down 48 hours before surgery.  Contact lenses, hearing aids and dentures may not be worn into surgery.  Do not bring valuables to the hospital. [facility] is not responsible for any missing/lost belongings or valuables.   Notify your doctor if there is any change in your medical condition (cold, fever, infection).  Wear comfortable clothing (specific to your surgery type) to the hospital.  After surgery, you can help prevent lung complications by doing breathing exercises.  Take deep breaths and cough every 1-2 hours. Your doctor may order a device called an Incentive Spirometer to help you take deep breaths. When coughing or sneezing, hold a pillow firmly against your incision with both hands. This is called "splinting." Doing this helps protect your incision. It also decreases belly discomfort.  If you are being admitted to the hospital overnight, leave your suitcase in the car. After surgery it may be brought to your room.  If you are being discharged the day of surgery, you will not be allowed to drive home. You will need a responsible individual to drive you home and stay with you for 24 hours after surgery.  If you are taking public transportation, you will need to have a responsible individual with you.  Please call the Pre-admissions Testing Dept. at [phone] if you have any questions about these instructions.  Surgery Visitation Policy:  Patients having surgery or a procedure may have two visitors.  Children under the age of 39 must have an adult with them who is not the  patient.  Inpatient Visitation:    Visiting hours are 7 a.m. to 8 p.m. Up to four visitors are allowed at one time in a patient room. The visitors may rotate out with other people during the day.  One visitor age 24 or older may stay with the patient overnight and must be in the room by 8 p.m.     Preparing for Surgery with CHLORHEXIDINE GLUCONATE (CHG) Soap  Chlorhexidine Gluconate (CHG) Soap  o An antiseptic cleaner that kills germs and bonds with the skin to continue killing germs even after washing  o Used for showering the night before surgery and morning of surgery  Before surgery, you can play an important role by reducing the number of germs on your skin.  CHG (Chlorhexidine gluconate) soap is an antiseptic cleanser which kills germs and bonds with the skin to continue killing germs even after washing.  Please do not use if you have an allergy to CHG or antibacterial soaps. If your skin becomes reddened/irritated stop using the CHG.  1. Shower the NIGHT BEFORE SURGERY and the MORNING OF SURGERY with CHG soap.  2. If you choose to wash your hair, wash your hair first as usual with your normal shampoo.  3. After shampooing, rinse your hair and body thoroughly to remove the shampoo.  4. Use CHG as you would any other liquid soap. You can apply CHG directly to the skin and wash gently with a scrungie or a clean washcloth.  5. Apply the CHG soap to your body only from the neck down. Do not use on open wounds or open sores. Avoid contact with your eyes, ears, mouth, and genitals (private parts). Wash face and genitals (private parts) with your normal soap.  6. Wash thoroughly, paying special attention to the area where your surgery will be performed.  7. Thoroughly rinse your body with warm water.  8. Do not shower/wash with your normal soap after using and rinsing off the CHG soap.  9. Pat yourself dry with a clean towel.  10. Wear clean pajamas to bed the night before  surgery.  12. Place clean sheets on your bed the night of your first shower and do not sleep with pets.  13. Shower again with the CHG soap on the day of surgery prior to arriving at the hospital.  14. Do not apply any deodorants/lotions/powders.  15. Please wear clean clothes to the hospital.   Clear Liquid Diet, Adult A clear liquid diet is a diet that includes only liquids and semi-liquids that you can see through. No solid food is eaten on this diet. Most people only need to follow this diet for a short time. A person may need to follow a clear liquid diet if he or she: Develops a medical condition right before or after having surgery. Is not able to eat food for a long period of time. Had nausea, vomiting, or diarrhea. Is going to have a procedure or exam to look at parts of the digestive system. Is going to have bowel surgery. What are the goals of this diet? The usual goals  of this diet are: To rest the stomach and digestive system as much as possible. To help clear the digestive system before a procedure or exam. To keep you hydrated. To make sure you get some calories for energy. To help you return to a normal eating routine. What are tips for following this plan? A clear liquid is a liquid or semi-liquid, such as gelatin, that you can see through when you hold it up to a light. A clear liquid diet does not provide all the nutrients that you need. It is important to choose a variety of the liquids or semi-liquids that are allowed on this diet. That way, you will get as many nutrients as possible. If you are not sure whether you can have certain items, ask your health care provider. If you have difficulty swallowing a thin liquid, you will need to thicken it to prevent breathing it in (aspiration). What foods should I eat?  Water and flavored water. Fruit juices that do not have pulp, such as cranberry juice, apple juice, or grape juice. Tea and coffee without milk or  cream. Clear bouillon or broth. Broth-based soups that have been strained. Flavored gelatin. Honey. Sugar water. Ice or frozen ice pops that do not contain milk, yogurt, fruit pieces, or fruit pulp. Clear sodas. Clear sports drinks. The items listed above may not be a complete list of foods and beverages you can eat. Contact a dietitian for more information. What food should I avoid? Juices that have pulp. Milk. Cream or cream-based soups. Yogurt. Solid foods that are not clear liquids or semi-liquids. The items listed above may not be a complete list of foods and beverages you should avoid. Contact a dietitian for more information. Questions to ask your health care provider: How long do I need to follow this diet? Are there any medicines that I should change while on this diet? Summary A clear liquid diet is a diet that includes liquids and semi-liquids that you can see through. The goal of this diet is to help you recover by resting your digestive system, keeping you hydrated, and providing nutrients. Avoid liquids with milk, cream, or pulp while on this diet. This information is not intended to replace advice given to you by your health care provider. Make sure you discuss any questions you have with your health care provider. Document Revised: 07/19/2020 Document Reviewed: 07/19/2020 Elsevier Patient Education  2023 [employer].

## 2023-03-27 ENCOUNTER — Encounter: Payer: Self-pay | Admitting: Urgent Care

## 2023-03-27 ENCOUNTER — Ambulatory Visit (HOSPITAL_COMMUNITY): Payer: PPO | Attending: Internal Medicine

## 2023-03-27 ENCOUNTER — Encounter
Admission: RE | Admit: 2023-03-27 | Discharge: 2023-03-27 | Disposition: A | Discharge status: Home / Self Care | Payer: PPO | Source: Ambulatory Visit | Attending: Urology | Admitting: Urology

## 2023-03-27 DIAGNOSIS — M7989 Other specified soft tissue disorders: Secondary | ICD-10-CM | POA: Insufficient documentation

## 2023-03-27 DIAGNOSIS — Z01812 Encounter for preprocedural laboratory examination: Secondary | ICD-10-CM | POA: Diagnosis not present

## 2023-03-27 DIAGNOSIS — R600 Localized edema: Secondary | ICD-10-CM

## 2023-03-27 DIAGNOSIS — I340 Nonrheumatic mitral (valve) insufficiency: Secondary | ICD-10-CM | POA: Insufficient documentation

## 2023-03-27 DIAGNOSIS — C642 Malignant neoplasm of left kidney, except renal pelvis: Secondary | ICD-10-CM | POA: Diagnosis not present

## 2023-03-27 LAB — URINALYSIS, COMPLETE (UACMP) WITH MICROSCOPIC
Bacteria, UA: NONE SEEN
Bilirubin Urine: NEGATIVE
Glucose, UA: NEGATIVE mg/dL
Ketones, ur: NEGATIVE mg/dL
Leukocytes,Ua: NEGATIVE
Nitrite: NEGATIVE
Protein, ur: 100 mg/dL — AB
RBC / HPF: >50 RBC/hpf (ref 0–5)
Specific Gravity, Urine: 1.019 (ref 1.005–1.030)
pH: 5 (ref 5.0–8.0)

## 2023-03-27 LAB — PROTIME-INR
INR: 1 (ref 0.8–1.2)
Prothrombin Time: 13.8 seconds (ref 11.4–15.2)

## 2023-03-27 LAB — CBC
HCT: 33.7 % — ABNORMAL LOW (ref 36.0–46.0)
Hemoglobin: 10.5 g/dL — ABNORMAL LOW (ref 12.0–15.0)
MCH: 29.2 pg (ref 26.0–34.0)
MCHC: 31.2 g/dL (ref 30.0–36.0)
MCV: 93.6 fL (ref 80.0–100.0)
Platelets: 158 10*3/uL (ref 150–400)
RBC: 3.6 MIL/uL — ABNORMAL LOW (ref 3.87–5.11)
RDW: 18.3 % — ABNORMAL HIGH (ref 11.5–15.5)
WBC: 8.1 10*3/uL (ref 4.0–10.5)
nRBC: 0 % (ref 0.0–0.2)

## 2023-03-27 LAB — BASIC METABOLIC PANEL
Anion gap: 10 (ref 5–15)
BUN: 36 mg/dL — ABNORMAL HIGH (ref 8–23)
CO2: 24 mmol/L (ref 22–32)
Calcium: 9 mg/dL (ref 8.9–10.3)
Chloride: 107 mmol/L (ref 98–111)
Creatinine, Ser: 1.29 mg/dL — ABNORMAL HIGH (ref 0.44–1.00)
GFR, Estimated: 43 mL/min — ABNORMAL LOW (ref >60)
Glucose, Bld: 110 mg/dL — ABNORMAL HIGH (ref 70–99)
Potassium: 4.1 mmol/L (ref 3.5–5.1)
Sodium: 141 mmol/L (ref 135–145)

## 2023-03-27 LAB — ECHOCARDIOGRAM COMPLETE
Area-P 1/2: 3.75 cm2
S' Lateral: 2.5 cm

## 2023-03-28 LAB — URINE CULTURE

## 2023-03-29 ENCOUNTER — Ambulatory Visit
Admission: RE | Admit: 2023-03-29 | Discharge: 2023-03-29 | Disposition: A | Discharge status: Home / Self Care | Payer: PPO | Source: Ambulatory Visit | Attending: Oncology | Admitting: Oncology

## 2023-03-29 DIAGNOSIS — C642 Malignant neoplasm of left kidney, except renal pelvis: Secondary | ICD-10-CM

## 2023-03-29 DIAGNOSIS — R413 Other amnesia: Secondary | ICD-10-CM | POA: Diagnosis not present

## 2023-03-29 MED ORDER — GADOBUTROL 1 MMOL/ML IV SOLN
5.0000 mL | Freq: Once | INTRAVENOUS | Status: AC | PRN
  Administered 2023-03-29: 5 mL via INTRAVENOUS

## 2023-03-31 MED ORDER — CEFAZOLIN SODIUM-DEXTROSE 2-4 GM/100ML-% IV SOLN
2.0000 g | INTRAVENOUS | Status: AC
  Administered 2023-04-01: 1 g via INTRAVENOUS

## 2023-03-31 MED ORDER — LACTATED RINGERS IV SOLN: INTRAVENOUS | Status: DC

## 2023-03-31 MED ORDER — CHLORHEXIDINE GLUCONATE 0.12 % MT SOLN
15.0000 mL | Freq: Once | OROMUCOSAL | Status: AC
  Administered 2023-04-01: 15 mL via OROMUCOSAL

## 2023-03-31 MED ORDER — GEMCITABINE CHEMO FOR BLADDER INSTILLATION 2000 MG
2000.0000 mg | Freq: Once | INTRAVENOUS | Status: DC
  Filled 2023-03-31: qty 52.6

## 2023-03-31 MED ORDER — ORAL CARE MOUTH RINSE: 15.0000 mL | Freq: Once | OROMUCOSAL | Status: AC

## 2023-04-01 ENCOUNTER — Inpatient Hospital Stay: Payer: PPO | Admitting: Urgent Care

## 2023-04-01 ENCOUNTER — Encounter: Payer: Self-pay | Admitting: Urology

## 2023-04-01 ENCOUNTER — Other Ambulatory Visit: Payer: Self-pay

## 2023-04-01 ENCOUNTER — Inpatient Hospital Stay
Admission: RE | Admit: 2023-04-01 | Discharge: 2023-04-02 | DRG: 658 | Disposition: A | Discharge status: Home / Self Care | Payer: PPO | Attending: Urology | Admitting: Urology

## 2023-04-01 ENCOUNTER — Encounter
Admission: RE | Disposition: A | Discharge status: Home / Self Care | Payer: Self-pay | Source: Home / Self Care | Attending: Urology

## 2023-04-01 DIAGNOSIS — C688 Malignant neoplasm of overlapping sites of urinary organs: Secondary | ICD-10-CM | POA: Diagnosis not present

## 2023-04-01 DIAGNOSIS — Z79899 Other long term (current) drug therapy: Secondary | ICD-10-CM | POA: Diagnosis not present

## 2023-04-01 DIAGNOSIS — Z880 Allergy status to penicillin: Secondary | ICD-10-CM | POA: Diagnosis not present

## 2023-04-01 DIAGNOSIS — I129 Hypertensive chronic kidney disease with stage 1 through stage 4 chronic kidney disease, or unspecified chronic kidney disease: Secondary | ICD-10-CM | POA: Diagnosis not present

## 2023-04-01 DIAGNOSIS — Z85820 Personal history of malignant melanoma of skin: Secondary | ICD-10-CM | POA: Diagnosis not present

## 2023-04-01 DIAGNOSIS — I7300 Raynaud's syndrome without gangrene: Secondary | ICD-10-CM | POA: Diagnosis present

## 2023-04-01 DIAGNOSIS — Z825 Family history of asthma and other chronic lower respiratory diseases: Secondary | ICD-10-CM | POA: Diagnosis not present

## 2023-04-01 DIAGNOSIS — N183 Chronic kidney disease, stage 3 unspecified: Secondary | ICD-10-CM | POA: Diagnosis not present

## 2023-04-01 DIAGNOSIS — K219 Gastro-esophageal reflux disease without esophagitis: Secondary | ICD-10-CM | POA: Diagnosis present

## 2023-04-01 DIAGNOSIS — Z85828 Personal history of other malignant neoplasm of skin: Secondary | ICD-10-CM | POA: Diagnosis not present

## 2023-04-01 DIAGNOSIS — C652 Malignant neoplasm of left renal pelvis: Secondary | ICD-10-CM | POA: Diagnosis not present

## 2023-04-01 DIAGNOSIS — E785 Hyperlipidemia, unspecified: Secondary | ICD-10-CM | POA: Diagnosis not present

## 2023-04-01 DIAGNOSIS — C689 Malignant neoplasm of urinary organ, unspecified: Secondary | ICD-10-CM | POA: Diagnosis not present

## 2023-04-01 DIAGNOSIS — Z9221 Personal history of antineoplastic chemotherapy: Secondary | ICD-10-CM | POA: Diagnosis not present

## 2023-04-01 DIAGNOSIS — M810 Age-related osteoporosis without current pathological fracture: Secondary | ICD-10-CM | POA: Diagnosis present

## 2023-04-01 DIAGNOSIS — Z888 Allergy status to other drugs, medicaments and biological substances status: Secondary | ICD-10-CM | POA: Diagnosis not present

## 2023-04-01 DIAGNOSIS — Z791 Long term (current) use of non-steroidal anti-inflammatories (NSAID): Secondary | ICD-10-CM

## 2023-04-01 DIAGNOSIS — C642 Malignant neoplasm of left kidney, except renal pelvis: Secondary | ICD-10-CM

## 2023-04-01 HISTORY — PX: BLADDER INSTILLATION: SHX6893

## 2023-04-01 HISTORY — PX: ROBOT ASSITED LAPAROSCOPIC NEPHROURETERECTOMY: SHX6077

## 2023-04-01 LAB — BPAM RBC
Blood Product Expiration Date: 202407072359
Unit Type and Rh: 600

## 2023-04-01 LAB — TYPE AND SCREEN: Unit division: 0

## 2023-04-01 LAB — PREPARE RBC (CROSSMATCH)

## 2023-04-01 LAB — ABO/RH: ABO/RH(D): A NEG

## 2023-04-01 SURGERY — NEPHROURETERECTOMY, ROBOT-ASSISTED, LAPAROSCOPIC
Anesthesia: General | Site: Flank

## 2023-04-01 MED ORDER — OXYBUTYNIN CHLORIDE 5 MG PO TABS
ORAL_TABLET | ORAL | Status: AC
  Filled 2023-04-01: qty 1

## 2023-04-01 MED ORDER — PROPOFOL 1000 MG/100ML IV EMUL
INTRAVENOUS | Status: AC
  Filled 2023-04-01: qty 100

## 2023-04-01 MED ORDER — STERILE WATER FOR IRRIGATION IR SOLN
Status: DC | PRN
  Administered 2023-04-01: 500 mL

## 2023-04-01 MED ORDER — BUPIVACAINE LIPOSOME 1.3 % IJ SUSP
INTRAMUSCULAR | Status: AC
  Filled 2023-04-01: qty 20

## 2023-04-01 MED ORDER — DEXMEDETOMIDINE HCL IN NACL 80 MCG/20ML IV SOLN
INTRAVENOUS | Status: AC
  Filled 2023-04-01: qty 20

## 2023-04-01 MED ORDER — DIPHENHYDRAMINE HCL 12.5 MG/5ML PO ELIX: 12.5000 mg | ORAL_SOLUTION | Freq: Four times a day (QID) | ORAL | Status: DC | PRN

## 2023-04-01 MED ORDER — PHENYLEPHRINE HCL (PRESSORS) 10 MG/ML IV SOLN
INTRAVENOUS | Status: DC | PRN
  Administered 2023-04-01 (×2): 80 ug via INTRAVENOUS
  Administered 2023-04-01: 160 ug via INTRAVENOUS
  Administered 2023-04-01 (×5): 80 ug via INTRAVENOUS
  Administered 2023-04-01 (×2): 160 ug via INTRAVENOUS

## 2023-04-01 MED ORDER — PHENYLEPHRINE 80 MCG/ML (10ML) SYRINGE FOR IV PUSH (FOR BLOOD PRESSURE SUPPORT)
PREFILLED_SYRINGE | INTRAVENOUS | Status: AC
  Filled 2023-04-01: qty 10

## 2023-04-01 MED ORDER — FENTANYL CITRATE (PF) 100 MCG/2ML IJ SOLN: 25.0000 ug | INTRAMUSCULAR | Status: DC | PRN

## 2023-04-01 MED ORDER — DOCUSATE SODIUM 100 MG PO CAPS
100.0000 mg | ORAL_CAPSULE | Freq: Two times a day (BID) | ORAL | Status: DC
  Administered 2023-04-01 – 2023-04-02 (×3): 100 mg via ORAL

## 2023-04-01 MED ORDER — HEPARIN SODIUM (PORCINE) 5000 UNIT/ML IJ SOLN
5000.0000 [IU] | Freq: Three times a day (TID) | INTRAMUSCULAR | Status: DC
  Administered 2023-04-01 – 2023-04-02 (×2): 5000 [IU] via SUBCUTANEOUS

## 2023-04-01 MED ORDER — ROCURONIUM BROMIDE 10 MG/ML (PF) SYRINGE
PREFILLED_SYRINGE | INTRAVENOUS | Status: AC
  Filled 2023-04-01: qty 10

## 2023-04-01 MED ORDER — ORAL CARE MOUTH RINSE: 15.0000 mL | OROMUCOSAL | Status: DC | PRN

## 2023-04-01 MED ORDER — CHLORHEXIDINE GLUCONATE 0.12 % MT SOLN
OROMUCOSAL | Status: AC
  Filled 2023-04-01: qty 15

## 2023-04-01 MED ORDER — ACETAMINOPHEN 10 MG/ML IV SOLN
INTRAVENOUS | Status: DC | PRN
  Administered 2023-04-01: 1000 mg via INTRAVENOUS

## 2023-04-01 MED ORDER — DIPHENHYDRAMINE HCL 50 MG/ML IJ SOLN: 12.5000 mg | Freq: Four times a day (QID) | INTRAMUSCULAR | Status: DC | PRN

## 2023-04-01 MED ORDER — PROPOFOL 10 MG/ML IV BOLUS
INTRAVENOUS | Status: DC | PRN
  Administered 2023-04-01 (×2): 50 mg via INTRAVENOUS
  Administered 2023-04-01: 65 mg via INTRAVENOUS

## 2023-04-01 MED ORDER — DEXMEDETOMIDINE HCL IN NACL 80 MCG/20ML IV SOLN
INTRAVENOUS | Status: DC | PRN
  Administered 2023-04-01: 8 ug via INTRAVENOUS

## 2023-04-01 MED ORDER — ACETAMINOPHEN 325 MG PO TABS
650.0000 mg | ORAL_TABLET | ORAL | Status: DC | PRN
  Administered 2023-04-01 – 2023-04-02 (×2): 650 mg via ORAL

## 2023-04-01 MED ORDER — LIDOCAINE HCL (PF) 2 % IJ SOLN
INTRAMUSCULAR | Status: AC
  Filled 2023-04-01: qty 5

## 2023-04-01 MED ORDER — CEFAZOLIN SODIUM-DEXTROSE 2-4 GM/100ML-% IV SOLN
INTRAVENOUS | Status: AC
  Filled 2023-04-01: qty 100

## 2023-04-01 MED ORDER — OXYBUTYNIN CHLORIDE 5 MG PO TABS
5.0000 mg | ORAL_TABLET | Freq: Three times a day (TID) | ORAL | Status: DC | PRN
  Administered 2023-04-01 – 2023-04-02 (×2): 5 mg via ORAL

## 2023-04-01 MED ORDER — ROSUVASTATIN CALCIUM 5 MG PO TABS
5.0000 mg | ORAL_TABLET | Freq: Every day | ORAL | Status: DC
  Administered 2023-04-01 – 2023-04-02 (×2): 5 mg via ORAL
  Filled 2023-04-01 (×2): qty 1

## 2023-04-01 MED ORDER — SUGAMMADEX SODIUM 200 MG/2ML IV SOLN
INTRAVENOUS | Status: DC | PRN
  Administered 2023-04-01: 150 mg via INTRAVENOUS

## 2023-04-01 MED ORDER — ACETAMINOPHEN 325 MG PO TABS
ORAL_TABLET | ORAL | Status: AC
  Filled 2023-04-01: qty 2

## 2023-04-01 MED ORDER — DOCUSATE SODIUM 100 MG PO CAPS
ORAL_CAPSULE | ORAL | Status: AC
  Filled 2023-04-01: qty 1

## 2023-04-01 MED ORDER — FENTANYL CITRATE (PF) 100 MCG/2ML IJ SOLN
INTRAMUSCULAR | Status: AC
  Filled 2023-04-01: qty 2

## 2023-04-01 MED ORDER — SODIUM CHLORIDE 0.9 % IV SOLN: INTRAVENOUS | Status: DC

## 2023-04-01 MED ORDER — PANTOPRAZOLE SODIUM 40 MG PO TBEC
80.0000 mg | DELAYED_RELEASE_TABLET | Freq: Every day | ORAL | Status: DC
  Administered 2023-04-01 – 2023-04-02 (×2): 80 mg via ORAL

## 2023-04-01 MED ORDER — ONDANSETRON HCL 4 MG/2ML IJ SOLN: 4.0000 mg | INTRAMUSCULAR | Status: DC | PRN

## 2023-04-01 MED ORDER — DONEPEZIL HCL 5 MG PO TABS
10.0000 mg | ORAL_TABLET | Freq: Every day | ORAL | Status: DC
  Administered 2023-04-01: 10 mg via ORAL
  Filled 2023-04-01: qty 2

## 2023-04-01 MED ORDER — ONDANSETRON HCL 4 MG/2ML IJ SOLN
INTRAMUSCULAR | Status: DC | PRN
  Administered 2023-04-01: 4 mg via INTRAVENOUS

## 2023-04-01 MED ORDER — BUPIVACAINE LIPOSOME 1.3 % IJ SUSP
INTRAMUSCULAR | Status: DC | PRN
  Administered 2023-04-01: 50 mL via INTRAMUSCULAR

## 2023-04-01 MED ORDER — CEFAZOLIN SODIUM-DEXTROSE 1-4 GM/50ML-% IV SOLN
INTRAVENOUS | Status: AC
  Filled 2023-04-01: qty 50

## 2023-04-01 MED ORDER — GLYCOPYRROLATE 0.2 MG/ML IJ SOLN
INTRAMUSCULAR | Status: AC
  Filled 2023-04-01: qty 1

## 2023-04-01 MED ORDER — FENTANYL CITRATE (PF) 100 MCG/2ML IJ SOLN
INTRAMUSCULAR | Status: DC | PRN
  Administered 2023-04-01 (×4): 50 ug via INTRAVENOUS

## 2023-04-01 MED ORDER — PANTOPRAZOLE SODIUM 40 MG PO TBEC
DELAYED_RELEASE_TABLET | ORAL | Status: AC
  Filled 2023-04-01: qty 1

## 2023-04-01 MED ORDER — OXYCODONE HCL 5 MG/5ML PO SOLN: 5.0000 mg | Freq: Once | ORAL | Status: DC | PRN

## 2023-04-01 MED ORDER — MEPERIDINE HCL 25 MG/ML IJ SOLN
12.5000 mg | INTRAMUSCULAR | Status: AC
  Administered 2023-04-01: 12.5 mg via INTRAVENOUS

## 2023-04-01 MED ORDER — CEFAZOLIN SODIUM-DEXTROSE 1-4 GM/50ML-% IV SOLN
1.0000 g | Freq: Three times a day (TID) | INTRAVENOUS | Status: AC
  Administered 2023-04-01 (×2): 1 g via INTRAVENOUS

## 2023-04-01 MED ORDER — ONDANSETRON HCL 4 MG/2ML IJ SOLN
INTRAMUSCULAR | Status: AC
  Filled 2023-04-01: qty 2

## 2023-04-01 MED ORDER — MORPHINE SULFATE (PF) 2 MG/ML IV SOLN: 2.0000 mg | INTRAVENOUS | Status: DC | PRN

## 2023-04-01 MED ORDER — RALOXIFENE HCL 60 MG PO TABS
60.0000 mg | ORAL_TABLET | Freq: Every day | ORAL | Status: DC
  Administered 2023-04-01 – 2023-04-02 (×2): 60 mg via ORAL
  Filled 2023-04-01 (×2): qty 1

## 2023-04-01 MED ORDER — MEPERIDINE HCL 25 MG/ML IJ SOLN
INTRAMUSCULAR | Status: AC
  Filled 2023-04-01: qty 1

## 2023-04-01 MED ORDER — SURGIFLO WITH THROMBIN (HEMOSTATIC MATRIX KIT) OPTIME
TOPICAL | Status: DC | PRN
  Administered 2023-04-01: 1 via TOPICAL

## 2023-04-01 MED ORDER — ONDANSETRON HCL 4 MG/2ML IJ SOLN: 4.0000 mg | Freq: Once | INTRAMUSCULAR | Status: DC | PRN

## 2023-04-01 MED ORDER — OXYCODONE-ACETAMINOPHEN 5-325 MG PO TABS: 1.0000 | ORAL_TABLET | ORAL | Status: DC | PRN

## 2023-04-01 MED ORDER — LIDOCAINE HCL (CARDIAC) PF 100 MG/5ML IV SOSY
PREFILLED_SYRINGE | INTRAVENOUS | Status: DC | PRN
  Administered 2023-04-01: 40 mg via INTRAVENOUS

## 2023-04-01 MED ORDER — ACETAMINOPHEN 10 MG/ML IV SOLN
INTRAVENOUS | Status: AC
  Filled 2023-04-01: qty 100

## 2023-04-01 MED ORDER — ROCURONIUM BROMIDE 100 MG/10ML IV SOLN
INTRAVENOUS | Status: DC | PRN
  Administered 2023-04-01 (×3): 10 mg via INTRAVENOUS
  Administered 2023-04-01: 40 mg via INTRAVENOUS
  Administered 2023-04-01 (×4): 10 mg via INTRAVENOUS
  Administered 2023-04-01: 20 mg via INTRAVENOUS

## 2023-04-01 MED ORDER — GLYCOPYRROLATE 0.2 MG/ML IJ SOLN
INTRAMUSCULAR | Status: DC | PRN
  Administered 2023-04-01: .2 mg via INTRAVENOUS

## 2023-04-01 MED ORDER — HEMOSTATIC AGENTS (NO CHARGE) OPTIME
TOPICAL | Status: DC | PRN
  Administered 2023-04-01: 1 via TOPICAL

## 2023-04-01 MED ORDER — MEMANTINE HCL 5 MG PO TABS
5.0000 mg | ORAL_TABLET | Freq: Every day | ORAL | Status: DC
  Administered 2023-04-01 – 2023-04-02 (×2): 5 mg via ORAL
  Filled 2023-04-01 (×2): qty 1

## 2023-04-01 MED ORDER — OXYCODONE HCL 5 MG PO TABS: 5.0000 mg | ORAL_TABLET | Freq: Once | ORAL | Status: DC | PRN

## 2023-04-01 MED ORDER — GEMCITABINE CHEMO FOR BLADDER INSTILLATION 2000 MG
INTRAVENOUS | Status: DC | PRN
  Administered 2023-04-01: 2000 mg via INTRAVESICAL

## 2023-04-01 MED ORDER — DEXAMETHASONE SODIUM PHOSPHATE 10 MG/ML IJ SOLN
INTRAMUSCULAR | Status: AC
  Filled 2023-04-01: qty 1

## 2023-04-01 MED ORDER — PROPOFOL 500 MG/50ML IV EMUL
INTRAVENOUS | Status: DC | PRN
  Administered 2023-04-01: 100 ug/kg/min via INTRAVENOUS

## 2023-04-01 MED ORDER — BUPIVACAINE HCL (PF) 0.5 % IJ SOLN
INTRAMUSCULAR | Status: AC
  Filled 2023-04-01: qty 30

## 2023-04-01 MED ORDER — PROPOFOL 10 MG/ML IV BOLUS
INTRAVENOUS | Status: AC
  Filled 2023-04-01: qty 20

## 2023-04-01 MED ORDER — HEPARIN SODIUM (PORCINE) 5000 UNIT/ML IJ SOLN
INTRAMUSCULAR | Status: AC
  Filled 2023-04-01: qty 1

## 2023-04-01 MED ORDER — DEXAMETHASONE SODIUM PHOSPHATE 10 MG/ML IJ SOLN
INTRAMUSCULAR | Status: DC | PRN
  Administered 2023-04-01: 10 mg via INTRAVENOUS

## 2023-04-01 MED ORDER — LACTATED RINGERS IV SOLN: INTRAVENOUS | Status: DC | PRN

## 2023-04-01 SURGICAL SUPPLY — 82 items
ADH SKN CLS APL DERMABOND .7 (GAUZE/BANDAGES/DRESSINGS) ×4
AGENT HMST KT MTR STRL THRMB (HEMOSTASIS) ×2
ANCHOR TIS RET SYS 1550ML (BAG) ×2 IMPLANT
APL ESCP 34 STRL LF DISP (HEMOSTASIS)
APPLICATOR SURGIFLO ENDO (HEMOSTASIS) ×2 IMPLANT
BAG DRN LRG CPC RND TRDRP CNTR (MISCELLANEOUS) ×2
BAG SPEC RTRVL C1550 25.4 (BAG) ×2
BAG URO DRAIN 4000ML (MISCELLANEOUS) ×2 IMPLANT
BULB RESERV EVAC DRAIN JP 100C (MISCELLANEOUS) IMPLANT
CANNULA REDUCER 12-8 DVNC XI (CANNULA) ×2 IMPLANT
CATH FOL 3WAY LX 18X30 (CATHETERS) ×2 IMPLANT
CLIP LIGATING HEM O LOK PURPLE (MISCELLANEOUS) ×8 IMPLANT
COVER TIP SHEARS 8 DVNC (MISCELLANEOUS) ×2 IMPLANT
DERMABOND ADVANCED .7 DNX12 (GAUZE/BANDAGES/DRESSINGS) ×4 IMPLANT
DRAIN CHANNEL JP 15F RND 16 (MISCELLANEOUS) IMPLANT
DRAIN CHANNEL JP 19F (MISCELLANEOUS) IMPLANT
DRAPE 3/4 80X56 (DRAPES) ×2 IMPLANT
DRAPE ARM DVNC X/XI (DISPOSABLE) ×8 IMPLANT
DRAPE COLUMN DVNC XI (DISPOSABLE) ×2 IMPLANT
DRIVER NDL LRG 8 DVNC XI (INSTRUMENTS) ×2 IMPLANT
DRIVER NDLE LRG 8 DVNC XI (INSTRUMENTS) ×4 IMPLANT
ELECT REM PT RETURN 9FT ADLT (ELECTROSURGICAL) ×2
ELECTRODE REM PT RTRN 9FT ADLT (ELECTROSURGICAL) ×2 IMPLANT
FORCEPS BPLR 8 MD DVNC XI (FORCEP) ×2 IMPLANT
FORCEPS BPLR FENES DVNC XI (FORCEP) ×2 IMPLANT
GLOVE BIO SURGEON STRL SZ 6.5 (GLOVE) ×4 IMPLANT
GLOVE BIOGEL PI IND STRL 7.5 (GLOVE) ×2 IMPLANT
GOWN STRL REUS W/ TWL LRG LVL3 (GOWN DISPOSABLE) ×4 IMPLANT
GOWN STRL REUS W/ TWL XL LVL3 (GOWN DISPOSABLE) ×2 IMPLANT
GOWN STRL REUS W/TWL LRG LVL3 (GOWN DISPOSABLE) ×4
GOWN STRL REUS W/TWL XL LVL3 (GOWN DISPOSABLE) ×2
GRASPER TIP-UP FEN DVNC XI (INSTRUMENTS) ×2 IMPLANT
HEMOSTAT SURGICEL 2X14 (HEMOSTASIS) ×2 IMPLANT
HOLDER FOLEY CATH W/STRAP (MISCELLANEOUS) ×2 IMPLANT
IRRIGATION STRYKERFLOW (MISCELLANEOUS) ×2 IMPLANT
IRRIGATOR STRYKERFLOW (MISCELLANEOUS) ×2
KIT PINK PAD W/HEAD ARE REST (MISCELLANEOUS) ×2
KIT PINK PAD W/HEAD ARM REST (MISCELLANEOUS) ×2 IMPLANT
KIT TURNOVER KIT A (KITS) ×2 IMPLANT
KITTNER LAPARASCOPIC 5X40 (MISCELLANEOUS) IMPLANT
LABEL OR SOLS (LABEL) ×2 IMPLANT
LOOP VESSEL MAXI 1X406 RED (MISCELLANEOUS) ×2 IMPLANT
MANIFOLD NEPTUNE II (INSTRUMENTS) ×2 IMPLANT
NDL HYPO 22X1.5 SAFETY MO (MISCELLANEOUS) ×2 IMPLANT
NDL INSUFFLATION 14GA 120MM (NEEDLE) ×2 IMPLANT
NEEDLE HYPO 22X1.5 SAFETY MO (MISCELLANEOUS) ×2 IMPLANT
NEEDLE INSUFFLATION 14GA 120MM (NEEDLE) ×2 IMPLANT
NS IRRIG 500ML POUR BTL (IV SOLUTION) ×2 IMPLANT
OBTURATOR OPTICAL STND 8 DVNC (TROCAR) ×2
OBTURATOR OPTICALSTD 8 DVNC (TROCAR) ×2 IMPLANT
PACK LAP CHOLECYSTECTOMY (MISCELLANEOUS) ×2 IMPLANT
PENCIL SMOKE EVACUATOR (MISCELLANEOUS) ×2 IMPLANT
PLUG CATH AND CAP STER (CATHETERS) ×2 IMPLANT
RELOAD STAPLE 60 2.6 WHT THN (STAPLE) IMPLANT
RELOAD STAPLER WHITE 60MM (STAPLE) ×6 IMPLANT
SCISSORS MNPLR CVD DVNC XI (INSTRUMENTS) ×2 IMPLANT
SEAL UNIV 5-12 XI (MISCELLANEOUS) ×10 IMPLANT
SET CYSTO W/LG BORE CLAMP LF (SET/KITS/TRAYS/PACK) IMPLANT
SET TUBE SMOKE EVAC HIGH FLOW (TUBING) ×2 IMPLANT
SOL ELECTROSURG ANTI STICK (MISCELLANEOUS) ×2
SOLUTION ELECTROSURG ANTI STCK (MISCELLANEOUS) ×2 IMPLANT
SPONGE DRAIN TRACH 4X4 STRL 2S (GAUZE/BANDAGES/DRESSINGS) ×2 IMPLANT
SPONGE T-LAP 18X18 ~~LOC~~+RFID (SPONGE) IMPLANT
SPONGE T-LAP 4X18 ~~LOC~~+RFID (SPONGE) ×2 IMPLANT
SPONGE VERSALON 4X4 4PLY (MISCELLANEOUS) ×2 IMPLANT
STAPLE ECHEON FLEX 60 POW ENDO (STAPLE) IMPLANT
STAPLER RELOAD WHITE 60MM (STAPLE) ×6
STAPLER SKIN PROX 35W (STAPLE) ×2 IMPLANT
STRAP SAFETY 5IN WIDE (MISCELLANEOUS) ×6 IMPLANT
SURGIFLO W/THROMBIN 8M KIT (HEMOSTASIS) ×2 IMPLANT
SUT DVC VLOC 90 3-0 CV23 UNDY (SUTURE) ×2 IMPLANT
SUT ETHILON 3-0 FS-10 30 BLK (SUTURE)
SUT MNCRL AB 4-0 PS2 18 (SUTURE) ×4 IMPLANT
SUT PDS AB 1 TP1 96 (SUTURE) ×2 IMPLANT
SUT VIC AB 0 CT1 36 (SUTURE) ×2 IMPLANT
SUTURE EHLN 3-0 FS-10 30 BLK (SUTURE) IMPLANT
TAPE CLOTH 3X10 WHT NS LF (GAUZE/BANDAGES/DRESSINGS) ×4 IMPLANT
TRAP FLUID SMOKE EVACUATOR (MISCELLANEOUS) ×2 IMPLANT
TROCAR Z-THREAD FIOS 12X100MM (TROCAR) ×2 IMPLANT
TROCAR Z-THREAD FIOS 5X100MM (TROCAR) ×2 IMPLANT
WATER STERILE IRR 3000ML UROMA (IV SOLUTION) ×2 IMPLANT
WATER STERILE IRR 500ML POUR (IV SOLUTION) ×2 IMPLANT

## 2023-04-01 NOTE — H&P (Signed)
04/01/23  RR[facility] CTAB  Updated, no changes  Jessica Morrison 1946-06-16 [id number]   Referring provider: Marguarite Arbour, MD [address] [facility] [place],  [place] [address]       Chief Complaint  Patient presents with   Follow-up      Discuss surgery options      HPI: 77 year-old female with a personal history of upper tract urothelial carcinoma presents today to discuss the logistics of surgery.    She initially presented with hematuria. She had a ct urogram on 09/25/2022 indicating a complex cystic mass seen in the left medial upper pole measuring 3.8 by 2.5 with irregular mural thickening. Diagnostic ureteroscopy with Dr. Lonna Cobb in September indicated high grade upper tract urothelial carcinoma. Stent was subsequently removed. PET scan was negative for metastatic disease. She's now under the care of [facility] Neoadjuvant Gemcitabine Cisplatin. Recently the dose had to be reduced. She just completed her third cycle with plans for a fourth cycle.   She was seen in the emergency room recently for lower extremity edema, which is felt to be CHF related. She also had a rise in her creatinine to 2.02.    She said her fourth cycle of chemo is being done today. She reports that Dr. Orlie Dakin called in Lasix for the edema. It resolved in 2 days. She does not have a history of heart disease or CHF and has been referred to a cardiologist to verify any new cardiology issues.    She is anxious to have surgery.      PMH:     Past Medical History:  Diagnosis Date   Back pain     Basal cell carcinoma 08/04/2020    0.5cm above the left mid brow, [facility] 09/15/20   Cancer (HCC)      Melanoma   Curvature of spine     Dysplastic nevus 04/30/2013    left post shoulder, left parasternal   Dysplastic nevus 05/04/2014    right prox ant deltoid, left lat buttock   Dysplastic nevus 03/26/2019    right lat neck adjacent to the inf top of MM in situ  site scar   Dysplastic nevus 10/29/2019    upper back left paraspinal at level of mid scapula   Dysplastic nevus 03/31/2020, 05/12/20 shave removal    Left mid pretibial. Moderate to severe atypia, deep margin involved.   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    right lat neck inf to right angle of mandible, excised: 07/29/2018, margins free   Nephrolithiasis     Osteopenia     Osteoporosis     Pelvic pain in female     Raynaud disease     Thyromegaly        Surgical History:      Past Surgical History:  Procedure Laterality Date   ABDOMINAL HYSTERECTOMY       BILATERAL OOPHORECTOMY   1999   CESAREAN SECTION       COLONOSCOPY WITH PROPOFOL N/A 05/09/2015  Procedure: COLONOSCOPY WITH PROPOFOL;  Surgeon: Elnita Maxwell, MD;  Location: [facility] ENDOSCOPY;  Service: Endoscopy;  Laterality: N/A;   CYSTOSCOPY WITH RETROGRADE PYELOGRAM, URETEROSCOPY AND STENT PLACEMENT Left 10/09/2022    Procedure: CYSTOSCOPY WITH RETROGRADE PYELOGRAM, URETEROSCOPY AND STENT PLACEMENT;  Surgeon: Riki Altes, MD;  Location: [facility] ORS;  Service: Urology;  Laterality: Left;   IR IMAGING GUIDED PORT INSERTION   12/06/2022   MELANOMA EXCISION Right 07/29/2018    lateral neck inf. to right angle of mandible   OOPHORECTOMY       URETERAL BIOPSY Left 10/09/2022    Procedure: URETERAL BIOPSY;  Surgeon: Riki Altes, MD;  Location: [facility] ORS;  Service: Urology;  Laterality: Left;      Home Medications:  Allergies as of 02/19/2023         Reactions    Codeine Nausea And Vomiting    Neosporin Wound Cleanser [benzalkonium Chloride] Other (See Comments)    Skin blisters    Penicillins               Medication List           Accurate as of Feb 19, 2023  9:08 AM. If you have any questions, ask your nurse or doctor.              acetaminophen 325 MG tablet Commonly known as: TYLENOL Take 650 mg by mouth every 6 (six) hours as needed.    calcium-vitamin D 500-200 MG-UNIT tablet Commonly known as: OSCAL WITH D 1 tablet daily with breakfast.    Cholecalciferol 25 MCG (1000 UT) tablet Take 1,000 Units by mouth daily.    cyanocobalamin 1000 MCG tablet Take 1,000 mcg by mouth daily.    dexamethasone 4 MG tablet Commonly known as: DECADRON Take 1 tablet (4 mg total) by mouth daily.    donepezil 10 MG tablet Commonly known as: ARICEPT TAKE 1 TABLET BY MOUTH ONCE EVERY EVENING    furosemide 20 MG tablet Commonly known as: LASIX Take one tablet daily for 2 days then as needed once daily    lidocaine-prilocaine cream Commonly known as: EMLA Apply to affected area once    meloxicam 7.5 MG tablet Commonly known as: MOBIC Take 1 tablet by mouth daily.    memantine 5 MG tablet Commonly known as: NAMENDA Take 5 mg by mouth 2 (two) times daily.    omeprazole 40 MG capsule Commonly known as: PRILOSEC 40 mg daily.    ondansetron 8 MG tablet Commonly known as: Zofran Take 1 tablet (8 mg total) by mouth every 8 (eight) hours as needed for nausea or vomiting. Start on the third day after cisplatin.    prochlorperazine 10 MG tablet Commonly known as: COMPAZINE Take 1 tablet (10 mg total) by mouth every 6 (six) hours as needed (Nausea or vomiting).    raloxifene 60 MG tablet Commonly known as: EVISTA TAKE 1 TABLET BY MOUTH ONCE A DAY    rosuvastatin 5 MG tablet Commonly known as: CRESTOR Take 5 mg by mouth daily.             Allergies:       Allergies  Allergen Reactions   Codeine Nausea And Vomiting   Neosporin Wound Cleanser [Benzalkonium Chloride] Other (See Comments)      Skin blisters   Penicillins        Family History:      Family  History  Problem Relation Age of Onset   Emphysema Father  Cancer Neg Hx     Heart disease Neg Hx     Diabetes Neg Hx     Breast cancer Neg Hx        Social History:  reports that she has never smoked. She has never been exposed to tobacco smoke. She has never used smokeless tobacco. She reports that she does not drink alcohol and does not use drugs.     Physical Exam: BP (!) 147/78   Pulse 60   Ht 4' 9.5" (1.461 m)   Wt 99 lb 4 oz (45 kg)   BMI 21.11 kg/m   Constitutional:  Alert and oriented, No acute distress.  Companied today by her daughter. HEENT: [place] AT, moist mucus membranes.  Trachea midline, no masses. Neurologic: Grossly intact, no focal deficits, moving all 4 extremities. Psychiatric: Normal mood and affect.     Assessment & Plan:     Upper tract urothelial carcinoma  - She's getting last cycle of of neoadjuvant chemotherapy today. Prefer about 4-6 weeks to recover after this dose before surgery  - Agree with cardiology referral to ensure that she has no underlying cardiac pathology prior to surgery, denies any risk  - Discussed risks and benefits of surgery including worsening renal function, bleeding, infection, and damage to surrounding structures, etc.  - She understands the post-operative course, including lifting restrictions and need for Foley catheter post-op. All questions were answered. Will fill out a booking sheet today and hope to have surgery done in about 6 weeks. -Discussed today, no additional need for upper tract imaging given that she had no evidence of metastatic disease prior to neoadjuvant therapy   2. 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    Vanna Scotland, MD     [facility] [address] [place], [place] [address] [phone]

## 2023-04-01 NOTE — Anesthesia Postprocedure Evaluation (Signed)
Anesthesia Post Note  Patient: Jessica Morrison  Procedure(s) Performed: XI ROBOT ASSITED LAPAROSCOPIC NEPHROURETERECTOMY (Left: Flank) BLADDER INSTILLATION OF GEMCITABINE (Bladder)  Patient location during evaluation: PACU Anesthesia Type: General Level of consciousness: awake and alert Pain management: pain level controlled Vital Signs Assessment: post-procedure vital signs reviewed and stable Respiratory status: spontaneous breathing, nonlabored ventilation, respiratory function stable and patient connected to nasal cannula oxygen Cardiovascular status: blood pressure returned to baseline and stable Postop Assessment: no apparent nausea or vomiting Anesthetic complications: no   No notable events documented.   Last Vitals:  Vitals:   04/01/23 1300 04/01/23 1315  BP: 120/67   Pulse: (!) 109 (!) (P) 108  Resp: 13   Temp:    SpO2: 100%     Last Pain:  Vitals:   04/01/23 1300  TempSrc:   PainSc: Asleep                 Corinda Gubler

## 2023-04-01 NOTE — Transfer of Care (Signed)
Immediate Anesthesia Transfer of Care Note  Patient: Jessica Morrison  Procedure(s) Performed: XI ROBOT ASSITED LAPAROSCOPIC NEPHROURETERECTOMY (Left: Flank) BLADDER INSTILLATION OF GEMCITABINE (Bladder)  Patient Location: PACU  Anesthesia Type:General  Level of Consciousness: drowsy and patient cooperative  Airway & Oxygen Therapy: Patient Spontanous Breathing and Patient connected to nasal cannula oxygen  Post-op Assessment: Report given to RN and Patient moving all extremities X 4  Post vital signs: Reviewed and stable  Last Vitals:  Vitals Value Taken Time  BP 133/76 04/01/23 1200  Temp    Pulse 106 04/01/23 1205  Resp 13 04/01/23 1205  SpO2 100 % 04/01/23 1205  Vitals shown include unvalidated device data.  Last Pain:  Vitals:   04/01/23 0623  TempSrc: Temporal  PainSc: 0-No pain      Patients Stated Pain Goal: 0 (04/01/23 1610)  Complications: No notable events documented.

## 2023-04-01 NOTE — Anesthesia Preprocedure Evaluation (Signed)
Anesthesia Evaluation  Patient identified by MRN, date of birth, ID band Patient awake  General Assessment Comment:  Patient AO x 3, however a little slow to respond, with occasional confusion. She does understand what she is having done today and the laterality. I believe she has capacity to consent, and daughter at bedside agreed.  Reviewed: Allergy & Precautions, NPO status , Patient's Chart, lab work & pertinent test results  History of Anesthesia Complications (+) PONV and history of anesthetic complications  Airway Mallampati: II  TM Distance: >3 FB Neck ROM: Full    Dental  (+) Chipped   Pulmonary neg pulmonary ROS, neg sleep apnea, neg COPD, Patient abstained from smoking.Not current smoker   Pulmonary exam normal breath sounds clear to auscultation       Cardiovascular Exercise Tolerance: Good METShypertension, (-) CAD and (-) Past MI (-) dysrhythmias  Rhythm:Regular Rate:Normal - Systolic murmurs    Neuro/Psych  PSYCHIATRIC DISORDERS     Dementia negative neurological ROS     GI/Hepatic ,GERD  Medicated and Controlled,,(+)     (-) substance abuse    Endo/Other  neg diabetes    Renal/GU Renal disease     Musculoskeletal  (+) Arthritis ,    Abdominal   Peds  Hematology   Anesthesia Other Findings Past Medical History: No date: B12 deficiency No date: Back pain 08/04/2020: Basal cell carcinoma     Comment:  0.5cm above the left mid brow, [facility] 09/15/20 No date: Cancer [facility])     Comment:  Melanoma No date: Curvature of spine 04/30/2013: Dysplastic nevus     Comment:  left post shoulder, left parasternal 05/04/2014: Dysplastic nevus     Comment:  right prox ant deltoid, left lat buttock 03/26/2019: Dysplastic nevus     Comment:  right lat neck adjacent to the inf top of MM in situ               site scar 10/29/2019: Dysplastic nevus     Comment:  upper back left paraspinal at level of mid  scapula 03/31/2020, 05/12/20 shave removal: Dysplastic nevus     Comment:  Left mid pretibial. Moderate to severe atypia, deep               margin involved. 06/29/2021: Dysplastic nevus     Comment:  L ant deltoid, moderat to severe atypia, excised               08/15/21 No date: Endometriosis No date: Fibrocystic disease of breast No date: Fibroid No date: GERD (gastroesophageal reflux disease) No date: History of kidney stones No date: HLD (hyperlipidemia) No date: Hyperlipemia No date: Hypertension No date: Kidney stone 07/03/2018: Melanoma in situ [facility])     Comment:  right lat neck inf to right angle of mandible, excised:               07/29/2018, margins free No date: Memory loss No date: Nephrolithiasis No date: Osteoarthritis No date: Osteopenia No date: Osteoporosis No date: Pelvic pain in female No date: PONV (postoperative nausea and vomiting) No date: Pre-diabetes No date: Raynaud disease 03/2023: Renal cancer, left (HCC) No date: Thyromegaly  Reproductive/Obstetrics                             Anesthesia Physical Anesthesia Plan  ASA: 2  Anesthesia Plan: General   Post-op Pain Management: Ofirmev IV (intra-op)*   Induction: Intravenous  PONV Risk Score and  Plan: 4 or greater and Ondansetron, Dexamethasone, TIVA and Propofol infusion  Airway Management Planned: Oral ETT and Nasal Cannula  Additional Equipment: None  Intra-op Plan:   Post-operative Plan: Extubation in OR  Informed Consent: I have reviewed the patients History and Physical, chart, labs and discussed the procedure including the risks, benefits and alternatives for the proposed anesthesia with the patient or authorized representative who has indicated his/her understanding and acceptance.     Dental advisory given  Plan Discussed with: CRNA and Surgeon  Anesthesia Plan Comments: (Discussed risks of anesthesia with patient and daughter at bedside, including  PONV, sore throat, lip/dental/eye damage, post operative cognitive dysfunction. Rare risks discussed as well, such as cardiorespiratory and neurological sequelae, and allergic reactions. Discussed the role of CRNA in patient's perioperative care. Patient understands.)       Anesthesia Quick Evaluation

## 2023-04-01 NOTE — Op Note (Signed)
04/01/23   PREOPERATIVE DIAGNOSES: Left upper tract urothelial carcinoma   POSTOPERATIVE DIAGNOSES: Same as above   PROCEDURE PERFORMED: Left robotic nephroureterectomy, instillation of intravesical gemcitabine    ATTENDING SURGEON: Claris Gladden, MD     Assistant: Dr. Legrand Rams   ANESTHESIA: General anesthesia.   ESTIMATED BLOOD LOSS: 50 mL.     DRAINS: JP drain, 18 French 3-way Foley catheter.     COMPLICATIONS: None.     SPECIMENS: Left kidney with ureter and bladder cuff.     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 Risk and benefits of the procedure were explained in detail to the patient who agreed to proceed as planned.     PROCEDURE: The patient was correctly identified in the preoperative holding area and informed consent was confirmed. She was brought to the operating suite and placed on the table in the supine position. At this time, universal timeout protocol performed. All team members were identified. Venodyne boots were placed and she was administered 2 grams of IV Ancef in the perioperative period. An 69 French 3-way Foley catheter was then placed using standard sterile technique and hooked up to a CBI for planned use later in the procedure. She was then repositioned in the lateral decubitus position with the left flank up and all pressure points were carefully padded.  Given that she was not positioned on her foot axilla, no axillary roll was applied and she was secured to the table using straps, gel pads, and tape. She was then prepped and draped in standard surgical fashion. At this point in time, a Veress needle was used in the left lateral mid abdomen to insufflate the abdominal cavity. There was negative saline drop test without any bloody aspirate and he did have relatively low opening pressures of 8 mmHg. The abodmen insufflated nicely and it  was well tolerated. At this point, all port sites were carefully planned and mapped out across her left hemi-abdomen; Lipsoma marcaine was used in all of the laparoscopic port sites. The port sites were as follows: A 12 mm assistant port was placed in the midline just above the level of the umbilicus, an additional 12 mm midabdominal port site was placed and used for a camera port using a 12 mm trocar, an 8 mm robotic port was placed in the left upper quadrant in the mammary line just below the costal margin, 2 additional 8 mm robotic port sites were placed in the left lateral lower abdomen and left lower mid abdomen as used for additional robotic port sites. The initial trocar was placed using a 5 mm Visiport, which was later converted to an 8 mm robotic arm. There was absolutely no injury noted from the Veress needle or trocar sites. The robot was then brought in and docked using bipolar [place] in the left, scissors on the right with a tip up grasper in the 4th arm.    The white line of Toldt was incised and the left hemicolon was reflected medially. There was a nice plane here and Gerota fascia could be easily identified. The ureter and gonadal vein were then identified in the lower portion.  Immediately placed back across the ureter for cancer control.  The tail of Gerota was then traced superiorly towards the hilum. The hilum was then further dissected very carefully and precisely such that 1 single renal vein was identified as well as  1 single artery just posterior and slightly superior to the vein. The dissection was then carried up inferiorly and a plane was created between the adrenal gland and the middle aspect of the upper pole of the kidney. The splenorenal ligament was then incised and the spleen was mobilized superiorly as well. A plane was then created posterior to the kidney, and the kidney was retracted superiorly to place the hilum on stretch. At this point in time, a 60 mm vascular stapler was  used to first ligate the renal artery and vein together en bloc.. There was no bleeding or complication associated with the stapling of the hilum. The kidney was then further mobilized and the upper pole was eventually able to be freed. The tail of Gerota's was then freed up until the kidney was completely free from its attachments other than by ureter.The ureter was then traced inferiorly as it crossed over the iliac vessels down into the deep pelvis.    At this point in time, I reposition the interpretation of camera ports as well as redocked the robot with orientation towards the pelvis.   Some small periureteral vessels were controlled and the dissection was carried all the way down to the bladder cuff. The detrusor was opened around the ureter that entered the trigone such that a bulge mucosa could be seen at the UVJ. At this point in time, a preplaced 4-0 Vicryl suture barbed was used on the right lateral aspect of the UVJ just lateral to where the planned transection of the ureter would occur as an anchor stitch. An additional Weck clip was applied at the very distal aspect of the ureter to avoid any tumor spillage. The ureter was then transected, taking a cuff of normal bladder mucosa with the margin. The bladder mucosa and detrusor were closed in a single running layer using that preplaced Vicryl suture; this appeared to be watertight at the was tied down. The bladder was emptied just prior to transection of the ureter. It was refilled with 300 mL of saline and no leak noted whatsoever.    I then returned back to the kidney and detached the superior lateral and additional attachments such that the entire kidney and ureter were freed. Two additional staple fires were used to free up the left upper medial aspect of the kidney. At this point in time, the kidney and ureter were placed in a large endoscopic bag, which was brought in using the 12 mm port incision near the umbilicus connecting that with the  12 mm port just superior to this. A 19 French round Blake drain was placed in the left lower lateral port site and placed within the dependent pelvis as a drain.  Unfortunately at this point in time, pneumoperitoneum was lost and and unable to be reestablished.  We were able to remove all foreign bodies other than the kidney and the bag prior to undocking.  A midline incision was created by extending the to 12 port millimeter sites and opening the abdomen.  The kidney was then delivered.  I then placed a lap in the left retroperitoneum and no additional bleeding was noted.  Surgiflo and Surgicel were placed near the hilum..  The midline incision was then closed using a #1 looped PDS.  Subcutaneous tissues were closed using Vicryl to close the dead space.  All remaining trocars were removed under direct visualization. There was no bleeding noted.     There were no complications in this case. All needle counts  and instrument counts as well as laps were correct at the end of the case.      2000 mg of intravesical gemcitabine was instilled into the foley and allowed to dwell for 1 hour in the PACU.       An assistant was required for this surgical procedure. The duties of the assistant included but were not limited to suctioning, passing suture, camera manipulation, retraction. This procedure would not be able to be performed without an Geophysicist/field seismologist.

## 2023-04-01 NOTE — Anesthesia Procedure Notes (Signed)
Procedure Name: Intubation Date/Time: 04/01/2023 7:55 AM  Performed by: Lily Lovings, CRNAPre-anesthesia Checklist: Patient identified, Patient being monitored, Timeout performed, Emergency Drugs available and Suction available Patient Re-evaluated:Patient Re-evaluated prior to induction Oxygen Delivery Method: Circle system utilized Preoxygenation: Pre-oxygenation with 100% oxygen Induction Type: IV induction Ventilation: Mask ventilation without difficulty Laryngoscope Size: 3 and McGraph Grade View: Grade I Tube type: Oral Tube size: 6.5 mm Number of attempts: 1 Airway Equipment and Method: Stylet Placement Confirmation: ETT inserted through vocal cords under direct vision, positive ETCO2 and breath sounds checked- equal and bilateral Secured at: 20 cm Tube secured with: Tape Dental Injury: Teeth and Oropharynx as per pre-operative assessment  Comments: Soft bite block

## 2023-04-02 ENCOUNTER — Other Ambulatory Visit: Payer: Self-pay | Admitting: Physician Assistant

## 2023-04-02 ENCOUNTER — Encounter: Payer: Self-pay | Admitting: Urology

## 2023-04-02 DIAGNOSIS — C688 Malignant neoplasm of overlapping sites of urinary organs: Secondary | ICD-10-CM

## 2023-04-02 LAB — CBC
HCT: 24.4 % — ABNORMAL LOW (ref 36.0–46.0)
Hemoglobin: 8.1 g/dL — ABNORMAL LOW (ref 12.0–15.0)
MCH: 30.6 pg (ref 26.0–34.0)
MCHC: 33.2 g/dL (ref 30.0–36.0)
MCV: 92.1 fL (ref 80.0–100.0)
Platelets: 103 10*3/uL — ABNORMAL LOW (ref 150–400)
RBC: 2.65 MIL/uL — ABNORMAL LOW (ref 3.87–5.11)
RDW: 16.8 % — ABNORMAL HIGH (ref 11.5–15.5)
WBC: 13 10*3/uL — ABNORMAL HIGH (ref 4.0–10.5)
nRBC: 0 % (ref 0.0–0.2)

## 2023-04-02 LAB — BASIC METABOLIC PANEL
Anion gap: 9 (ref 5–15)
BUN: 23 mg/dL (ref 8–23)
CO2: 21 mmol/L — ABNORMAL LOW (ref 22–32)
Calcium: 8.4 mg/dL — ABNORMAL LOW (ref 8.9–10.3)
Chloride: 105 mmol/L (ref 98–111)
Creatinine, Ser: 1.87 mg/dL — ABNORMAL HIGH (ref 0.44–1.00)
GFR, Estimated: 27 mL/min — ABNORMAL LOW (ref >60)
Glucose, Bld: 116 mg/dL — ABNORMAL HIGH (ref 70–99)
Potassium: 4.2 mmol/L (ref 3.5–5.1)
Sodium: 135 mmol/L (ref 135–145)

## 2023-04-02 MED ORDER — OXYBUTYNIN CHLORIDE 5 MG PO TABS: 5.0000 mg | ORAL_TABLET | Freq: Three times a day (TID) | ORAL | 0 refills | Status: DC | PRN

## 2023-04-02 MED ORDER — OXYBUTYNIN CHLORIDE 5 MG PO TABS
ORAL_TABLET | ORAL | Status: AC
  Filled 2023-04-02: qty 1

## 2023-04-02 MED ORDER — PANTOPRAZOLE SODIUM 40 MG PO TBEC
DELAYED_RELEASE_TABLET | ORAL | Status: AC
  Filled 2023-04-02: qty 1

## 2023-04-02 MED ORDER — OXYCODONE-ACETAMINOPHEN 5-325 MG PO TABS: 1.0000 | ORAL_TABLET | Freq: Four times a day (QID) | ORAL | 0 refills | Status: DC | PRN

## 2023-04-02 MED ORDER — HEPARIN SODIUM (PORCINE) 5000 UNIT/ML IJ SOLN
INTRAMUSCULAR | Status: AC
  Filled 2023-04-02: qty 1

## 2023-04-02 MED ORDER — DOCUSATE SODIUM 100 MG PO CAPS: 100.0000 mg | ORAL_CAPSULE | Freq: Two times a day (BID) | ORAL | 0 refills | Status: DC

## 2023-04-02 MED ORDER — ACETAMINOPHEN 325 MG PO TABS
ORAL_TABLET | ORAL | Status: AC
  Filled 2023-04-02: qty 2

## 2023-04-02 MED ORDER — DOCUSATE SODIUM 100 MG PO CAPS
ORAL_CAPSULE | ORAL | Status: AC
  Filled 2023-04-02: qty 1

## 2023-04-02 NOTE — Plan of Care (Signed)
  Problem: Pain Managment: Goal: General experience of comfort will improve Outcome: Progressing   Problem: Safety: Goal: Ability to remain free from injury will improve Outcome: Progressing   Problem: Skin Integrity: Goal: Risk for impaired skin integrity will decrease Outcome: Progressing   

## 2023-04-02 NOTE — Progress Notes (Signed)
DISCHARGE NOTE:  Pt and daughter given discharge instructions and verbalized understanding. Pt discharged with foley. Foley education done, (peri care & emptying the urine bag). Pt wheeled to car by staff, daughter providing transportation.

## 2023-04-02 NOTE — Discharge Summary (Signed)
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   Urothelial carcinoma of kidney (HCC) 11/30/2022   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   Memory disorder 06/20/2021   B12 deficiency 10/20/2019   Skin macule 08/23/2016   Family history of malignant melanoma 08/23/2016   Endometriosis 08/16/2015   Vaginal atrophy 08/16/2015   Bloodgood disease 04/22/2014   Calculus of kidney 04/22/2014   BP (high blood pressure) 03/16/2014   HLD (hyperlipidemia) 03/16/2014   Arthritis, degenerative 03/16/2014   Osteoporosis 03/16/2014   Raynaud's syndrome without gangrene 03/16/2014   Big thyroid 03/16/2014   Raynaud phenomenon 03/16/2014   History and Physical: For full details, please see admission history and physical. Briefly, Jessica Morrison is a 77 y.o. year old patient admitted on 04/01/2023 for scheduled left robotic nephro ureterectomy with instillation of intravesical gemcitabine with Dr. Apolinar Junes for management of left upper tract urothelial carcinoma.   This morning she reports feeling well.  She has been able to ambulate and her pain is well-controlled.  She is eating breakfast on my arrival and tolerating it without nausea/vomiting.  Her daughter is at the bedside.  Foley catheter in place draining clear urine.  LLQ JP drain in place draining scant serosanguineous fluid.  Anticipated postoperative changes noted on a.m. labs, WBC count 13.0, hemoglobin 8.1, creatinine 1.87.  Physical Exam: Constitutional:  Alert and oriented, no acute distress, nontoxic appearing HEENT: [place], AT Cardiovascular: No clubbing, cyanosis, or edema Respiratory: Normal respiratory effort, no increased work of breathing GI:  Abdomen is soft with appropriate postoperative tenderness, no rigidity or rebound.  Incisions are clean, dry, and intact with overlying surgical adhesive. Skin: No rashes, bruises or suspicious lesions Neurologic: Grossly intact, no focal deficits, moving all 4 extremities Psychiatric: Normal mood and affect   Hospital Course: Patient tolerated the procedure well.  She was then transferred to the floor after an uneventful PACU stay.  Her hospital course was uncomplicated.  On POD#1 she had met discharge criteria: was eating a regular diet, was up and ambulating independently,  pain was well controlled, catheter was draining well, JP drain was removed, and was ready for discharge.  Laboratory values:  Recent Labs    04/02/23 0643  WBC 13.0*  HGB 8.1*  HCT 24.4*   Recent Labs    04/02/23 0643  NA 135  K 4.2  CL 105  CO2 21*  GLUCOSE 116*  BUN 23  CREATININE 1.87*  CALCIUM 8.4*   Results for orders placed or performed during the hospital encounter of 03/27/23  Urine Culture     Status: Abnormal   Collection Time: 03/27/23  1:13 PM   Specimen: Urine, Clean Catch  Result Value Ref Range Status   Specimen Description   Final    URINE, CLEAN CATCH Performed at [facility], [address], [place], [place] [address]    Special Requests   Final    NONE Performed at [facility], [address] [place], [place] [address]    Culture MULTIPLE SPECIES PRESENT, SUGGEST RECOLLECTION (A)  Final   Report Status 03/28/2023 FINAL  Final   Disposition: Home  Discharge instruction: The patient was instructed to be ambulatory but told to refrain from heavy lifting, strenuous activity,  or driving.  Catheter care instructions provided by nursing.  Discharge medications:  Allergies as of 04/02/2023       Reactions   Codeine Nausea And Vomiting   Neosporin Wound Cleanser [benzalkonium Chloride] Other (See Comments)   Skin blisters   Penicillins Hives         Medication List     STOP taking these medications    meloxicam 7.5 MG tablet Commonly known as: MOBIC       TAKE these medications    acetaminophen 325 MG tablet Commonly known as: TYLENOL Take 650 mg by mouth every 6 (six) hours as needed.   calcium-vitamin D 500-200 MG-UNIT tablet Commonly known as: OSCAL WITH D 1 tablet daily with breakfast.   Cholecalciferol 25 MCG (1000 UT) tablet Take 1,000 Units by mouth daily.   cyanocobalamin 1000 MCG tablet Take 1,000 mcg by mouth daily.   docusate sodium 100 MG capsule Commonly known as: COLACE Take 1 capsule (100 mg total) by mouth 2 (two) times daily.   donepezil 10 MG tablet Commonly known as: ARICEPT TAKE 1 TABLET BY MOUTH ONCE EVERY EVENING   lidocaine-prilocaine cream Commonly known as: EMLA Apply to affected area once   memantine 5 MG tablet Commonly known as: NAMENDA Take 5 mg by mouth in the morning and at bedtime.   omeprazole 40 MG capsule Commonly known as: PRILOSEC 40 mg daily.   ondansetron 8 MG tablet Commonly known as: Zofran Take 1 tablet (8 mg total) by mouth every 8 (eight) hours as needed for nausea or vomiting. Start on the third day after cisplatin.   oxybutynin 5 MG tablet Commonly known as: DITROPAN Take 1 tablet (5 mg total) by mouth every 8 (eight) hours as needed for bladder spasms.   oxyCODONE-acetaminophen 5-325 MG tablet Commonly known as: PERCOCET/ROXICET Take 1-2 tablets by mouth every 6 (six) hours as needed for moderate pain.   prochlorperazine 10 MG tablet Commonly known as: COMPAZINE Take 1 tablet (10 mg total) by mouth every 6 (six) hours as needed (Nausea or vomiting).   raloxifene 60 MG tablet Commonly known as: EVISTA TAKE 1 TABLET BY MOUTH ONCE A DAY   rosuvastatin 5 MG tablet Commonly known as: CRESTOR Take 5 mg by mouth daily.        Followup:   Follow-up Information     Carman Ching, [place]. Go on 04/08/2023.   Specialty: Urology Why: For  postop cath removal and pathology review Contact information: [address] [place] [address] [phone]

## 2023-04-03 LAB — TYPE AND SCREEN

## 2023-04-04 LAB — TYPE AND SCREEN
ABO/RH(D): A NEG
Antibody Screen: NEGATIVE
Unit division: 0

## 2023-04-04 LAB — BPAM RBC
Blood Product Expiration Date: 202407082359
Unit Type and Rh: 600

## 2023-04-05 ENCOUNTER — Encounter: Payer: Self-pay | Admitting: Oncology

## 2023-04-05 LAB — SURGICAL PATHOLOGY

## 2023-04-08 ENCOUNTER — Telehealth: Payer: Self-pay | Admitting: Internal Medicine

## 2023-04-08 ENCOUNTER — Ambulatory Visit (INDEPENDENT_AMBULATORY_CARE_PROVIDER_SITE_OTHER): Payer: PPO | Admitting: Physician Assistant

## 2023-04-08 VITALS — BP 134/72 | HR 103 | Ht <= 58 in | Wt 101.6 lb

## 2023-04-08 DIAGNOSIS — C642 Malignant neoplasm of left kidney, except renal pelvis: Secondary | ICD-10-CM

## 2023-04-08 MED ORDER — SULFAMETHOXAZOLE-TRIMETHOPRIM 800-160 MG PO TABS
1.0000 | ORAL_TABLET | Freq: Once | ORAL | Status: AC
Start: 2023-04-08 — End: 2023-04-08
  Administered 2023-04-08: 1 via ORAL

## 2023-04-08 NOTE — Progress Notes (Signed)
Catheter Removal  Patient is present today for a catheter removal.  18ml of water was drained from the balloon. A 18FR three-way foley cath was removed from the bladder, no complications were noted. Patient tolerated well.  Performed by: Carman Ching, PA-C  Additional notes: Abdomen is soft without rigidity or rebound. Appropriate postoperative tenderness. Incisions c/d/I.   She is having BMs. No nausea, vomiting, LE swelling, chest pain, SOB.  1 dose Bactrim DS administered prior to Foley removal.  We discussed negative margins on surgical pathology.  Follow up/ Additional notes: Return for Postop f/u with Dr. Apolinar Junes.

## 2023-04-08 NOTE — Telephone Encounter (Signed)
I spoke to patient's daughter Joni Reining regarding the results of the brain MRI.  I left a message for Dr. Sherryll Burger.  Ladona Ridgel-  please reach out to Dr. Margaretmary Eddy office/[facility] neurology in the morning; and have his office reach out to me directly on my cell phone.  Thanks GB

## 2023-04-09 ENCOUNTER — Other Ambulatory Visit: Payer: Self-pay | Admitting: Physician Assistant

## 2023-04-09 ENCOUNTER — Telehealth: Payer: Self-pay | Admitting: Internal Medicine

## 2023-04-09 NOTE — Telephone Encounter (Signed)
I spoke with Dr. Sherryll Burger regarding patient's MRI.  MRI changes unlikely to explain patient's intermittent mental status changes.  Dr. Sherryll Burger started will reach out to the patient/daughter regarding a sooner appointment-discussed options for her change in mental status.  Taylor-please inform patient's daughter of above discussion with Dr. Sherryll Burger.  If any questions please direct her to Dr. Margaretmary Eddy office.  Thanks,  GB

## 2023-04-09 NOTE — Telephone Encounter (Signed)
Called Dr. Margaretmary Eddy office and left a message with the receptionist to have them call Dr. Donneta Romberg back directly on his cell phone in regards to MRI results.

## 2023-04-09 NOTE — Telephone Encounter (Signed)
Mychart message has been sent to daughter since that is the source of communication mainly used.

## 2023-04-17 ENCOUNTER — Other Ambulatory Visit: Payer: Self-pay | Admitting: Physician Assistant

## 2023-04-29 DIAGNOSIS — H2511 Age-related nuclear cataract, right eye: Secondary | ICD-10-CM | POA: Diagnosis not present

## 2023-04-29 DIAGNOSIS — H2512 Age-related nuclear cataract, left eye: Secondary | ICD-10-CM | POA: Diagnosis not present

## 2023-05-01 ENCOUNTER — Inpatient Hospital Stay: Payer: PPO

## 2023-05-02 ENCOUNTER — Encounter: Payer: Self-pay | Admitting: Ophthalmology

## 2023-05-02 DIAGNOSIS — M47816 Spondylosis without myelopathy or radiculopathy, lumbar region: Secondary | ICD-10-CM | POA: Diagnosis not present

## 2023-05-02 DIAGNOSIS — M419 Scoliosis, unspecified: Secondary | ICD-10-CM | POA: Diagnosis not present

## 2023-05-02 DIAGNOSIS — C689 Malignant neoplasm of urinary organ, unspecified: Secondary | ICD-10-CM | POA: Diagnosis not present

## 2023-05-02 DIAGNOSIS — G3184 Mild cognitive impairment, so stated: Secondary | ICD-10-CM | POA: Diagnosis not present

## 2023-05-02 DIAGNOSIS — M47817 Spondylosis without myelopathy or radiculopathy, lumbosacral region: Secondary | ICD-10-CM | POA: Diagnosis not present

## 2023-05-02 DIAGNOSIS — M545 Low back pain, unspecified: Secondary | ICD-10-CM | POA: Diagnosis not present

## 2023-05-02 DIAGNOSIS — G8929 Other chronic pain: Secondary | ICD-10-CM | POA: Diagnosis not present

## 2023-05-02 DIAGNOSIS — M5136 Other intervertebral disc degeneration, lumbar region: Secondary | ICD-10-CM | POA: Diagnosis not present

## 2023-05-02 DIAGNOSIS — Z87898 Personal history of other specified conditions: Secondary | ICD-10-CM | POA: Diagnosis not present

## 2023-05-02 NOTE — Anesthesia Preprocedure Evaluation (Addendum)
Anesthesia Evaluation  Patient identified by MRN, date of birth, ID band Patient awake  General Assessment Comment:  Patient AO x 3, however a little slow to respond, with occasional confusion. She does understand what she is having done today and the laterality. I believe she has capacity to consent, and daughter at bedside agreed.  Reviewed: Allergy & Precautions, NPO status , Patient's Chart, lab work & pertinent test results  History of Anesthesia Complications (+) PONV and history of anesthetic complications  Airway Mallampati: III  TM Distance: >3 FB Neck ROM: full    Dental  (+) Chipped, Dental Advidsory Given   Pulmonary neg pulmonary ROS, neg sleep apnea, neg COPD, Patient abstained from smoking.Not current smoker   Pulmonary exam normal breath sounds clear to auscultation       Cardiovascular Exercise Tolerance: Good hypertension, (-) CAD and (-) Past MI negative cardio ROS Normal cardiovascular exam(-) dysrhythmias  Rhythm:Regular Rate:Normal - Systolic murmurs 30-16-01 1. Left ventricular ejection fraction, by estimation, is 60 to 65%. The  left ventricle has normal function. The left ventricle has no regional  wall motion abnormalities. Left ventricular diastolic parameters are  consistent with Grade I diastolic  dysfunction (impaired relaxation).   2. Right ventricular systolic function is normal. The right ventricular  size is normal.   3. The posterior leaflet of the mitral valve is mildly restircted with  mild posterior MR. The mitral valve is normal in structure. Mild mitral  valve regurgitation. No evidence of mitral stenosis.   4. The aortic valve is tricuspid. There is mild calcification of the  aortic valve. Aortic valve regurgitation is trivial. Aortic valve  sclerosis/calcification is present, without any evidence of aortic  stenosis.   5. The inferior vena cava is normal in size with greater than 50%   respiratory variability, suggesting right atrial pressure of 3 mmHg.     Neuro/Psych       Dementia 04-06-23 MRI  IMPRESSION:  1. Redemonstrated are multiple predominantly cortical  microhemorrhages bilateral cerebral hemispheres with a new  microhemorrhage in the right frontal lobe and possibly the right  occipital lobe. These findings are nonspecific but can be seen in  the setting of amyloid angiopathy.  2. Small bilateral extra-axial, likely subdural, fluid collections  measuring to 2 mm, which are new from prior exam. These are  nonspecific and may represent subdural hygromas or chronic subdural  hematomas. Correlate with history of trauma. An additional  differential consideration is CSF hypotension, but there are no  other findings to confirm intracranial hypotension   negative neurological ROS  negative psych ROS   GI/Hepatic Neg liver ROS,GERD  Medicated and Controlled,,  Endo/Other  negative endocrine ROSneg diabetes    Renal/GU Renal diseaseRenal cancer     Musculoskeletal   Abdominal   Peds  Hematology negative hematology ROS (+)   Anesthesia Other Findings Endometriosis  Nephrolithiasis Hyperlipemia Fibrocystic disease of breast Raynaud disease  Thyromegaly Osteopenia  Hypertension Curvature of spine  Back pain Fibroid Kidney stone Osteoporosis GERD (gastroesophageal reflux disease) Pelvic pain in female  HLD (hyperlipidemia) Cancer Melanoma in situ  Basal cell carcinoma  Dysplastic nevus  History of kidney stones Pre-diabetes  Renal cancer, left (HCC) Osteoarthritis  B12 deficiency Memory loss  PONV (postoperative nausea and vomiting) Port-a-cath Grade I diastolic dysfunction Urothelial carcinoma of kidney, left    Reproductive/Obstetrics negative OB ROS  Anesthesia Physical Anesthesia Plan  ASA: 3  Anesthesia Plan: MAC   Post-op Pain Management:    Induction:  Intravenous  PONV Risk Score and Plan: 3 and Ondansetron, Midazolam and TIVA  Airway Management Planned: Natural Airway and Nasal Cannula  Additional Equipment:   Intra-op Plan:   Post-operative Plan:   Informed Consent: I have reviewed the patients History and Physical, chart, labs and discussed the procedure including the risks, benefits and alternatives for the proposed anesthesia with the patient or authorized representative who has indicated his/her understanding and acceptance.     Dental Advisory Given  Plan Discussed with: Anesthesiologist, CRNA and Surgeon  Anesthesia Plan Comments: (Patient consented for risks of anesthesia including but not limited to:  - adverse reactions to medications - damage to eyes, teeth, lips or other oral mucosa - nerve damage due to positioning  - sore throat or hoarseness - Damage to heart, brain, nerves, lungs, other parts of body or loss of life  Patient voiced understanding.)        Anesthesia Quick Evaluation

## 2023-05-06 ENCOUNTER — Inpatient Hospital Stay: Payer: PPO

## 2023-05-06 ENCOUNTER — Inpatient Hospital Stay: Payer: PPO | Attending: Oncology | Admitting: Oncology

## 2023-05-06 ENCOUNTER — Other Ambulatory Visit: Payer: PPO

## 2023-05-06 ENCOUNTER — Other Ambulatory Visit: Payer: Self-pay

## 2023-05-06 ENCOUNTER — Encounter: Payer: Self-pay | Admitting: Oncology

## 2023-05-06 VITALS — BP 129/73 | HR 66 | Temp 98.0°F | Resp 16 | Ht <= 58 in | Wt 97.0 lb

## 2023-05-06 DIAGNOSIS — E875 Hyperkalemia: Secondary | ICD-10-CM | POA: Insufficient documentation

## 2023-05-06 DIAGNOSIS — C642 Malignant neoplasm of left kidney, except renal pelvis: Secondary | ICD-10-CM | POA: Insufficient documentation

## 2023-05-06 DIAGNOSIS — C689 Malignant neoplasm of urinary organ, unspecified: Secondary | ICD-10-CM

## 2023-05-06 DIAGNOSIS — Z95828 Presence of other vascular implants and grafts: Secondary | ICD-10-CM

## 2023-05-06 LAB — CMP (CANCER CENTER ONLY)
ALT: 32 U/L (ref 0–44)
AST: 20 U/L (ref 15–41)
Albumin: 4.1 g/dL (ref 3.5–5.0)
Alkaline Phosphatase: 110 U/L (ref 38–126)
Anion gap: 9 (ref 5–15)
BUN: 43 mg/dL — ABNORMAL HIGH (ref 8–23)
CO2: 20 mmol/L — ABNORMAL LOW (ref 22–32)
Calcium: 9.2 mg/dL (ref 8.9–10.3)
Chloride: 109 mmol/L (ref 98–111)
Creatinine: 1.86 mg/dL — ABNORMAL HIGH (ref 0.44–1.00)
GFR, Estimated: 28 mL/min — ABNORMAL LOW (ref >60)
Glucose, Bld: 101 mg/dL — ABNORMAL HIGH (ref 70–99)
Potassium: 5.3 mmol/L — ABNORMAL HIGH (ref 3.5–5.1)
Sodium: 138 mmol/L (ref 135–145)
Total Bilirubin: 0.4 mg/dL (ref 0.3–1.2)
Total Protein: 6.3 g/dL — ABNORMAL LOW (ref 6.5–8.1)

## 2023-05-06 LAB — CBC WITH DIFFERENTIAL/PLATELET
Abs Immature Granulocytes: 0.04 10*3/uL (ref 0.00–0.07)
Basophils Absolute: 0 10*3/uL (ref 0.0–0.1)
Basophils Relative: 1 %
Eosinophils Absolute: 0.2 10*3/uL (ref 0.0–0.5)
Eosinophils Relative: 3 %
HCT: 31.2 % — ABNORMAL LOW (ref 36.0–46.0)
Hemoglobin: 9.7 g/dL — ABNORMAL LOW (ref 12.0–15.0)
Immature Granulocytes: 1 %
Lymphocytes Relative: 21 %
Lymphs Abs: 1.5 10*3/uL (ref 0.7–4.0)
MCH: 30.3 pg (ref 26.0–34.0)
MCHC: 31.1 g/dL (ref 30.0–36.0)
MCV: 97.5 fL (ref 80.0–100.0)
Monocytes Absolute: 0.7 10*3/uL (ref 0.1–1.0)
Monocytes Relative: 9 %
Neutro Abs: 5 10*3/uL (ref 1.7–7.7)
Neutrophils Relative %: 65 %
Platelets: 150 10*3/uL (ref 150–400)
RBC: 3.2 MIL/uL — ABNORMAL LOW (ref 3.87–5.11)
RDW: 13.7 % (ref 11.5–15.5)
WBC: 7.5 10*3/uL (ref 4.0–10.5)
nRBC: 0 % (ref 0.0–0.2)

## 2023-05-06 LAB — MAGNESIUM: Magnesium: 2.2 mg/dL (ref 1.7–2.4)

## 2023-05-06 LAB — SAMPLE TO BLOOD BANK

## 2023-05-06 MED ORDER — SODIUM CHLORIDE 0.9% FLUSH
10.0000 mL | Freq: Once | INTRAVENOUS | Status: AC
  Administered 2023-05-06: 10 mL via INTRAVENOUS
  Filled 2023-05-06: qty 10

## 2023-05-06 MED ORDER — HEPARIN SOD (PORK) LOCK FLUSH 100 UNIT/ML IV SOLN
500.0000 [IU] | Freq: Once | INTRAVENOUS | Status: AC
  Administered 2023-05-06: 500 [IU] via INTRAVENOUS
  Filled 2023-05-06: qty 5

## 2023-05-06 NOTE — Progress Notes (Signed)
Wants to know when she can have port removed

## 2023-05-06 NOTE — Progress Notes (Signed)
[facility]  [phone] [phone]  ID: Jessica Morrison OB: 21-Jun-1946  MR#: [id number]  TKZ#:[id number]  Patient Care Team: Marguarite Arbour, MD as PCP - General (Internal Medicine) Maisie Fus, MD as PCP - Cardiology (Cardiology)  CHIEF COMPLAINT: Urothelial carcinoma of the kidney.  INTERVAL HISTORY: Patient returns to clinic today for 1 month postoperative follow-up.  She tolerated her surgery well without significant side effects.  Her COVID did decline and memory continues to be a major issue, but she otherwise feels well. She has no neurologic complaints.  She denies any recent fevers or illnesses. She has no chest pain, shortness of breath, cough, or hemoptysis.  She denies any nausea, vomiting, constipation, or diarrhea.  She has no urinary complaints.  Patient offers no specific complaints today.  REVIEW OF SYSTEMS:   Review of Systems  Constitutional: Negative.  Negative for fever, malaise/fatigue and weight loss.  Respiratory: Negative.  Negative for cough and shortness of breath.   Cardiovascular: Negative.  Negative for chest pain and leg swelling.  Gastrointestinal: Negative.  Negative for abdominal pain.  Genitourinary: Negative.  Negative for hematuria.  Musculoskeletal: Negative.  Negative for back pain.  Skin: Negative.  Negative for rash.  Neurological: Negative.  Negative for dizziness, focal weakness, weakness and headaches.  Psychiatric/Behavioral:  Positive for memory loss. The patient is not nervous/anxious.     As per HPI. Otherwise, a complete review of systems is negative.  PAST MEDICAL HISTORY: Past Medical History:  Diagnosis Date   B12 deficiency    Back pain    Basal cell carcinoma 08/04/2020   0.5cm above the left mid brow, EDC 09/15/20   Cancer (HCC)    Melanoma   Curvature of spine    Dysplastic nevus 04/30/2013   left post shoulder, left parasternal   Dysplastic nevus 05/04/2014   right prox  ant deltoid, left lat buttock   Dysplastic nevus 03/26/2019   right lat neck adjacent to the inf top of MM in situ site scar   Dysplastic nevus 10/29/2019   upper back left paraspinal at level of mid scapula   Dysplastic nevus 03/31/2020, 05/12/20 shave removal   Left mid pretibial. Moderate to severe atypia, deep margin involved.   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   right lat neck inf to right angle of mandible, excised: 07/29/2018, margins free   Memory loss    Mild mitral regurgitation by prior echocardiogram    Nephrolithiasis    Osteoarthritis    Osteopenia    Osteoporosis    Pelvic pain in female    PONV (postoperative nausea and vomiting)    Pre-diabetes    Raynaud disease    Renal cancer, left (HCC) 03/2023   Thyromegaly    Transitional cell carcinoma of kidney, left (HCC)    Urothelial carcinoma of kidney, left (HCC)     PAST SURGICAL HISTORY: Past Surgical History:  Procedure Laterality Date   ABDOMINAL HYSTERECTOMY     BILATERAL OOPHORECTOMY  1999   BLADDER INSTILLATION N/A 04/01/2023   Procedure: BLADDER INSTILLATION OF GEMCITABINE;  Surgeon: Vanna Scotland, MD;  Location: [facility] ORS;  Service: Urology;  Laterality: N/A;   CESAREAN SECTION     COLONOSCOPY  09/02/2020   COLONOSCOPY WITH PROPOFOL N/A 05/09/2015   Procedure: COLONOSCOPY WITH PROPOFOL;  Surgeon: Elnita Maxwell, MD;  Location: [facility] ENDOSCOPY;  Service: Endoscopy;  Laterality: N/A;   CYSTOSCOPY WITH RETROGRADE PYELOGRAM, URETEROSCOPY AND STENT PLACEMENT Left 10/09/2022   Procedure: CYSTOSCOPY WITH RETROGRADE PYELOGRAM, URETEROSCOPY AND STENT PLACEMENT;  Surgeon: Riki Altes, MD;  Location: [facility] ORS;  Service: Urology;  Laterality: Left;   IR IMAGING GUIDED PORT INSERTION  12/06/2022   MELANOMA EXCISION Right 07/29/2018   lateral neck inf. to right angle of mandible   OOPHORECTOMY     ROBOT ASSITED LAPAROSCOPIC NEPHROURETERECTOMY Left 04/01/2023   Procedure: XI ROBOT ASSITED LAPAROSCOPIC NEPHROURETERECTOMY;  Surgeon: Vanna Scotland, MD;  Location: [facility] ORS;  Service: Urology;  Laterality: Left;   URETERAL BIOPSY Left 10/09/2022   Procedure: URETERAL BIOPSY;  Surgeon: Riki Altes, MD;  Location: [facility] ORS;  Service: Urology;  Laterality: Left;    FAMILY HISTORY: Family History  Problem Relation Age of Onset   Emphysema Father    Cancer Neg Hx    Heart disease Neg Hx    Diabetes Neg Hx    Breast cancer Neg Hx     ADVANCED DIRECTIVES (Y/N):  N  HEALTH MAINTENANCE: Social History   Tobacco Use   Smoking status: Never    Passive exposure: Never   Smokeless tobacco: Never  Vaping Use   Vaping status: Never Used  Substance Use Topics   Alcohol use: No   Drug use: No     Colonoscopy:  PAP:  Bone density:  Lipid panel:  Allergies  Allergen Reactions   Codeine Nausea And Vomiting   Neosporin Wound Cleanser [Benzalkonium Chloride] Other (See Comments)    Skin blisters   Penicillins Hives    Current Outpatient Medications  Medication Sig Dispense Refill   acetaminophen (TYLENOL) 325 MG tablet Take 650 mg by mouth every 6 (six) hours as needed.     calcium-vitamin D (OSCAL WITH D) 500-200 MG-UNIT tablet 1 tablet daily with breakfast.     Cholecalciferol 25 MCG (1000 UT) tablet Take 1,000 Units by mouth daily.     cyanocobalamin 1000 MCG tablet Take 1,000 mcg by mouth daily.     docusate sodium (COLACE) 100 MG capsule Take 1 capsule (100 mg total) by mouth 2 (two) times daily. 10 capsule 0   donepezil (ARICEPT) 10 MG tablet TAKE 1 TABLET BY MOUTH ONCE EVERY EVENING     lidocaine-prilocaine (EMLA) cream Apply to affected area once  30 g 3   memantine (NAMENDA) 5 MG tablet Take 5 mg by mouth in the morning and at bedtime.     omeprazole (PRILOSEC) 40 MG capsule 40 mg daily.     ondansetron (ZOFRAN) 8 MG tablet Take 1 tablet (8 mg total) by mouth every 8 (eight) hours as needed for nausea or vomiting. Start on the third day after cisplatin. 60 tablet 2   oxybutynin (DITROPAN) 5 MG tablet TAKE ONE TABLET (5 MG TOTAL) BY MOUTH EVERY EIGHT HOURS AS NEEDED FOR BLADDER SPASMS. 30 tablet 0   prochlorperazine (COMPAZINE) 10 MG tablet Take 1 tablet (10 mg total) by mouth every 6 (six) hours as needed (Nausea or vomiting). 60 tablet 2   raloxifene (EVISTA) 60 MG tablet TAKE 1 TABLET BY MOUTH ONCE A DAY 90 tablet 1   rosuvastatin (CRESTOR) 5  MG tablet Take 5 mg by mouth daily.     No current facility-administered medications for this visit.    OBJECTIVE: Vitals:   05/06/23 1040  BP: 129/73  Pulse: 66  Resp: 16  Temp: 98 F (36.7 C)  SpO2: 100%     Body mass index is 20.99 kg/m.    ECOG FS:1 - Symptomatic but completely ambulatory  General: Well-developed, well-nourished, no acute distress. Eyes: Pink conjunctiva, anicteric sclera. HEENT: Normocephalic, moist mucous membranes. Lungs: No audible wheezing or coughing. Heart: Regular rate and rhythm. Abdomen: Soft, nontender, no obvious distention. Musculoskeletal: No edema, cyanosis, or clubbing. Neuro: Alert, answering all questions appropriately. Cranial nerves grossly intact. Skin: No rashes or petechiae noted. Psych: Normal affect.  LAB RESULTS:  Lab Results  Component Value Date   NA 135 04/02/2023   K 4.2 04/02/2023   CL 105 04/02/2023   CO2 21 (L) 04/02/2023   GLUCOSE 116 (H) 04/02/2023   BUN 23 04/02/2023   CREATININE 1.87 (H) 04/02/2023   CALCIUM 8.4 (L) 04/02/2023   PROT 5.8 (L) 03/19/2023   ALBUMIN 3.6 03/19/2023   AST 22 03/19/2023   ALT 15 03/19/2023   ALKPHOS 41 03/19/2023   BILITOT 0.5 03/19/2023   GFRNONAA 27 (L) 04/02/2023    Lab  Results  Component Value Date   WBC 13.0 (H) 04/02/2023   NEUTROABS 5.7 02/19/2023   HGB 8.1 (L) 04/02/2023   HCT 24.4 (L) 04/02/2023   MCV 92.1 04/02/2023   PLT 103 (L) 04/02/2023     STUDIES: No results found.  ASSESSMENT: Urothelial carcinoma of the kidney.  PLAN:    Urothelial carcinoma of the kidney: Confirmed by biopsy.  PET scan results on November 14, 2022 reviewed independently with no significant hypermetabolism of known malignancy.  Imaging does not reveal any metastatic disease.  Patient completed 4 cycles of neoadjuvant cisplatin and gemcitabine on Feb 19, 2023.  She underwent complete R0 surgical resection on April 01, 2023.  No further intervention is needed.  Repeat imaging with CT scan in 3 months. Memory loss: By report acute onset.  Continue follow-up with neurology as indicated.   Renal insufficiency: Chronic and unchanged.  Patient's GFR is 28.  Will need to be above 30 to use contrast.   Hypokalemia: Patient now has a mild hyperkalemia. Anemia: Hemoglobin trending up to 9.7, monitor.   Thrombocytopenia: Resolved.  Patient expressed understanding and was in agreement with this plan. She also understands that She can call clinic at any time with any questions, concerns, or complaints.    Cancer Staging  Urothelial carcinoma of kidney [facility]) Staging form: Kidney, AJCC 8th Edition - Clinical stage from 12/01/2022: Stage Unknown (cTX, cN0, cM0) - Signed by Jeralyn Ruths, MD on 12/01/2022 Stage prefix: Initial diagnosis Histologic grade (G): G3 Histologic grading system: 4 grade system   Jeralyn Ruths, MD   05/06/2023 10:56 AM

## 2023-05-07 NOTE — Discharge Instructions (Signed)

## 2023-05-09 ENCOUNTER — Other Ambulatory Visit: Payer: Self-pay

## 2023-05-09 ENCOUNTER — Ambulatory Visit: Payer: PPO | Admitting: Anesthesiology

## 2023-05-09 ENCOUNTER — Encounter
Admission: RE | Disposition: A | Discharge status: Home / Self Care | Payer: Self-pay | Source: Home / Self Care | Attending: Ophthalmology

## 2023-05-09 ENCOUNTER — Ambulatory Visit
Admission: RE | Admit: 2023-05-09 | Discharge: 2023-05-09 | Disposition: A | Discharge status: Home / Self Care | Payer: PPO | Attending: Ophthalmology | Admitting: Ophthalmology

## 2023-05-09 ENCOUNTER — Encounter: Payer: Self-pay | Admitting: Ophthalmology

## 2023-05-09 DIAGNOSIS — H2512 Age-related nuclear cataract, left eye: Secondary | ICD-10-CM | POA: Diagnosis not present

## 2023-05-09 DIAGNOSIS — H2511 Age-related nuclear cataract, right eye: Secondary | ICD-10-CM | POA: Diagnosis not present

## 2023-05-09 HISTORY — PX: CATARACT EXTRACTION W/PHACO: SHX586

## 2023-05-09 HISTORY — DX: Nonrheumatic mitral (valve) insufficiency: I34.0

## 2023-05-09 HISTORY — DX: Other ill-defined heart diseases: I51.89

## 2023-05-09 HISTORY — DX: Malignant neoplasm of left kidney, except renal pelvis: C64.2

## 2023-05-09 SURGERY — PHACOEMULSIFICATION, CATARACT, WITH IOL INSERTION
Anesthesia: Monitor Anesthesia Care | Laterality: Right

## 2023-05-09 MED ORDER — MIDAZOLAM HCL 2 MG/2ML IJ SOLN
INTRAMUSCULAR | Status: DC | PRN
  Administered 2023-05-09: 1 mg via INTRAVENOUS

## 2023-05-09 MED ORDER — TETRACAINE HCL 0.5 % OP SOLN
1.0000 [drp] | OPHTHALMIC | Status: DC | PRN
  Administered 2023-05-09 (×3): 1 [drp] via OPHTHALMIC

## 2023-05-09 MED ORDER — SODIUM CHLORIDE 0.9% FLUSH
INTRAVENOUS | Status: DC | PRN
  Administered 2023-05-09: 10 mL via INTRAVENOUS

## 2023-05-09 MED ORDER — LACTATED RINGERS IV SOLN: INTRAVENOUS | Status: DC

## 2023-05-09 MED ORDER — FENTANYL CITRATE (PF) 100 MCG/2ML IJ SOLN
INTRAMUSCULAR | Status: DC | PRN
  Administered 2023-05-09: 50 ug via INTRAVENOUS

## 2023-05-09 MED ORDER — LIDOCAINE HCL (PF) 2 % IJ SOLN
INTRAOCULAR | Status: DC | PRN
  Administered 2023-05-09: 1 mL via INTRAOCULAR

## 2023-05-09 MED ORDER — BRIMONIDINE TARTRATE-TIMOLOL 0.2-0.5 % OP SOLN
OPHTHALMIC | Status: DC | PRN
  Administered 2023-05-09: 1 [drp] via OPHTHALMIC

## 2023-05-09 MED ORDER — ARMC OPHTHALMIC DILATING DROPS
1.0000 | OPHTHALMIC | Status: DC | PRN
  Administered 2023-05-09 (×3): 1 via OPHTHALMIC

## 2023-05-09 MED ORDER — SIGHTPATH DOSE#1 BSS IO SOLN
INTRAOCULAR | Status: DC | PRN
  Administered 2023-05-09: 15 mL via INTRAOCULAR

## 2023-05-09 MED ORDER — MOXIFLOXACIN HCL 0.5 % OP SOLN
OPHTHALMIC | Status: DC | PRN
  Administered 2023-05-09: .2 mL via OPHTHALMIC

## 2023-05-09 MED ORDER — SIGHTPATH DOSE#1 NA HYALUR & NA CHOND-NA HYALUR IO KIT
PACK | INTRAOCULAR | Status: DC | PRN
  Administered 2023-05-09: 1 via OPHTHALMIC

## 2023-05-09 MED ORDER — PHENYLEPHRINE-KETOROLAC 1-0.3 % IO SOLN
INTRAOCULAR | Status: DC | PRN
  Administered 2023-05-09: 98 mL via OPHTHALMIC

## 2023-05-09 SURGICAL SUPPLY — 20 items
BNDG EYE OVAL 2 1/8 X 2 5/8 (GAUZE/BANDAGES/DRESSINGS) IMPLANT
CANNULA ANT/CHMB 27G (MISCELLANEOUS) IMPLANT
CANNULA ANT/CHMB 27GA (MISCELLANEOUS)
CATARACT SUITE SIGHTPATH (MISCELLANEOUS) ×1
DISSECTOR HYDRO NUCLEUS 50X22 (MISCELLANEOUS) ×1 IMPLANT
DRSG TEGADERM 2-3/8X2-3/4 SM (GAUZE/BANDAGES/DRESSINGS) ×1 IMPLANT
FEE CATARACT SUITE SIGHTPATH (MISCELLANEOUS) ×1 IMPLANT
GLOVE SURG SYN 7.5 E (GLOVE) ×1
GLOVE SURG SYN 7.5 PF PI (GLOVE) ×1 IMPLANT
GLOVE SURG SYN 8.5 E (GLOVE) ×1
GLOVE SURG SYN 8.5 PF PI (GLOVE) ×1 IMPLANT
LENS IOL TECNIS EYHANCE 24.5 (Intraocular Lens) IMPLANT
NDL FILTER BLUNT 18X1 1/2 (NEEDLE) IMPLANT
NEEDLE FILTER BLUNT 18X1 1/2 (NEEDLE) IMPLANT
PACK VIT ANT 23G (MISCELLANEOUS) IMPLANT
RING MALYGIN 7.0 (MISCELLANEOUS) IMPLANT
SUT ETHILON 10-0 CS-B-6CS-B-6 (SUTURE)
SUTURE EHLN 10-0 CS-B-6CS-B-6 (SUTURE) IMPLANT
SYR 3ML LL SCALE MARK (SYRINGE) IMPLANT
SYR 5ML LL (SYRINGE) IMPLANT

## 2023-05-09 NOTE — H&P (Signed)
[facility]   Primary Care Physician:  Marguarite Arbour, MD Ophthalmologist: Dr. Deberah Pelton  Pre-Procedure History & Physical: HPI:  Jessica Morrison is a 77 y.o. female here for cataract surgery.   Past Medical History:  Diagnosis Date   B12 deficiency    Back pain    Basal cell carcinoma 08/04/2020   0.5cm above the left mid brow, EDC 09/15/20   Cancer (HCC)    Melanoma   Curvature of spine    Dysplastic nevus 04/30/2013   left post shoulder, left parasternal   Dysplastic nevus 05/04/2014   right prox ant deltoid, left lat buttock   Dysplastic nevus 03/26/2019   right lat neck adjacent to the inf top of MM in situ site scar   Dysplastic nevus 10/29/2019   upper back left paraspinal at level of mid scapula   Dysplastic nevus 03/31/2020, 05/12/20 shave removal   Left mid pretibial. Moderate to severe atypia, deep margin involved.   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   right lat neck inf to right angle of mandible, excised: 07/29/2018, margins free   Memory loss    Mild mitral regurgitation by prior echocardiogram    Nephrolithiasis    Osteoarthritis    Osteopenia    Osteoporosis    Pelvic pain in female    PONV (postoperative nausea and vomiting)    Pre-diabetes    Raynaud disease    Renal cancer, left (HCC) 03/2023   Thyromegaly    Transitional cell carcinoma of kidney, left (HCC)    Urothelial carcinoma of kidney, left (HCC)     Past Surgical History:  Procedure Laterality Date   ABDOMINAL HYSTERECTOMY     BILATERAL OOPHORECTOMY  1999   BLADDER INSTILLATION N/A 04/01/2023   Procedure: BLADDER INSTILLATION OF GEMCITABINE;   Surgeon: Vanna Scotland, MD;  Location: [facility] ORS;  Service: Urology;  Laterality: N/A;   CESAREAN SECTION     COLONOSCOPY  09/02/2020   COLONOSCOPY WITH PROPOFOL N/A 05/09/2015   Procedure: COLONOSCOPY WITH PROPOFOL;  Surgeon: Elnita Maxwell, MD;  Location: [facility] ENDOSCOPY;  Service: Endoscopy;  Laterality: N/A;   CYSTOSCOPY WITH RETROGRADE PYELOGRAM, URETEROSCOPY AND STENT PLACEMENT Left 10/09/2022   Procedure: CYSTOSCOPY WITH RETROGRADE PYELOGRAM, URETEROSCOPY AND STENT PLACEMENT;  Surgeon: Riki Altes, MD;  Location: [facility] ORS;  Service: Urology;  Laterality: Left;   IR IMAGING GUIDED PORT INSERTION  12/06/2022   MELANOMA EXCISION Right 07/29/2018   lateral neck inf. to right angle of mandible   OOPHORECTOMY     ROBOT ASSITED LAPAROSCOPIC NEPHROURETERECTOMY Left 04/01/2023   Procedure: XI ROBOT ASSITED LAPAROSCOPIC NEPHROURETERECTOMY;  Surgeon: Vanna Scotland, MD;  Location: [facility] ORS;  Service: Urology;  Laterality: Left;   URETERAL BIOPSY Left 10/09/2022   Procedure: URETERAL BIOPSY;  Surgeon: Riki Altes, MD;  Location: [facility] ORS;  Service: Urology;  Laterality: Left;    Prior to Admission medications   Medication Sig Start Date End Date Taking? Authorizing Provider  acetaminophen (TYLENOL) 325 MG tablet Take 650 mg by mouth every 6 (six) hours as needed.    [provider]  calcium-vitamin D (OSCAL WITH D) 500-200 MG-UNIT tablet 1 tablet daily with breakfast.    [provider]  Cholecalciferol 25 MCG (1000 UT) tablet Take 1,000 Units by mouth daily.    [provider]  cyanocobalamin 1000 MCG tablet Take 1,000 mcg by mouth daily.    [provider]  docusate sodium (COLACE) 100 MG capsule Take 1 capsule (100 mg total) by mouth 2 (two) times daily. 04/02/23   Vaillancourt, Lelon Mast, PA-C  donepezil (ARICEPT) 10 MG tablet TAKE 1 TABLET BY MOUTH ONCE EVERY EVENING 08/06/21   [provider]  lidocaine-prilocaine (EMLA) cream Apply  to affected area once 12/01/22   Jeralyn Ruths, MD  memantine (NAMENDA) 5 MG tablet Take 5 mg by mouth in the morning and at bedtime. 05/10/21   [provider]  omeprazole (PRILOSEC) 40 MG capsule 40 mg daily. 10/29/19   [provider]  ondansetron (ZOFRAN) 8 MG tablet Take 1 tablet (8 mg total) by mouth every 8 (eight) hours as needed for nausea or vomiting. Start on the third day after cisplatin. 12/01/22   Jeralyn Ruths, MD  oxybutynin (DITROPAN) 5 MG tablet TAKE ONE TABLET (5 MG TOTAL) BY MOUTH EVERY EIGHT HOURS AS NEEDED FOR BLADDER SPASMS. 04/17/23   Vaillancourt, Lelon Mast, PA-C  prochlorperazine (COMPAZINE) 10 MG tablet Take 1 tablet (10 mg total) by mouth every 6 (six) hours as needed (Nausea or vomiting). 12/01/22   Jeralyn Ruths, MD  raloxifene (EVISTA) 60 MG tablet TAKE 1 TABLET BY MOUTH ONCE A DAY 11/29/22   Linzie Collin, MD  rosuvastatin (CRESTOR) 5 MG tablet Take 5 mg by mouth daily.    [provider]    Allergies as of 03/04/2023 - Review Complete 02/28/2023  Allergen Reaction Noted   Codeine Nausea And Vomiting 05/06/2015   Neosporin wound cleanser [benzalkonium chloride] Other (See Comments) 05/06/2015   Penicillins  05/06/2015    Family History  Problem Relation Age of Onset   Emphysema Father    Cancer Neg Hx    Heart disease Neg Hx    Diabetes Neg Hx    Breast cancer Neg Hx     Social History   Socioeconomic History   Marital status: Widowed    Spouse name: Not on file   Number of children: Not on file   Years of education: Not on file   Highest education level: Not on file  Occupational History   Not on file  Tobacco Use   Smoking status: Never    Passive exposure: Never   Smokeless tobacco: Never  Vaping Use   Vaping status: Never Used  Substance and Sexual Activity   Alcohol use: No   Drug use: No   Sexual activity: Not Currently    Birth control/protection: Surgical  Other Topics Concern   Not on  file  Social History Narrative   Not on file   Social Determinants of Health   Financial Resource Strain: Low Risk  (02/04/2023)   [facility]   Overall Financial Resource Strain (CARDIA)    Difficulty of Paying Living Expenses: Not hard at all  Food Insecurity: No Food Insecurity (04/01/2023)   Hunger Vital Sign    Worried About Running Out of Food in the Last Year: Never true    Ran Out of Food in the Last Year: Never true  Transportation Needs: No Transportation Needs (04/01/2023)   PRAPARE - Transportation  Lack of Transportation (Medical): No    Lack of Transportation (Non-Medical): No  Physical Activity: Insufficiently Active (08/27/2017)   Exercise Vital Sign    Days of Exercise per Week: 2 days    Minutes of Exercise per Session: 60 min  Stress: Stress Concern Present (08/27/2017)   [employer] of Occupational Health - Occupational Stress Questionnaire    Feeling of Stress : To some extent  Social Connections: Not on file  Intimate Partner Violence: Not At Risk (04/01/2023)   Humiliation, Afraid, Rape, and Kick questionnaire    Fear of Current or Ex-Partner: No    Emotionally Abused: No    Physically Abused: No    Sexually Abused: No    Review of Systems: See HPI, otherwise negative ROS  Physical Exam: Ht 4' 9.01" (1.448 m)   Wt 45.4 kg   BMI 21.63 kg/m  General:   Alert, cooperative in NAD Head:  Normocephalic and atraumatic. Respiratory:  Normal work of breathing. Cardiovascular:  RRR  Impression/Plan: Jessica Morrison is here for cataract surgery.  Risks, benefits, limitations, and alternatives regarding cataract surgery have been reviewed with the patient.  Questions have been answered.  All parties agreeable.   Estanislado Pandy, MD  05/09/2023, 11:45 AM

## 2023-05-09 NOTE — Op Note (Signed)
OPERATIVE NOTE  Jessica Morrison [id number] 05/09/2023   PREOPERATIVE DIAGNOSIS: Nuclear sclerotic cataract right eye. H25.11   POSTOPERATIVE DIAGNOSIS: Nuclear sclerotic cataract right eye. H25.11   PROCEDURE:  Phacoemusification with posterior chamber intraocular lens placement of the right eye  Ultrasound time: Procedure(s): CATARACT EXTRACTION PHACO AND INTRAOCULAR LENS PLACEMENT (IOC) RIGHT MALYUGIN OMIDRIA 7.79 00:59.0 (Right)  LENS:   Implant Name Type Inv. Item Serial No. Manufacturer Lot No. LRB No. Used Action  LENS IOL TECNIS EYHANCE 24.5 - [id number] Intraocular Lens LENS IOL TECNIS EYHANCE 24.5 [id number] [employer]  Right 1 Implanted      SURGEON:  Julious Payer. Rolley Sims, MD   ANESTHESIA:  Topical with tetracaine drops, augmented with 1% preservative-free intracameral lidocaine.   COMPLICATIONS:  None.   DESCRIPTION OF PROCEDURE:  The patient was identified in the holding room and transported to the operating room and placed in the supine position under the operating microscope.  The right eye was identified as the operative eye, which was prepped and draped in the usual sterile ophthalmic fashion.   A 1 millimeter clear-corneal paracentesis was made superotemporally. Preservative-free 1% lidocaine mixed with 1:1,000 bisulfite-free aqueous solution of epinephrine was injected into the anterior chamber. The anterior chamber was then filled with Viscoat viscoelastic. A 2.4 millimeter keratome was used to make a clear-corneal incision inferotemporally. A curvilinear capsulorrhexis was made with a cystotome and capsulorrhexis forceps. Balanced salt solution was used to hydrodissect and hydrodelineate the nucleus. Phacoemulsification was then used to remove the lens nucleus and epinucleus. The remaining cortex was then removed using the irrigation and aspiration handpiece. Provisc was then placed into the capsular bag to distend it for lens placement. A +24.50 D DIB00 intraocular lens  was then injected into the capsular bag. The remaining viscoelastic was aspirated.   Wounds were hydrated with balanced salt solution.  The anterior chamber was inflated to a physiologic pressure with balanced salt solution.  No wound leaks were noted. Vigamox was injected intracamerally.  Timolol and Brimonidine drops were applied to the eye.  The patient was taken to the recovery room in stable condition without complications of anesthesia or surgery.  [name] 05/09/2023, 3:03 PM

## 2023-05-09 NOTE — Transfer of Care (Signed)
Immediate Anesthesia Transfer of Care Note  Patient: Jessica Morrison  Procedure(s) Performed: CATARACT EXTRACTION PHACO AND INTRAOCULAR LENS PLACEMENT (IOC) RIGHT MALYUGIN OMIDRIA 7.79 00:59.0 (Right)  Patient Location: PACU  Anesthesia Type:MAC  Level of Consciousness: awake and alert   Airway & Oxygen Therapy: Patient Spontanous Breathing  Post-op Assessment: Report given to RN and Post -op Vital signs reviewed and stable  Post vital signs: Reviewed and stable  Last Vitals:  Vitals Value Taken Time  BP 141/83 05/09/23 1505  Temp 36.4 C 05/09/23 1505  Pulse 78 05/09/23 1508  Resp 14 05/09/23 1508  SpO2 100 % 05/09/23 1508  Vitals shown include unfiled device data.  Last Pain:  Vitals:   05/09/23 1505  TempSrc:   PainSc: 0-No pain         Complications: No notable events documented.

## 2023-05-10 ENCOUNTER — Encounter: Payer: Self-pay | Admitting: Ophthalmology

## 2023-05-10 DIAGNOSIS — H2512 Age-related nuclear cataract, left eye: Secondary | ICD-10-CM | POA: Diagnosis not present

## 2023-05-10 NOTE — Anesthesia Postprocedure Evaluation (Signed)
Anesthesia Post Note  Patient: Jessica Morrison  Procedure(s) Performed: CATARACT EXTRACTION PHACO AND INTRAOCULAR LENS PLACEMENT (IOC) RIGHT MALYUGIN OMIDRIA 7.79 00:59.0 (Right)  Patient location during evaluation: PACU Anesthesia Type: MAC Level of consciousness: awake and alert Pain management: pain level controlled Vital Signs Assessment: post-procedure vital signs reviewed and stable Respiratory status: spontaneous breathing, nonlabored ventilation, respiratory function stable and patient connected to nasal cannula oxygen Cardiovascular status: blood pressure returned to baseline and stable Postop Assessment: no apparent nausea or vomiting Anesthetic complications: no  No notable events documented.   Last Vitals:  Vitals:   05/09/23 1505 05/09/23 1510  BP: (!) 141/83 (!) 149/76  Pulse: 80 78  Resp: 10 16  Temp: (!) 36.4 C (!) 36.4 C  SpO2: 100% 100%    Last Pain:  Vitals:   05/09/23 1510  TempSrc:   PainSc: 0-No pain                 Stephanie Coup

## 2023-05-13 NOTE — Anesthesia Preprocedure Evaluation (Addendum)
Anesthesia Evaluation  Patient identified by MRN, date of birth, ID band Patient awake    Reviewed: Allergy & Precautions, NPO status , Patient's Chart, lab work & pertinent test results  History of Anesthesia Complications (+) PONV and history of anesthetic complications  Airway Mallampati: III  TM Distance: >3 FB Neck ROM: full    Dental  (+) Chipped   Pulmonary neg pulmonary ROS, neg sleep apnea, neg COPD, Patient abstained from smoking.Not current smoker   Pulmonary exam normal breath sounds clear to auscultation       Cardiovascular Exercise Tolerance: Good hypertension, (-) CAD and (-) Past MI negative cardio ROS Normal cardiovascular exam(-) dysrhythmias  Rhythm:Regular Rate:Normal - Systolic murmurs 78-29-56 1. Left ventricular ejection fraction, by estimation, is 60 to 65%. The  left ventricle has normal function. The left ventricle has no regional  wall motion abnormalities. Left ventricular diastolic parameters are  consistent with Grade I diastolic  dysfunction (impaired relaxation).   2. Right ventricular systolic function is normal. The right ventricular  size is normal.   3. The posterior leaflet of the mitral valve is mildly restircted with  mild posterior MR. The mitral valve is normal in structure. Mild mitral  valve regurgitation. No evidence of mitral stenosis.   4. The aortic valve is tricuspid. There is mild calcification of the  aortic valve. Aortic valve regurgitation is trivial. Aortic valve  sclerosis/calcification is present, without any evidence of aortic  stenosis.   5. The inferior vena cava is normal in size with greater than 50%  respiratory variability, suggesting right atrial pressure of 3 mmHg.       Neuro/Psych       Dementia Neuro/Psych       Dementia 04-06-23 MRI  IMPRESSION:  1. Redemonstrated are multiple predominantly cortical  microhemorrhages bilateral cerebral hemispheres with  a new  microhemorrhage in the right frontal lobe and possibly the right  occipital lobe. These findings are nonspecific but can be seen in  the setting of amyloid angiopathy.  2. Small bilateral extra-axial, likely subdural, fluid collections  measuring to 2 mm, which are new from prior exam. These are  nonspecific and may represent subdural hygromas or chronic subdural  hematomas. Correlate with history of trauma. An additional  differential consideration is CSF hypotension, but there are no  other findings to confirm intracranial hypotension     negative neurological ROS  negative psych ROS   GI/Hepatic Neg liver ROS,GERD  Medicated and Controlled,,  Endo/Other  negative endocrine ROSneg diabetes    Renal/GU Renal diseaseRenal cancer     Musculoskeletal   Abdominal   Peds  Hematology negative hematology ROS (+)   Anesthesia Other Findings Endometriosis  Nephrolithiasis Hyperlipemia Fibrocystic disease of breast Raynaud disease  Thyromegaly Osteopenia  Hypertension Curvature of spine  Back pain Fibroid Kidney stone Osteoporosis  GERD (gastroesophageal reflux disease) Pelvic pain in female  HLD (hyperlipidemia) Cancer (HCC) Melanoma in situ (HCC) Basal cell carcinoma  Dysplastic nevus History of kidney stones Pre-diabetes  Renal cancer, left (HCC) Osteoarthritis B12 deficiency Memory loss PONV (postoperative nausea and vomiting) Mild mitral regurgitation by prior echocardiogram  Grade I diastolic dysfunction Urothelial carcinoma of kidney, left  Transitional cell carcinoma of kidney, left   Previous cataract surgery 05-09-23 Dr. Stephanie Coup  Reproductive/Obstetrics negative OB ROS                              Anesthesia Physical Anesthesia Plan  ASA: 3  Anesthesia Plan: MAC   Post-op Pain Management:    Induction: Intravenous  PONV Risk Score and Plan: 3 and Ondansetron, Midazolam and TIVA  Airway Management  Planned: Natural Airway and Nasal Cannula  Additional Equipment:   Intra-op Plan:   Post-operative Plan:   Informed Consent: I have reviewed the patients History and Physical, chart, labs and discussed the procedure including the risks, benefits and alternatives for the proposed anesthesia with the patient or authorized representative who has indicated his/her understanding and acceptance.     Dental Advisory Given  Plan Discussed with: Anesthesiologist, CRNA and Surgeon  Anesthesia Plan Comments: (Patient consented for risks of anesthesia including but not limited to:  - adverse reactions to medications - damage to eyes, teeth, lips or other oral mucosa - nerve damage due to positioning  - sore throat or hoarseness - Damage to heart, brain, nerves, lungs, other parts of body or loss of life  Patient voiced understanding.)         Anesthesia Quick Evaluation

## 2023-05-14 ENCOUNTER — Ambulatory Visit: Payer: PPO | Admitting: Urology

## 2023-05-14 ENCOUNTER — Other Ambulatory Visit: Payer: Self-pay | Admitting: *Deleted

## 2023-05-14 ENCOUNTER — Encounter: Payer: Self-pay | Admitting: Urology

## 2023-05-14 VITALS — BP 112/71 | HR 71 | Wt 95.0 lb

## 2023-05-14 DIAGNOSIS — N183 Chronic kidney disease, stage 3 unspecified: Secondary | ICD-10-CM

## 2023-05-14 DIAGNOSIS — C642 Malignant neoplasm of left kidney, except renal pelvis: Secondary | ICD-10-CM

## 2023-05-14 NOTE — Progress Notes (Signed)
Jessica Morrison,acting as a scribe for Jessica Scotland, MD.,have documented all relevant documentation on the behalf of Jessica Scotland, MD,as directed by  Jessica Scotland, MD while in the presence of Jessica Scotland, MD.  05/14/2023 2:06 PM   Jessica Morrison 03/06/1946 [id number]  Referring provider: Marguarite Arbour, MD [address] [facility] [place],  [place] [address]  Chief Complaint  Patient presents with   Other    HPI: 77 year-old female who returns today status post left nephroureterectomy on 04/01/2023 which was uncomplicated.   Her Foley catheter was removed a week later. Notably, she has a personal history of a large left upper pole renal mass measuring 3.8 x 2.5 cm.   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 She completed neoadjuvant Gemcitabine-cisplatin although it did have to be dose reduced.   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 The tumor was in greatest diameter 5.1 cm and was invasive. All margins were negative. pT3pNxMx.   Preoperative imaging showed no evidence of metastatic disease.  Her most recent creatinine on 04/02/2023 is 1.87 post-operatively.  Today, she reports slight incisional pain, although it is resolved on exam. She mentions a mild difference in her urination and she describes kind of a "plopping" of her stool. She reports that her urine is clear and it is not spraying. She reports that is not painful or bloody. She is having some memory issues. Her daughter say that she has not mentioned this before.    PMH: Past Medical History:  Diagnosis Date   B12 deficiency    Back pain    Basal cell carcinoma 08/04/2020   0.5cm above the left mid brow, EDC 09/15/20   Cancer (HCC)    Melanoma   Curvature of spine    Dysplastic nevus 04/30/2013   left post shoulder, left parasternal    Dysplastic nevus 05/04/2014   right prox ant deltoid, left lat buttock   Dysplastic nevus 03/26/2019   right lat neck adjacent to the inf top of MM in situ site scar   Dysplastic nevus 10/29/2019   upper back left paraspinal at level of mid scapula   Dysplastic nevus 03/31/2020, 05/12/20 shave removal   Left mid pretibial. Moderate to severe atypia, deep margin involved.   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   right lat neck inf to right angle of mandible, excised: 07/29/2018, margins free   Memory loss    Mild mitral regurgitation by prior echocardiogram    Nephrolithiasis    Osteoarthritis    Osteopenia    Osteoporosis    Pelvic pain in female    PONV (postoperative nausea and vomiting)    Pre-diabetes    Raynaud disease    Renal cancer, left (HCC) 03/2023   Thyromegaly    Transitional cell carcinoma of kidney, left (HCC)    Urothelial carcinoma of kidney, left [facility])     Surgical History: Past Surgical History:  Procedure Laterality Date   ABDOMINAL HYSTERECTOMY  BILATERAL OOPHORECTOMY  1999   BLADDER INSTILLATION N/A 04/01/2023   Procedure: BLADDER INSTILLATION OF GEMCITABINE;  Surgeon: Jessica Scotland, MD;  Location: [facility] ORS;  Service: Urology;  Laterality: N/A;   CATARACT EXTRACTION W/PHACO Right 05/09/2023   Procedure: CATARACT EXTRACTION PHACO AND INTRAOCULAR LENS PLACEMENT (IOC) RIGHT MALYUGIN OMIDRIA 7.79 00:59.0;  Surgeon: Estanislado Pandy, MD;  Location: [facility];  Service: Ophthalmology;  Laterality: Right;   CESAREAN SECTION     COLONOSCOPY  09/02/2020   COLONOSCOPY WITH PROPOFOL N/A 05/09/2015   Procedure:  COLONOSCOPY WITH PROPOFOL;  Surgeon: Elnita Maxwell, MD;  Location: [facility];  Service: Endoscopy;  Laterality: N/A;   CYSTOSCOPY WITH RETROGRADE PYELOGRAM, URETEROSCOPY AND STENT PLACEMENT Left 10/09/2022   Procedure: CYSTOSCOPY WITH RETROGRADE PYELOGRAM, URETEROSCOPY AND STENT PLACEMENT;  Surgeon: Riki Altes, MD;  Location: [facility];  Service: Urology;  Laterality: Left;   IR IMAGING GUIDED PORT INSERTION  12/06/2022   MELANOMA EXCISION Right 07/29/2018   lateral neck inf. to right angle of mandible   OOPHORECTOMY     ROBOT ASSITED LAPAROSCOPIC NEPHROURETERECTOMY Left 04/01/2023   Procedure: XI ROBOT ASSITED LAPAROSCOPIC NEPHROURETERECTOMY;  Surgeon: Jessica Scotland, MD;  Location: [facility];  Service: Urology;  Laterality: Left;   URETERAL BIOPSY Left 10/09/2022   Procedure: URETERAL BIOPSY;  Surgeon: Riki Altes, MD;  Location: [facility];  Service: Urology;  Laterality: Left;    Home Medications:  Allergies as of 05/14/2023       Reactions   Codeine Nausea And Vomiting   Neosporin Wound Cleanser [benzalkonium Chloride] Other (See Comments)   Skin blisters   Penicillins Hives        Medication List        Accurate as of May 14, 2023  2:06 PM. If you have any questions, ask your nurse or doctor.          STOP taking these medications    docusate sodium 100 MG capsule Commonly known as: COLACE Stopped by: Jessica Morrison       TAKE these medications    acetaminophen 325 MG tablet Commonly known as: TYLENOL Take 650 mg by mouth every 6 (six) hours as needed.   calcium-vitamin D 500-200 MG-UNIT tablet Commonly known as: OSCAL WITH D 1 tablet daily with breakfast.   Cholecalciferol 25 MCG (1000 UT) tablet Take 1,000 Units by mouth daily.   cyanocobalamin 1000 MCG tablet Take 1,000 mcg by mouth daily.   donepezil 10 MG tablet Commonly known as: ARICEPT TAKE 1 TABLET BY MOUTH ONCE EVERY EVENING   lidocaine-prilocaine cream Commonly known  as: EMLA Apply to affected area once   memantine 5 MG tablet Commonly known as: NAMENDA Take 5 mg by mouth in the morning and at bedtime.   omeprazole 40 MG capsule Commonly known as: PRILOSEC 40 mg daily.   ondansetron 8 MG tablet Commonly known as: Zofran Take 1 tablet (8 mg total) by mouth every 8 (eight) hours as needed for nausea or vomiting. Start on the third day after cisplatin.   oxybutynin 5 MG tablet Commonly known as: DITROPAN TAKE ONE TABLET (5 MG TOTAL) BY MOUTH EVERY EIGHT HOURS AS NEEDED FOR BLADDER SPASMS.   prochlorperazine 10 MG tablet Commonly known as: COMPAZINE Take 1 tablet (10 mg total) by mouth every 6 (six) hours as needed (Nausea or vomiting).   raloxifene 60 MG tablet Commonly known as: EVISTA TAKE 1 TABLET BY MOUTH ONCE A DAY   rosuvastatin 5 MG tablet Commonly known  as: CRESTOR Take 5 mg by mouth daily.        Allergies:  Allergies  Allergen Reactions   Codeine Nausea And Vomiting   Neosporin Wound Cleanser [Benzalkonium Chloride] Other (See Comments)    Skin blisters   Penicillins Hives    Family History: Family History  Problem Relation Age of Onset   Emphysema Father    Cancer Neg Hx    Heart disease Neg Hx    Diabetes Neg Hx    Breast cancer Neg Hx     Social History:  reports that she has never smoked. She has never been exposed to tobacco smoke. She has never used smokeless tobacco. She reports that she does not drink alcohol and does not use drugs.   Physical Exam: BP 112/71   Pulse 71   Wt 95 lb (43.1 kg)   BMI 20.56 kg/m   Constitutional:  Alert and oriented, No acute distress.  Accompanied by her daughter today.  At times somewhat confused. HEENT: [place] AT, moist mucus membranes.  Trachea midline, no masses. Abdomen: Incisions are well well healed Neurologic: Grossly intact, no focal deficits, moving all 4 extremities. Psychiatric: Normal mood and affect.   Assessment & Plan:    1. High grade, invasive, left  upper tract urothelial carcinoma - Status post neoadjuvant Gemcitabine-cisplatin -Surgical pathology reviewed, persistent high-grade invasive upper tract TCC with CIS, all margins negative - She is scheduled for CT abdomen pelvis with contrast on 07/31/2023 with Dr. Orlie Dakin. We will ask Dr. Orlie Dakin to change his CT scan to CT urogram in order to fully evaluate her right kidney in the form of upper tract imaging - Plan for cystoscopy next month for bladder surveillance, likely continues on a every 3 month basis for several years and then biannually -She does have some nonspecific complaints today, her daughter will try to help elucidate which she needs specifically ? GI related issues  2. Stage 3 CKD - Solitary kidney precautions    Return in about 1 month (around 06/14/2023) for cystoscopy.  I have reviewed the above documentation for accuracy and completeness, and I agree with the above.   Jessica Scotland, MD    [facility] [address], [address] [place], [place] [address] [phone]

## 2023-05-15 ENCOUNTER — Other Ambulatory Visit: Payer: Self-pay

## 2023-05-15 ENCOUNTER — Other Ambulatory Visit: Payer: Self-pay | Admitting: Oncology

## 2023-05-15 DIAGNOSIS — C689 Malignant neoplasm of urinary organ, unspecified: Secondary | ICD-10-CM

## 2023-05-16 DIAGNOSIS — I7300 Raynaud's syndrome without gangrene: Secondary | ICD-10-CM | POA: Diagnosis not present

## 2023-05-16 DIAGNOSIS — K648 Other hemorrhoids: Secondary | ICD-10-CM | POA: Diagnosis not present

## 2023-05-16 DIAGNOSIS — K5730 Diverticulosis of large intestine without perforation or abscess without bleeding: Secondary | ICD-10-CM | POA: Diagnosis not present

## 2023-05-16 DIAGNOSIS — M545 Low back pain, unspecified: Secondary | ICD-10-CM | POA: Diagnosis not present

## 2023-05-16 DIAGNOSIS — I10 Essential (primary) hypertension: Secondary | ICD-10-CM | POA: Diagnosis not present

## 2023-05-16 DIAGNOSIS — C679 Malignant neoplasm of bladder, unspecified: Secondary | ICD-10-CM | POA: Diagnosis not present

## 2023-05-16 DIAGNOSIS — M810 Age-related osteoporosis without current pathological fracture: Secondary | ICD-10-CM | POA: Diagnosis not present

## 2023-05-16 DIAGNOSIS — Z905 Acquired absence of kidney: Secondary | ICD-10-CM | POA: Diagnosis not present

## 2023-05-16 DIAGNOSIS — E785 Hyperlipidemia, unspecified: Secondary | ICD-10-CM | POA: Diagnosis not present

## 2023-05-16 DIAGNOSIS — N809 Endometriosis, unspecified: Secondary | ICD-10-CM | POA: Diagnosis not present

## 2023-05-16 DIAGNOSIS — K219 Gastro-esophageal reflux disease without esophagitis: Secondary | ICD-10-CM | POA: Diagnosis not present

## 2023-05-16 DIAGNOSIS — F32A Depression, unspecified: Secondary | ICD-10-CM | POA: Diagnosis not present

## 2023-05-16 DIAGNOSIS — E538 Deficiency of other specified B group vitamins: Secondary | ICD-10-CM | POA: Diagnosis not present

## 2023-05-16 DIAGNOSIS — Z87442 Personal history of urinary calculi: Secondary | ICD-10-CM | POA: Diagnosis not present

## 2023-05-16 DIAGNOSIS — D126 Benign neoplasm of colon, unspecified: Secondary | ICD-10-CM | POA: Diagnosis not present

## 2023-05-16 DIAGNOSIS — F0393 Unspecified dementia, unspecified severity, with mood disturbance: Secondary | ICD-10-CM | POA: Diagnosis not present

## 2023-05-16 DIAGNOSIS — E010 Iodine-deficiency related diffuse (endemic) goiter: Secondary | ICD-10-CM | POA: Diagnosis not present

## 2023-05-16 DIAGNOSIS — Z9181 History of falling: Secondary | ICD-10-CM | POA: Diagnosis not present

## 2023-05-21 NOTE — Discharge Instructions (Signed)

## 2023-05-23 ENCOUNTER — Other Ambulatory Visit: Payer: Self-pay

## 2023-05-23 ENCOUNTER — Ambulatory Visit: Payer: PPO | Admitting: Urgent Care

## 2023-05-23 ENCOUNTER — Encounter
Admission: RE | Disposition: A | Discharge status: Home / Self Care | Payer: Self-pay | Source: Home / Self Care | Attending: Ophthalmology

## 2023-05-23 ENCOUNTER — Encounter: Payer: Self-pay | Admitting: Ophthalmology

## 2023-05-23 ENCOUNTER — Ambulatory Visit
Admission: RE | Admit: 2023-05-23 | Discharge: 2023-05-23 | Disposition: A | Discharge status: Home / Self Care | Payer: PPO | Attending: Ophthalmology | Admitting: Ophthalmology

## 2023-05-23 DIAGNOSIS — F0390 Unspecified dementia without behavioral disturbance: Secondary | ICD-10-CM | POA: Insufficient documentation

## 2023-05-23 DIAGNOSIS — I10 Essential (primary) hypertension: Secondary | ICD-10-CM | POA: Insufficient documentation

## 2023-05-23 DIAGNOSIS — K219 Gastro-esophageal reflux disease without esophagitis: Secondary | ICD-10-CM | POA: Diagnosis not present

## 2023-05-23 DIAGNOSIS — H2512 Age-related nuclear cataract, left eye: Secondary | ICD-10-CM | POA: Diagnosis not present

## 2023-05-23 HISTORY — PX: CATARACT EXTRACTION W/PHACO: SHX586

## 2023-05-23 SURGERY — PHACOEMULSIFICATION, CATARACT, WITH IOL INSERTION
Anesthesia: Monitor Anesthesia Care | Laterality: Left

## 2023-05-23 MED ORDER — TETRACAINE HCL 0.5 % OP SOLN
1.0000 [drp] | OPHTHALMIC | Status: DC | PRN
  Administered 2023-05-23 (×3): 1 [drp] via OPHTHALMIC

## 2023-05-23 MED ORDER — LIDOCAINE HCL (PF) 2 % IJ SOLN
INTRAOCULAR | Status: DC | PRN
  Administered 2023-05-23: 1 mL via INTRAOCULAR

## 2023-05-23 MED ORDER — ARMC OPHTHALMIC DILATING DROPS
1.0000 | OPHTHALMIC | Status: DC | PRN
  Administered 2023-05-23 (×3): 1 via OPHTHALMIC

## 2023-05-23 MED ORDER — FENTANYL CITRATE (PF) 100 MCG/2ML IJ SOLN
INTRAMUSCULAR | Status: DC | PRN
  Administered 2023-05-23: 50 ug via INTRAVENOUS

## 2023-05-23 MED ORDER — BRIMONIDINE TARTRATE-TIMOLOL 0.2-0.5 % OP SOLN
OPHTHALMIC | Status: DC | PRN
  Administered 2023-05-23: 1 [drp] via OPHTHALMIC

## 2023-05-23 MED ORDER — SODIUM CHLORIDE 0.9% FLUSH
INTRAVENOUS | Status: DC | PRN
  Administered 2023-05-23: 10 mL via INTRAVENOUS

## 2023-05-23 MED ORDER — SIGHTPATH DOSE#1 NA HYALUR & NA CHOND-NA HYALUR IO KIT
PACK | INTRAOCULAR | Status: DC | PRN
  Administered 2023-05-23: 1 via OPHTHALMIC

## 2023-05-23 MED ORDER — PHENYLEPHRINE-KETOROLAC 1-0.3 % IO SOLN
INTRAOCULAR | Status: DC | PRN
  Administered 2023-05-23: 107 mL via OPHTHALMIC

## 2023-05-23 MED ORDER — MOXIFLOXACIN HCL 0.5 % OP SOLN
OPHTHALMIC | Status: DC | PRN
  Administered 2023-05-23: .2 mL via OPHTHALMIC

## 2023-05-23 MED ORDER — MIDAZOLAM HCL 2 MG/2ML IJ SOLN
INTRAMUSCULAR | Status: DC | PRN
  Administered 2023-05-23: 1 mg via INTRAVENOUS

## 2023-05-23 MED ORDER — SIGHTPATH DOSE#1 BSS IO SOLN
INTRAOCULAR | Status: DC | PRN
  Administered 2023-05-23: 15 mL via INTRAOCULAR

## 2023-05-23 SURGICAL SUPPLY — 9 items
CATARACT SUITE SIGHTPATH (MISCELLANEOUS) ×1
DISSECTOR HYDRO NUCLEUS 50X22 (MISCELLANEOUS) ×1 IMPLANT
DRSG TEGADERM 2-3/8X2-3/4 SM (GAUZE/BANDAGES/DRESSINGS) ×1 IMPLANT
FEE CATARACT SUITE SIGHTPATH (MISCELLANEOUS) ×1 IMPLANT
GLOVE SURG SYN 7.5 E (GLOVE) ×1
GLOVE SURG SYN 7.5 PF PI (GLOVE) ×1 IMPLANT
GLOVE SURG SYN 8.5 E (GLOVE) ×1
GLOVE SURG SYN 8.5 PF PI (GLOVE) ×1 IMPLANT
LENS IOL TECNIS EYHANCE 24.0 (Intraocular Lens) IMPLANT

## 2023-05-23 NOTE — Transfer of Care (Addendum)
Immediate Anesthesia Transfer of Care Note  Patient: Jessica Morrison  Procedure(s) Performed: CATARACT EXTRACTION PHACO AND INTRAOCULAR LENS PLACEMENT (IOC) LEFT MALYUGIN OMIDRIA 12.13 1:23.6 (Left)  Patient Location: PACU  Anesthesia Type:MAC  Level of Consciousness: awake and alert   Airway & Oxygen Therapy: Patient Spontanous Breathing  Post-op Assessment: Report given to RN and Post -op Vital signs reviewed and stable  Post vital signs: Reviewed and stable  Last Vitals: All V/S reviewed by CRNA and wnl.   See PACU flow sheet.  Vitals Value Taken Time  BP    Temp 35.7 C 05/23/23 1308  Pulse 68 05/23/23 1310  Resp 19 05/23/23 1310  SpO2 98 % 05/23/23 1310    Last Pain:  Vitals:   05/23/23 1127  TempSrc: Temporal  PainSc: 0-No pain         Complications: No notable events documented.

## 2023-05-23 NOTE — Anesthesia Postprocedure Evaluation (Signed)
Anesthesia Post Note  Patient: Jessica Morrison  Procedure(s) Performed: CATARACT EXTRACTION PHACO AND INTRAOCULAR LENS PLACEMENT (IOC) LEFT MALYUGIN OMIDRIA 12.13 1:23.6 (Left)  Patient location during evaluation: PACU Anesthesia Type: MAC Level of consciousness: awake and alert Pain management: pain level controlled Vital Signs Assessment: post-procedure vital signs reviewed and stable Respiratory status: spontaneous breathing, nonlabored ventilation, respiratory function stable and patient connected to nasal cannula oxygen Cardiovascular status: stable and blood pressure returned to baseline Postop Assessment: no apparent nausea or vomiting Anesthetic complications: no   No notable events documented.   Last Vitals:  Vitals:   05/23/23 1310 05/23/23 1315  BP: (!) 121/59 (!) 127/54  Pulse: 68 67  Resp: 19 15  Temp:    SpO2: 98% 99%    Last Pain:  Vitals:   05/23/23 1315  TempSrc:   PainSc: 0-No pain                 Louie Boston

## 2023-05-23 NOTE — Op Note (Signed)
OPERATIVE NOTE  Jessica Morrison [id number] 05/23/2023   PREOPERATIVE DIAGNOSIS: Nuclear sclerotic cataract left eye. H25.12   POSTOPERATIVE DIAGNOSIS: Nuclear sclerotic cataract left eye. H25.12   PROCEDURE:  Phacoemusification with posterior chamber intraocular lens placement of the left eye  Ultrasound time: Procedure(s): CATARACT EXTRACTION PHACO AND INTRAOCULAR LENS PLACEMENT (IOC) LEFT MALYUGIN OMIDRIA 12.13 1:23.6 (Left)  LENS:   Implant Name Type Inv. Item Serial No. Manufacturer Lot No. LRB No. Used Action  LENS IOL TECNIS EYHANCE 24.0 - [id number] Intraocular Lens LENS IOL TECNIS EYHANCE 24.0 [id number] [employer]  Left 1 Implanted      SURGEON:  Julious Payer. Rolley Sims, MD   ANESTHESIA:  Topical with tetracaine drops, augmented with 1% preservative-free intracameral lidocaine.   COMPLICATIONS:  None.   DESCRIPTION OF PROCEDURE:  The patient was identified in the holding room and transported to the operating room and placed in the supine position under the operating microscope.  The left eye was identified as the operative eye, which was prepped and draped in the usual sterile ophthalmic fashion.   A 1 millimeter clear-corneal paracentesis was made inferotemporally. Preservative-free 1% lidocaine mixed with 1:1,000 bisulfite-free aqueous solution of epinephrine was injected into the anterior chamber. The anterior chamber was then filled with Viscoat viscoelastic. A 2.4 millimeter keratome was used to make a clear-corneal incision superotemporally. A curvilinear capsulorrhexis was made with a cystotome and capsulorrhexis forceps. Balanced salt solution was used to hydrodissect and hydrodelineate the nucleus. Phacoemulsification was then used to remove the lens nucleus and epinucleus. The remaining cortex was then removed using the irrigation and aspiration handpiece. Provisc was then placed into the capsular bag to distend it for lens placement. A +24.00 D DIB00 intraocular lens was then  injected into the capsular bag. The remaining viscoelastic was aspirated.   Wounds were hydrated with balanced salt solution.  The anterior chamber was inflated to a physiologic pressure with balanced salt solution.  No wound leaks were noted. Vigamox was injected intracamerally.  Timolol and Brimonidine drops were applied to the eye.  The patient was taken to the recovery room in stable condition without complications of anesthesia or surgery.  [name] [place] 05/23/2023, 1:05 PM

## 2023-05-23 NOTE — H&P (Signed)
[facility]   Primary Care Physician:  Marguarite Arbour, MD Ophthalmologist: Dr. Deberah Pelton  Pre-Procedure History & Physical: HPI:  Jessica Morrison is a 77 y.o. female here for cataract surgery.   Past Medical History:  Diagnosis Date   B12 deficiency    Back pain    Basal cell carcinoma 08/04/2020   0.5cm above the left mid brow, EDC 09/15/20   Cancer (HCC)    Melanoma   Curvature of spine    Dysplastic nevus 04/30/2013   left post shoulder, left parasternal   Dysplastic nevus 05/04/2014   right prox ant deltoid, left lat buttock   Dysplastic nevus 03/26/2019   right lat neck adjacent to the inf top of MM in situ site scar   Dysplastic nevus 10/29/2019   upper back left paraspinal at level of mid scapula   Dysplastic nevus 03/31/2020, 05/12/20 shave removal   Left mid pretibial. Moderate to severe atypia, deep margin involved.   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   right lat neck inf to right angle of mandible, excised: 07/29/2018, margins free   Memory loss    Mild mitral regurgitation by prior echocardiogram    Nephrolithiasis    Osteoarthritis    Osteopenia    Osteoporosis    Pelvic pain in female    PONV (postoperative nausea and vomiting)    Pre-diabetes    Raynaud disease    Renal cancer, left (HCC) 03/2023   Thyromegaly    Transitional cell carcinoma of kidney, left (HCC)    Urothelial carcinoma of kidney, left (HCC)     Past Surgical History:  Procedure Laterality Date   ABDOMINAL HYSTERECTOMY     BILATERAL OOPHORECTOMY  1999   BLADDER INSTILLATION N/A 04/01/2023   Procedure: BLADDER INSTILLATION OF GEMCITABINE;   Surgeon: Vanna Scotland, MD;  Location: [facility] ORS;  Service: Urology;  Laterality: N/A;   CATARACT EXTRACTION W/PHACO Right 05/09/2023   Procedure: CATARACT EXTRACTION PHACO AND INTRAOCULAR LENS PLACEMENT (IOC) RIGHT MALYUGIN OMIDRIA 7.79 00:59.0;  Surgeon: Estanislado Pandy, MD;  Location: [facility];  Service: Ophthalmology;  Laterality: Right;   CESAREAN SECTION     COLONOSCOPY  09/02/2020   COLONOSCOPY WITH PROPOFOL N/A 05/09/2015   Procedure: COLONOSCOPY WITH PROPOFOL;  Surgeon: Elnita Maxwell, MD;  Location: [facility] ENDOSCOPY;  Service: Endoscopy;  Laterality: N/A;   CYSTOSCOPY WITH RETROGRADE PYELOGRAM, URETEROSCOPY AND STENT PLACEMENT Left 10/09/2022   Procedure: CYSTOSCOPY WITH RETROGRADE PYELOGRAM, URETEROSCOPY AND STENT PLACEMENT;  Surgeon: Riki Altes, MD;  Location: [facility] ORS;  Service: Urology;  Laterality: Left;   IR IMAGING GUIDED PORT INSERTION  12/06/2022   MELANOMA EXCISION Right 07/29/2018   lateral neck inf. to right angle of mandible   OOPHORECTOMY     ROBOT ASSITED LAPAROSCOPIC NEPHROURETERECTOMY Left 04/01/2023   Procedure: XI ROBOT ASSITED LAPAROSCOPIC NEPHROURETERECTOMY;  Surgeon: Vanna Scotland, MD;  Location: [facility] ORS;  Service: Urology;  Laterality: Left;   URETERAL BIOPSY Left 10/09/2022   Procedure: URETERAL BIOPSY;  Surgeon: Riki Altes, MD;  Location: [facility] ORS;  Service: Urology;  Laterality:  Left;    Prior to Admission medications   Medication Sig Start Date End Date Taking? Authorizing Provider  acetaminophen (TYLENOL) 325 MG tablet Take 650 mg by mouth every 6 (six) hours as needed.    [provider]  calcium-vitamin D (OSCAL WITH D) 500-200 MG-UNIT tablet 1 tablet daily with breakfast.    [provider]  Cholecalciferol 25 MCG (1000 UT) tablet Take 1,000 Units by mouth daily.    [provider]  cyanocobalamin 1000 MCG tablet Take 1,000 mcg by mouth daily.    [provider]  donepezil  (ARICEPT) 10 MG tablet TAKE 1 TABLET BY MOUTH ONCE EVERY EVENING 08/06/21   [provider]  lidocaine-prilocaine (EMLA) cream Apply to affected area once 12/01/22   Jeralyn Ruths, MD  memantine (NAMENDA) 5 MG tablet Take 5 mg by mouth in the morning and at bedtime. 05/10/21   [provider]  omeprazole (PRILOSEC) 40 MG capsule 40 mg daily. 10/29/19   [provider]  ondansetron (ZOFRAN) 8 MG tablet Take 1 tablet (8 mg total) by mouth every 8 (eight) hours as needed for nausea or vomiting. Start on the third day after cisplatin. 12/01/22   Jeralyn Ruths, MD  oxybutynin (DITROPAN) 5 MG tablet TAKE ONE TABLET (5 MG TOTAL) BY MOUTH EVERY EIGHT HOURS AS NEEDED FOR BLADDER SPASMS. 04/17/23   Vaillancourt, Lelon Mast, PA-C  prochlorperazine (COMPAZINE) 10 MG tablet Take 1 tablet (10 mg total) by mouth every 6 (six) hours as needed (Nausea or vomiting). 12/01/22   Jeralyn Ruths, MD  raloxifene (EVISTA) 60 MG tablet TAKE 1 TABLET BY MOUTH ONCE A DAY 11/29/22   Linzie Collin, MD  rosuvastatin (CRESTOR) 5 MG tablet Take 5 mg by mouth daily.    [provider]    Allergies as of 03/04/2023 - Review Complete 02/28/2023  Allergen Reaction Noted   Codeine Nausea And Vomiting 05/06/2015   Neosporin wound cleanser [benzalkonium chloride] Other (See Comments) 05/06/2015   Penicillins  05/06/2015    Family History  Problem Relation Age of Onset   Emphysema Father    Cancer Neg Hx    Heart disease Neg Hx    Diabetes Neg Hx    Breast cancer Neg Hx     Social History   Socioeconomic History   Marital status: Widowed    Spouse name: Not on file   Number of children: Not on file   Years of education: Not on file   Highest education level: Not on file  Occupational History   Not on file  Tobacco Use   Smoking status: Never    Passive exposure: Never   Smokeless tobacco: Never  Vaping Use   Vaping status: Never Used  Substance and Sexual Activity    Alcohol use: No   Drug use: No   Sexual activity: Not Currently    Birth control/protection: Surgical  Other Topics Concern   Not on file  Social History Narrative   Not on file   Social Determinants of Health   Financial Resource Strain: Low Risk  (02/04/2023)   Received from [facility]   Overall Financial Resource Strain (CARDIA)    Difficulty of Paying Living Expenses: Not hard at all  Food Insecurity: No Food Insecurity (04/01/2023)   Hunger Vital Sign    Worried About Running Out of Food in the Last Year: Never true    Ran Out of Food in the Last Year:  Never true  Transportation Needs: No Transportation Needs (04/01/2023)   PRAPARE - Administrator, Civil Service (Medical): No    Lack of Transportation (Non-Medical): No  Physical Activity: Insufficiently Active (08/27/2017)   Exercise Vital Sign    Days of Exercise per Week: 2 days    Minutes of Exercise per Session: 60 min  Stress: Stress Concern Present (08/27/2017)   [employer] of Occupational Health - Occupational Stress Questionnaire    Feeling of Stress : To some extent  Social Connections: Not on file  Intimate Partner Violence: Not At Risk (04/01/2023)   Humiliation, Afraid, Rape, and Kick questionnaire    Fear of Current or Ex-Partner: No    Emotionally Abused: No    Physically Abused: No    Sexually Abused: No    Review of Systems: See HPI, otherwise negative ROS  Physical Exam: There were no vitals taken for this visit. General:   Alert, cooperative in NAD Head:  Normocephalic and atraumatic. Respiratory:  Normal work of breathing. Cardiovascular:  RRR  Impression/Plan: Jessica Morrison is here for cataract surgery.  Risks, benefits, limitations, and alternatives regarding cataract surgery have been reviewed with the patient.  Questions have been answered.  All parties agreeable.   Estanislado Pandy, MD  05/23/2023, 11:04  AM

## 2023-05-31 ENCOUNTER — Ambulatory Visit: Payer: PPO | Admitting: Internal Medicine

## 2023-06-12 ENCOUNTER — Ambulatory Visit: Payer: PPO | Admitting: Urology

## 2023-06-14 ENCOUNTER — Other Ambulatory Visit: Payer: Self-pay | Admitting: Obstetrics and Gynecology

## 2023-06-14 DIAGNOSIS — Z01419 Encounter for gynecological examination (general) (routine) without abnormal findings: Secondary | ICD-10-CM

## 2023-06-14 DIAGNOSIS — N390 Urinary tract infection, site not specified: Secondary | ICD-10-CM

## 2023-06-18 ENCOUNTER — Encounter: Payer: Self-pay | Admitting: Urology

## 2023-06-18 ENCOUNTER — Ambulatory Visit: Payer: PPO | Admitting: Urology

## 2023-06-18 VITALS — BP 127/69 | HR 72 | Ht <= 58 in | Wt 95.0 lb

## 2023-06-18 DIAGNOSIS — R310 Gross hematuria: Secondary | ICD-10-CM | POA: Diagnosis not present

## 2023-06-18 DIAGNOSIS — D494 Neoplasm of unspecified behavior of bladder: Secondary | ICD-10-CM | POA: Diagnosis not present

## 2023-06-18 DIAGNOSIS — Z85528 Personal history of other malignant neoplasm of kidney: Secondary | ICD-10-CM | POA: Diagnosis not present

## 2023-06-18 DIAGNOSIS — C678 Malignant neoplasm of overlapping sites of bladder: Secondary | ICD-10-CM

## 2023-06-18 DIAGNOSIS — C642 Malignant neoplasm of left kidney, except renal pelvis: Secondary | ICD-10-CM | POA: Diagnosis not present

## 2023-06-18 DIAGNOSIS — Z01818 Encounter for other preprocedural examination: Secondary | ICD-10-CM

## 2023-06-18 LAB — URINALYSIS, COMPLETE
Bilirubin, UA: NEGATIVE
Glucose, UA: NEGATIVE
Ketones, UA: NEGATIVE
Leukocytes,UA: NEGATIVE
Nitrite, UA: NEGATIVE
Protein,UA: NEGATIVE
Specific Gravity, UA: 1.025 (ref 1.005–1.030)
Urobilinogen, Ur: 0.2 mg/dL (ref 0.2–1.0)
pH, UA: 5 (ref 5.0–7.5)

## 2023-06-18 LAB — MICROSCOPIC EXAMINATION

## 2023-06-18 NOTE — H&P (View-Only) (Signed)
06/18/23  CC:  Chief Complaint  Patient presents with   Cysto    HPI: 77 y.o. female who returns today status post left nephroureterectomy on 04/01/2023 which was uncomplicated.    Her Foley catheter was removed a week later. Notably, she has a personal history of a large left upper pole renal mass measuring 3.8 x 2.5 cm.    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 She completed neoadjuvant Gemcitabine-cisplatin although it did have to be dose reduced.    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 The tumor was in greatest diameter 5.1 cm and was invasive. All margins were negative. pT3pNxMx.    Preoperative imaging showed no evidence of metastatic disease.    CT hematuria scheduled for 07/31/23.  Followed by Dr. Orlie Dakin.     Her most recent creatinine on 04/02/2023 is 1.87 post-operatively.   Blood pressure 127/69, pulse 72, height 4\' 9"  (1.448 m), weight 95 lb (43.1 kg). NED. A&Ox3.   No respiratory distress   Abd soft, NT, ND  Cystoscopy Procedure Note  Patient identification was confirmed, informed consent was obtained, and patient was prepped using Betadine solution.  Lidocaine jelly was administered per urethral meatus.     Pre-Procedure: - Inspection reveals a normal caliber ureteral meatus.  Procedure: The flexible cystoscope was introduced without difficulty - No urethral strictures/lesions are present. -Multiple spherical tumors in the bladder, somewhat of a superficial appearance including the left bladder neck, left hemitrigone, posterior bladder wall, largest of which measures about 1.5 cm   Retroflexion shows tumors on bladder neck   Post-Procedure: - Patient tolerated the procedure well  Assessment/ Plan:  1. Malignant neoplasm of overlapping sites of bladder [facility]) Multiple recurrent tumors involving the  bladder, primarily on the left side, likely representing satellite lesions  Recommend proceeding to the operating room for TURBT, right retrograde pyelogram and intravesical gemcitabine.  Risks of surgery were discussed in detail including risk of bleeding, infection, damage surrounding structures, amongst others.  All questions were answered.  Preoperative urine sent today.  Patient's daughter was also present today aware of the findings and plan for surgery.  All questions were answered. - Urinalysis, Complete  2. Urothelial carcinoma of kidney, left (HCC) As above, due for CT scan in October for staging - Urinalysis, Complete  3. Preop testing - CULTURE, URINE COMPREHENSIVE   Vanna Scotland, MD

## 2023-06-18 NOTE — Progress Notes (Signed)
   06/18/23  CC:  Chief Complaint  Patient presents with   Cysto    HPI: 77 y.o. female who returns today status post left nephroureterectomy on 04/01/2023 which was uncomplicated.    Her Foley catheter was removed a week later. Notably, she has a personal history of a large left upper pole renal mass measuring 3.8 x 2.5 cm.    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 She completed neoadjuvant Gemcitabine-cisplatin although it did have to be dose reduced.    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 The tumor was in greatest diameter 5.1 cm and was invasive. All margins were negative. pT3pNxMx.    Preoperative imaging showed no evidence of metastatic disease.    CT hematuria scheduled for 07/31/23.  Followed by Dr. Orlie Dakin.     Her most recent creatinine on 04/02/2023 is 1.87 post-operatively.   Blood pressure 127/69, pulse 72, height 4\' 9"  (1.448 m), weight 95 lb (43.1 kg). NED. A&Ox3.   No respiratory distress   Abd soft, NT, ND  Cystoscopy Procedure Note  Patient identification was confirmed, informed consent was obtained, and patient was prepped using Betadine solution.  Lidocaine jelly was administered per urethral meatus.     Pre-Procedure: - Inspection reveals a normal caliber ureteral meatus.  Procedure: The flexible cystoscope was introduced without difficulty - No urethral strictures/lesions are present. -Multiple spherical tumors in the bladder, somewhat of a superficial appearance including the left bladder neck, left hemitrigone, posterior bladder wall, largest of which measures about 1.5 cm   Retroflexion shows tumors on bladder neck   Post-Procedure: - Patient tolerated the procedure well  Assessment/ Plan:  1. Malignant neoplasm of overlapping sites of bladder [facility]) Multiple recurrent tumors involving the  bladder, primarily on the left side, likely representing satellite lesions  Recommend proceeding to the operating room for TURBT, right retrograde pyelogram and intravesical gemcitabine.  Risks of surgery were discussed in detail including risk of bleeding, infection, damage surrounding structures, amongst others.  All questions were answered.  Preoperative urine sent today.  Patient's daughter was also present today aware of the findings and plan for surgery.  All questions were answered. - Urinalysis, Complete  2. Urothelial carcinoma of kidney, left (HCC) As above, due for CT scan in October for staging - Urinalysis, Complete  3. Preop testing - CULTURE, URINE COMPREHENSIVE   Vanna Scotland, MD

## 2023-06-19 ENCOUNTER — Inpatient Hospital Stay: Payer: PPO | Attending: Oncology

## 2023-06-19 ENCOUNTER — Other Ambulatory Visit: Payer: Self-pay

## 2023-06-19 DIAGNOSIS — Z95828 Presence of other vascular implants and grafts: Secondary | ICD-10-CM

## 2023-06-19 DIAGNOSIS — Z85528 Personal history of other malignant neoplasm of kidney: Secondary | ICD-10-CM | POA: Diagnosis not present

## 2023-06-19 DIAGNOSIS — D494 Neoplasm of unspecified behavior of bladder: Secondary | ICD-10-CM

## 2023-06-19 DIAGNOSIS — Z452 Encounter for adjustment and management of vascular access device: Secondary | ICD-10-CM | POA: Insufficient documentation

## 2023-06-19 MED ORDER — SODIUM CHLORIDE 0.9% FLUSH
10.0000 mL | Freq: Once | INTRAVENOUS | Status: AC
  Administered 2023-06-19: 10 mL via INTRAVENOUS
  Filled 2023-06-19: qty 10

## 2023-06-19 MED ORDER — HEPARIN SOD (PORK) LOCK FLUSH 100 UNIT/ML IV SOLN
500.0000 [IU] | Freq: Once | INTRAVENOUS | Status: AC
  Administered 2023-06-19: 500 [IU] via INTRAVENOUS
  Filled 2023-06-19: qty 5

## 2023-06-19 NOTE — Progress Notes (Signed)
Surgical Physician Order Form  [facility] [place]  Dr. Vanna Scotland, MD  * Scheduling expectation : Next Available  *Length of Case:   *Clearance needed: no  *Anticoagulation Instructions: Hold all anticoagulants  *Aspirin Instructions: Hold Aspirin  *Post-op visit Date/Instructions:  3 mo cysto  *Diagnosis: Bladder Tumor  *Procedure: right retrograde pyelogram, TURBT <2cm (16109), instillation of intravesical gemcitabine   Additional orders: Gemcitabine 2000mg  bladder instillation  -Admit type: OUTpatient  -Anesthesia: General  -VTE Prophylaxis Standing Order SCD's       Other:   -Standing Lab Orders Per Anesthesia    Lab other: None  -Standing Test orders EKG/Chest x-ray per Anesthesia       Test other:   - Medications:  Ancef 2gm IV  -Other orders:  N/A

## 2023-06-20 ENCOUNTER — Encounter: Payer: Self-pay | Admitting: Urology

## 2023-06-21 ENCOUNTER — Other Ambulatory Visit: Payer: Self-pay

## 2023-06-21 ENCOUNTER — Telehealth: Payer: Self-pay

## 2023-06-21 NOTE — Telephone Encounter (Signed)
Per Dr. Apolinar Junes, Patient is to be scheduled for Transurethral Resection of Bladder Tumor with Intravesical Instillation of Gemcitabine and Right Retrograde Pyelogram   Mrs. Coverdale and her daughter was contacted and possible surgical dates were discussed, Monday September 16th, 2024 was agreed upon for surgery.   Patient was directed to call [phone] between 1-3pm the day before surgery to find out surgical arrival time.  Instructions were given not to eat or drink from midnight on the night before surgery and have a driver for the day of surgery. On the surgery day patient was instructed to enter through the Medical Mall entrance of [facility] report the Same Day Surgery desk.   Pre-Admit Testing will be in contact via phone to set up an interview with the anesthesia team to review your history and medications prior to surgery.   Reminder of this information was sent via MyChart to the patient.

## 2023-06-21 NOTE — Progress Notes (Signed)
   [facility] Surgical Posting Form  Surgery Date: Date: 07/01/2023  Surgeon: Dr. Vanna Scotland, MD  Inpt ( No  )   Outpt (Yes)   Obs ( No  )   Diagnosis: D49.4 Bladder Tumor  -CPT: 29528, 51720, [phone]  Surgery: Transurethral Resection of Bladder Tumor with Intravesical Instillation of Gemcitabine and Right Retrograde Pyelogram  Stop Anticoagulations: Yes and also hold ASA  Cardiac/Medical/Pulmonary Clearance needed: no  *Orders entered into EPIC  Date: 06/21/23   *Case booked in [place]  Date: 06/20/2023  *Notified pt of Surgery: Date: 06/20/2023  PRE-OP UA & CX: no  *Placed into Prior Authorization Work Que Date: 06/21/23  Assistant/laser/rep:No

## 2023-06-25 LAB — CULTURE, URINE COMPREHENSIVE

## 2023-06-26 ENCOUNTER — Other Ambulatory Visit: Payer: Self-pay

## 2023-06-26 ENCOUNTER — Encounter
Admission: RE | Admit: 2023-06-26 | Discharge: 2023-06-26 | Disposition: A | Discharge status: Home / Self Care | Payer: PPO | Source: Ambulatory Visit | Attending: Urology | Admitting: Urology

## 2023-06-26 DIAGNOSIS — E7800 Pure hypercholesterolemia, unspecified: Secondary | ICD-10-CM | POA: Diagnosis not present

## 2023-06-26 DIAGNOSIS — Z79899 Other long term (current) drug therapy: Secondary | ICD-10-CM | POA: Diagnosis not present

## 2023-06-26 DIAGNOSIS — R7303 Prediabetes: Secondary | ICD-10-CM | POA: Diagnosis not present

## 2023-06-26 DIAGNOSIS — E538 Deficiency of other specified B group vitamins: Secondary | ICD-10-CM | POA: Diagnosis not present

## 2023-06-26 DIAGNOSIS — R413 Other amnesia: Secondary | ICD-10-CM | POA: Diagnosis not present

## 2023-06-26 DIAGNOSIS — I10 Essential (primary) hypertension: Secondary | ICD-10-CM | POA: Diagnosis not present

## 2023-06-26 DIAGNOSIS — C649 Malignant neoplasm of unspecified kidney, except renal pelvis: Secondary | ICD-10-CM | POA: Diagnosis not present

## 2023-06-26 NOTE — Patient Instructions (Addendum)
Your procedure is scheduled on: 07/01/23 - Monday Report to the Registration Desk on the 1st floor of the Medical Mall. To find out your arrival time, please call [phone] between 1PM - 3PM on: 06/28/23 - Friday If your arrival time is 6:00 am, do not arrive before that time as the Medical Mall entrance doors do not open until 6:00 am.  REMEMBER: Instructions that are not followed completely may result in serious medical risk, up to and including death; or upon the discretion of your surgeon and anesthesiologist your surgery may need to be rescheduled.  Do not eat food or drink any liquids after midnight the night before surgery.  No gum chewing or hard candies.   One week prior to surgery: Stop Anti-inflammatories (NSAIDS) such as Advil, Aleve, Ibuprofen, Motrin, Naproxen, Naprosyn and Aspirin based products such as Excedrin, Goody's Powder, BC Powder. You may however, continue to take Tylenol if needed for pain up until the day of surgery.  Stop ANY OVER THE COUNTER supplements until after surgery.   TAKE ONLY THESE MEDICATIONS THE MORNING OF SURGERY WITH A SIP OF WATER:  omeprazole (PRILOSEC) - (take one the night before and one on the morning of surgery - helps to prevent nausea after surgery.) memantine (NAMENDA)  raloxifene (EVISTA)  rosuvastatin (CRESTOR)    No Alcohol for 24 hours before or after surgery.  No Smoking including e-cigarettes for 24 hours before surgery.  No chewable tobacco products for at least 6 hours before surgery.  No nicotine patches on the day of surgery.  Do not use any "recreational" drugs for at least a week (preferably 2 weeks) before your surgery.  Please be advised that the combination of cocaine and anesthesia may have negative outcomes, up to and including death. If you test positive for cocaine, your surgery will be cancelled.  On the morning of surgery brush your teeth with toothpaste and water, you may rinse your mouth with mouthwash  if you wish. Do not swallow any toothpaste or mouthwash.  Do not wear jewelry, make-up, hairpins, clips or nail polish.  For welded (permanent) jewelry: bracelets, anklets, waist bands, etc.  Please have this removed prior to surgery.  If it is not removed, there is a chance that hospital personnel will need to cut it off on the day of surgery.  Do not wear lotions, powders, or perfumes.   Do not shave body hair from the neck down 48 hours before surgery.  Contact lenses, hearing aids and dentures may not be worn into surgery.  Do not bring valuables to the hospital. [facility] is not responsible for any missing/lost belongings or valuables.   Notify your doctor if there is any change in your medical condition (cold, fever, infection).  Wear comfortable clothing (specific to your surgery type) to the hospital.  After surgery, you can help prevent lung complications by doing breathing exercises.  Take deep breaths and cough every 1-2 hours. Your doctor may order a device called an Incentive Spirometer to help you take deep breaths. When coughing or sneezing, hold a pillow firmly against your incision with both hands. This is called "splinting." Doing this helps protect your incision. It also decreases belly discomfort.  If you are being admitted to the hospital overnight, leave your suitcase in the car. After surgery it may be brought to your room.  In case of increased patient census, it may be necessary for you, the patient, to continue your postoperative care in the Same Day Surgery  department.  If you are being discharged the day of surgery, you will not be allowed to drive home. You will need a responsible individual to drive you home and stay with you for 24 hours after surgery.   If you are taking public transportation, you will need to have a responsible individual with you.  Please call the Pre-admissions Testing Dept. at [phone] if you have any questions about these  instructions.  Surgery Visitation Policy:  Patients having surgery or a procedure may have two visitors.  Children under the age of 59 must have an adult with them who is not the patient.  Inpatient Visitation:    Visiting hours are 7 a.m. to 8 p.m. Up to four visitors are allowed at one time in a patient room. The visitors may rotate out with other people during the day.  One visitor age 97 or older may stay with the patient overnight and must be in the room by 8 p.m.

## 2023-06-28 ENCOUNTER — Telehealth: Payer: Self-pay | Admitting: Urology

## 2023-06-28 NOTE — Addendum Note (Signed)
Addended by: Letta Kocher A on: 06/28/2023 02:41 PM   Modules accepted: Orders

## 2023-06-28 NOTE — Telephone Encounter (Signed)
Spoke with pts daughter advised of results. Medication added to OR orders.

## 2023-06-28 NOTE — Telephone Encounter (Signed)
Preprocedure UA / UCx grew very low colony count of Pseudomonas but this was highly resistant.  Repeat UA at PCP looks good so likely not a true infection.    I would like to add IV gent (dosing per pharmacy) to preop abx a precaution.  Vanna Scotland, MD

## 2023-06-30 MED ORDER — CEFAZOLIN SODIUM-DEXTROSE 2-4 GM/100ML-% IV SOLN
2.0000 g | INTRAVENOUS | Status: AC
  Administered 2023-07-01: 2 g via INTRAVENOUS

## 2023-06-30 MED ORDER — GEMCITABINE CHEMO FOR BLADDER INSTILLATION 2000 MG
2000.0000 mg | Freq: Once | INTRAVENOUS | Status: AC
  Administered 2023-07-01: 2000 mg via INTRAVESICAL
  Filled 2023-06-30: qty 2000

## 2023-06-30 MED ORDER — ORAL CARE MOUTH RINSE: 15.0000 mL | Freq: Once | OROMUCOSAL | Status: AC

## 2023-06-30 MED ORDER — CHLORHEXIDINE GLUCONATE 0.12 % MT SOLN
15.0000 mL | Freq: Once | OROMUCOSAL | Status: AC
  Administered 2023-07-01: 15 mL via OROMUCOSAL

## 2023-06-30 MED ORDER — LACTATED RINGERS IV SOLN: INTRAVENOUS | Status: DC

## 2023-06-30 MED ORDER — GENTAMICIN SULFATE 40 MG/ML IJ SOLN
2.5000 mg/kg | Freq: Once | INTRAVENOUS | Status: DC
  Filled 2023-06-30: qty 2.75

## 2023-07-01 ENCOUNTER — Ambulatory Visit
Admission: RE | Admit: 2023-07-01 | Discharge: 2023-07-01 | Disposition: A | Discharge status: Home / Self Care | Payer: PPO | Attending: Urology | Admitting: Urology

## 2023-07-01 ENCOUNTER — Encounter: Payer: Self-pay | Admitting: Urology

## 2023-07-01 ENCOUNTER — Ambulatory Visit: Payer: PPO

## 2023-07-01 ENCOUNTER — Encounter
Admission: RE | Disposition: A | Discharge status: Home / Self Care | Payer: Self-pay | Source: Home / Self Care | Attending: Urology

## 2023-07-01 ENCOUNTER — Ambulatory Visit: Payer: PPO | Admitting: General Practice

## 2023-07-01 ENCOUNTER — Other Ambulatory Visit: Payer: Self-pay

## 2023-07-01 DIAGNOSIS — Z87442 Personal history of urinary calculi: Secondary | ICD-10-CM | POA: Diagnosis not present

## 2023-07-01 DIAGNOSIS — I700 Atherosclerosis of aorta: Secondary | ICD-10-CM | POA: Diagnosis not present

## 2023-07-01 DIAGNOSIS — Z905 Acquired absence of kidney: Secondary | ICD-10-CM | POA: Insufficient documentation

## 2023-07-01 DIAGNOSIS — R7303 Prediabetes: Secondary | ICD-10-CM | POA: Insufficient documentation

## 2023-07-01 DIAGNOSIS — Z87891 Personal history of nicotine dependence: Secondary | ICD-10-CM | POA: Insufficient documentation

## 2023-07-01 DIAGNOSIS — C679 Malignant neoplasm of bladder, unspecified: Secondary | ICD-10-CM

## 2023-07-01 DIAGNOSIS — I509 Heart failure, unspecified: Secondary | ICD-10-CM | POA: Diagnosis not present

## 2023-07-01 DIAGNOSIS — C678 Malignant neoplasm of overlapping sites of bladder: Secondary | ICD-10-CM | POA: Insufficient documentation

## 2023-07-01 DIAGNOSIS — I110 Hypertensive heart disease with heart failure: Secondary | ICD-10-CM | POA: Insufficient documentation

## 2023-07-01 DIAGNOSIS — K219 Gastro-esophageal reflux disease without esophagitis: Secondary | ICD-10-CM | POA: Diagnosis not present

## 2023-07-01 DIAGNOSIS — Z2989 Encounter for other specified prophylactic measures: Secondary | ICD-10-CM

## 2023-07-01 DIAGNOSIS — Z85528 Personal history of other malignant neoplasm of kidney: Secondary | ICD-10-CM | POA: Diagnosis not present

## 2023-07-01 DIAGNOSIS — D494 Neoplasm of unspecified behavior of bladder: Secondary | ICD-10-CM

## 2023-07-01 DIAGNOSIS — I340 Nonrheumatic mitral (valve) insufficiency: Secondary | ICD-10-CM | POA: Diagnosis not present

## 2023-07-01 HISTORY — PX: BLADDER INSTILLATION: SHX6893

## 2023-07-01 HISTORY — PX: TRANSURETHRAL RESECTION OF BLADDER TUMOR: SHX2575

## 2023-07-01 HISTORY — PX: CYSTOSCOPY W/ RETROGRADES: SHX1426

## 2023-07-01 SURGERY — CYSTOSCOPY, WITH RETROGRADE PYELOGRAM
Anesthesia: General | Laterality: Right

## 2023-07-01 MED ORDER — DEXAMETHASONE SODIUM PHOSPHATE 10 MG/ML IJ SOLN
INTRAMUSCULAR | Status: AC
  Filled 2023-07-01: qty 1

## 2023-07-01 MED ORDER — SUGAMMADEX SODIUM 200 MG/2ML IV SOLN
INTRAVENOUS | Status: DC | PRN
  Administered 2023-07-01: 170 mg via INTRAVENOUS

## 2023-07-01 MED ORDER — LIDOCAINE HCL (CARDIAC) PF 100 MG/5ML IV SOSY
PREFILLED_SYRINGE | INTRAVENOUS | Status: DC | PRN
  Administered 2023-07-01: 40 mg via INTRAVENOUS

## 2023-07-01 MED ORDER — ONDANSETRON HCL 4 MG/2ML IJ SOLN
INTRAMUSCULAR | Status: DC | PRN
  Administered 2023-07-01: 4 mg via INTRAVENOUS

## 2023-07-01 MED ORDER — FENTANYL CITRATE (PF) 100 MCG/2ML IJ SOLN
INTRAMUSCULAR | Status: AC
  Filled 2023-07-01: qty 2

## 2023-07-01 MED ORDER — OXYBUTYNIN CHLORIDE 5 MG PO TABS: 5.0000 mg | ORAL_TABLET | Freq: Three times a day (TID) | ORAL | 0 refills | Status: DC | PRN

## 2023-07-01 MED ORDER — FENTANYL CITRATE (PF) 100 MCG/2ML IJ SOLN
INTRAMUSCULAR | Status: DC | PRN
  Administered 2023-07-01 (×2): 50 ug via INTRAVENOUS

## 2023-07-01 MED ORDER — ROCURONIUM BROMIDE 100 MG/10ML IV SOLN
INTRAVENOUS | Status: DC | PRN
  Administered 2023-07-01: 30 mg via INTRAVENOUS

## 2023-07-01 MED ORDER — DEXAMETHASONE SODIUM PHOSPHATE 10 MG/ML IJ SOLN
INTRAMUSCULAR | Status: DC | PRN
  Administered 2023-07-01: 6 mg via INTRAVENOUS

## 2023-07-01 MED ORDER — IOHEXOL 180 MG/ML  SOLN
INTRAMUSCULAR | Status: DC | PRN
  Administered 2023-07-01: 10 mL

## 2023-07-01 MED ORDER — OXYCODONE HCL 5 MG/5ML PO SOLN: 5.0000 mg | Freq: Once | ORAL | Status: DC | PRN

## 2023-07-01 MED ORDER — SODIUM CHLORIDE 0.9 % IR SOLN
Status: DC | PRN
  Administered 2023-07-01: 6000 mL via INTRAVESICAL

## 2023-07-01 MED ORDER — HYDROCODONE-ACETAMINOPHEN 5-325 MG PO TABS
1.0000 | ORAL_TABLET | Freq: Four times a day (QID) | ORAL | 0 refills | Status: DC | PRN
Start: 2023-07-01 — End: 2023-07-10

## 2023-07-01 MED ORDER — CHLORHEXIDINE GLUCONATE 0.12 % MT SOLN
OROMUCOSAL | Status: AC
  Filled 2023-07-01: qty 15

## 2023-07-01 MED ORDER — ONDANSETRON HCL 4 MG/2ML IJ SOLN
INTRAMUSCULAR | Status: AC
  Filled 2023-07-01: qty 2

## 2023-07-01 MED ORDER — PROPOFOL 10 MG/ML IV BOLUS
INTRAVENOUS | Status: DC | PRN
  Administered 2023-07-01: 25 ug/kg/min via INTRAVENOUS
  Administered 2023-07-01: 80 mg via INTRAVENOUS

## 2023-07-01 MED ORDER — FENTANYL CITRATE (PF) 100 MCG/2ML IJ SOLN
25.0000 ug | INTRAMUSCULAR | Status: DC | PRN
  Administered 2023-07-01: 25 ug via INTRAVENOUS

## 2023-07-01 MED ORDER — CEFAZOLIN SODIUM-DEXTROSE 2-4 GM/100ML-% IV SOLN
INTRAVENOUS | Status: AC
  Filled 2023-07-01: qty 100

## 2023-07-01 MED ORDER — OXYCODONE HCL 5 MG PO TABS: 5.0000 mg | ORAL_TABLET | Freq: Once | ORAL | Status: DC | PRN

## 2023-07-01 SURGICAL SUPPLY — 35 items
BAG DRAIN SIEMENS DORNER NS (MISCELLANEOUS) ×2 IMPLANT
BAG DRN NS LF (MISCELLANEOUS) ×2
BAG DRN RND TRDRP ANRFLXCHMBR (UROLOGICAL SUPPLIES) ×2
BAG URINE DRAIN 2000ML AR STRL (UROLOGICAL SUPPLIES) ×2 IMPLANT
BRUSH SCRUB EZ 1% IODOPHOR (MISCELLANEOUS) ×2 IMPLANT
BRUSH SCRUB EZ 4% CHG (MISCELLANEOUS) ×2 IMPLANT
CATH FOLEY 2WAY 5CC 16FR (CATHETERS) ×2
CATH URETL OPEN 5X70 (CATHETERS) ×2 IMPLANT
CATH URTH 16FR FL 2W BLN LF (CATHETERS) ×2 IMPLANT
CNTNR URN SCR LID CUP LEK RST (MISCELLANEOUS) IMPLANT
CONT SPEC 4OZ STRL OR WHT (MISCELLANEOUS) ×2
DRAPE UTILITY 15X26 TOWEL STRL (DRAPES) ×2 IMPLANT
DRSG TELFA 3X4 N-ADH STERILE (GAUZE/BANDAGES/DRESSINGS) ×2 IMPLANT
ELECT LOOP 22F BIPOLAR SML (ELECTROSURGICAL) ×2
ELECT REM PT RETURN 9FT ADLT (ELECTROSURGICAL)
ELECTRODE LOOP 22F BIPOLAR SML (ELECTROSURGICAL) IMPLANT
ELECTRODE REM PT RTRN 9FT ADLT (ELECTROSURGICAL) IMPLANT
GLOVE BIO SURGEON STRL SZ 6.5 (GLOVE) ×2 IMPLANT
GOWN STRL REUS W/ TWL LRG LVL3 (GOWN DISPOSABLE) ×4 IMPLANT
GOWN STRL REUS W/TWL LRG LVL3 (GOWN DISPOSABLE) ×4
GUIDEWIRE STR DUAL SENSOR (WIRE) ×2 IMPLANT
IV NS IRRIG 3000ML ARTHROMATIC (IV SOLUTION) ×2 IMPLANT
KIT TURNOVER CYSTO (KITS) ×2 IMPLANT
LOOP CUT BIPOLAR 24F LRG (ELECTROSURGICAL) IMPLANT
NDL SAFETY ECLIP 18X1.5 (MISCELLANEOUS) ×2 IMPLANT
PACK CYSTO AR (MISCELLANEOUS) ×2 IMPLANT
PAD ARMBOARD 7.5X6 YLW CONV (MISCELLANEOUS) ×2 IMPLANT
SET CYSTO W/LG BORE CLAMP LF (SET/KITS/TRAYS/PACK) ×2 IMPLANT
SET IRRIG Y TYPE TUR BLADDER L (SET/KITS/TRAYS/PACK) ×2 IMPLANT
SURGILUBE 2OZ TUBE FLIPTOP (MISCELLANEOUS) ×2 IMPLANT
SYR TOOMEY IRRIG 70ML (MISCELLANEOUS) ×2
SYRINGE TOOMEY IRRIG 70ML (MISCELLANEOUS) ×2 IMPLANT
WATER STERILE IRR 1000ML POUR (IV SOLUTION) ×2 IMPLANT
WATER STERILE IRR 3000ML UROMA (IV SOLUTION) IMPLANT
WATER STERILE IRR 500ML POUR (IV SOLUTION) ×2 IMPLANT

## 2023-07-01 NOTE — Op Note (Signed)
Date of procedure: 07/01/23  Preoperative diagnosis:  history of left upper tract urothelial carcinoma Bladder cancer recurrence, multifocal  Postoperative diagnosis:  Same as above  Procedure: Cystoscopy Right retrograde pyelogram TURBT, small Instillation of intravesical gemcitabine  Surgeon: Vanna Scotland, MD  Anesthesia: General  Complications: None  Intraoperative findings: 7-8 tumors, measuring up to about 1.85 cm primarily over the left lateral bladder wall, left trigone, left bladder neck and left posterior bladder wall.  EBL: Minimal  Specimens: Bladder tumors  Drains: 16 Foley  Indication: Jessica Morrison is a 77 y.o. patient with presents today for left upper tract urothelial carcinoma status post left nephro ureterectomy preceded by neoadjuvant chemotherapy found to have multiple multifocal recurrent bladder tumors.  After reviewing the management options for treatment, she elected to proceed with the above surgical procedure(s). We have discussed the potential benefits and risks of the procedure, side effects of the proposed treatment, the likelihood of the patient achieving the goals of the procedure, and any potential problems that might occur during the procedure or recuperation. Informed consent has been obtained.  Description of procedure:  The patient was taken to the operating room and general anesthesia was induced.  The patient was placed in the dorsal lithotomy position, prepped and draped in the usual sterile fashion, and preoperative antibiotics were administered. A preoperative time-out was performed.   A 26 French resectoscope was advanced using an obturator.  The bladder was inspected.  There is multiple tumors involving the left bladder neck, left trigone, left lateral bladder wall and posterior bladder walls measuring up to 1.5 cm each.  There were 4 dominant tumors of the larger variety and 3 or 4 several other additional very small satellite  tumors.  Next, using open-ended ureteral catheter, the right UO was intubated just within the orifice.  A gentle retrograde pyelogram on the side showed no filling defects or hydroureteronephrosis.  Next, bipolar loop was brought in using saline as the medium, each of the tumors were resected.  They were primarily spherical, papillary and had a somewhat superficial appearance.  The base of these were then fulgurated.  I did resect overlying the left trigone.  The bladder tumor chips were then evacuated from the bladder.  Hemostasis is excellent.  The scope was then removed.  A 16 French Foley catheter was inserted and filled with 10 cc of sterile water.  She was cleaned dried, repositioned in supine position, versed with seizure, taken to PACU in stable condition.  In the PACU 2000 mg of intravesical gemcitabine was allowed to dwell.  At the end of 1 hour was drained and the Foley catheter was removed.  This was well-tolerated.  Plan: Will have her follow-up in the next 1 to 2 weeks in the office to review pathology and discuss the potential need for additional treatments.  Vanna Scotland, M.D.

## 2023-07-01 NOTE — Anesthesia Postprocedure Evaluation (Signed)
Anesthesia Post Note  Patient: Jessica Morrison  Procedure(s) Performed: CYSTOSCOPY WITH RETROGRADE PYELOGRAM (Right) TRANSURETHRAL RESECTION OF BLADDER TUMOR (TURBT) BLADDER INSTILLATION OF GEMCITABINE  Patient location during evaluation: PACU Anesthesia Type: General Level of consciousness: awake and alert Pain management: pain level controlled Vital Signs Assessment: post-procedure vital signs reviewed and stable Respiratory status: spontaneous breathing, nonlabored ventilation, respiratory function stable and patient connected to nasal cannula oxygen Cardiovascular status: blood pressure returned to baseline and stable Postop Assessment: no apparent nausea or vomiting Anesthetic complications: no   No notable events documented.   Last Vitals:  Vitals:   07/01/23 1628 07/01/23 1715  BP: (!) 148/72 (!) 148/74  Pulse: 86 84  Resp: 18 16  Temp:    SpO2: 99% 97%    Last Pain:  Vitals:   07/01/23 1715  TempSrc:   PainSc: 0-No pain                 Cleda Mccreedy Anina Schnake

## 2023-07-01 NOTE — Discharge Instructions (Addendum)
Transurethral Resection of Bladder Tumor (TURBT) or Bladder Biopsy   Definition:  Transurethral Resection of the Bladder Tumor is a surgical procedure used to diagnose and remove tumors within the bladder. TURBT is the most common treatment for early stage bladder cancer.  General instructions:     Your recent bladder surgery requires very little post hospital care but some definite precautions.  Despite the fact that no skin incisions were used, the area around the bladder incisions are raw and covered with scabs to promote healing and prevent bleeding. Certain precautions are needed to insure that the scabs are not disturbed over the next 2-4 weeks while the healing proceeds.  Because the raw surface inside your bladder and the irritating effects of urine you may expect frequency of urination and/or urgency (a stronger desire to urinate) and perhaps even getting up at night more often. This will usually resolve or improve slowly over the healing period. You may see some blood in your urine over the first 6 weeks. Do not be alarmed, even if the urine was clear for a while. Get off your feet and drink lots of fluids until clearing occurs. If you start to pass clots or don't improve call us.  Diet:  You may return to your normal diet immediately. Because of the raw surface of your bladder, alcohol, spicy foods, foods high in acid and drinks with caffeine may cause irritation or frequency and should be used in moderation. To keep your urine flowing freely and avoid constipation, drink plenty of fluids during the day (8-10 glasses). Tip: Avoid cranberry juice because it is very acidic.  Activity:  Your physical activity doesn't need to be restricted. However, if you are very active, you may see some blood in the urine. We suggest that you reduce your activity under the circumstances until the bleeding has stopped.  Bowels:  It is important to keep your bowels regular during the postoperative  period. Straining with bowel movements can cause bleeding. A bowel movement every other day is reasonable. Use a mild laxative if needed, such as milk of magnesia 2-3 tablespoons, or 2 Dulcolax tablets. Call if you continue to have problems. If you had been taking narcotics for pain, before, during or after your surgery, you may be constipated. Take a laxative if necessary.    Medication:  You should resume your pre-surgery medications unless told not to. In addition you may be given an antibiotic to prevent or treat infection. Antibiotics are not always necessary. All medication should be taken as prescribed until the bottles are finished unless you are having an unusual reaction to one of the drugs.   [facility] [place], [place] [address] [phone]    AMBULATORY SURGERY  DISCHARGE INSTRUCTIONS   The drugs that you were given will stay in your system until tomorrow so for the next 24 hours you should not:  Drive an automobile Make any legal decisions Drink any alcoholic beverage   You may resume regular meals tomorrow.  Today it is better to start with liquids and gradually work up to solid foods.  You may eat anything you prefer, but it is better to start with liquids, then soup and crackers, and gradually work up to solid foods.   Please notify your doctor immediately if you have any unusual bleeding, trouble breathing, redness and pain at the surgery site, drainage, fever, or pain not relieved by medication.    Additional Instructions:        Please contact  your physician with any problems or Same Day Surgery at [phone], Monday through Friday 6 am to 4 pm, or [place] at [facility] number at [phone].

## 2023-07-01 NOTE — Transfer of Care (Signed)
Immediate Anesthesia Transfer of Care Note  Patient: Jessica Morrison  Procedure(s) Performed: CYSTOSCOPY WITH RETROGRADE PYELOGRAM (Right) TRANSURETHRAL RESECTION OF BLADDER TUMOR (TURBT) BLADDER INSTILLATION OF GEMCITABINE  Patient Location: PACU  Anesthesia Type:General  Level of Consciousness: awake and alert   Airway & Oxygen Therapy: Patient Spontanous Breathing  Post-op Assessment: Report given to RN and Post -op Vital signs reviewed and stable  Post vital signs: Reviewed and stable  Last Vitals:  Vitals Value Taken Time  BP 145/72   Temp    Pulse 82 07/01/23 1600  Resp    SpO2 100 % 07/01/23 1600  Vitals shown include unfiled device data.  Last Pain:  Vitals:   07/01/23 1250  TempSrc: Temporal  PainSc: 6          Complications: No notable events documented.

## 2023-07-01 NOTE — Anesthesia Procedure Notes (Signed)
Procedure Name: Intubation Date/Time: 07/01/2023 3:09 PM  Performed by: Irving Burton, CRNAPre-anesthesia Checklist: Patient identified, Patient being monitored, Timeout performed, Emergency Drugs available and Suction available Patient Re-evaluated:Patient Re-evaluated prior to induction Oxygen Delivery Method: Circle system utilized Preoxygenation: Pre-oxygenation with 100% oxygen Induction Type: IV induction Ventilation: Mask ventilation without difficulty Laryngoscope Size: 3 and McGraph Grade View: Grade I Tube type: Oral Tube size: 6.5 mm Number of attempts: 1 Airway Equipment and Method: Stylet and Video-laryngoscopy Placement Confirmation: ETT inserted through vocal cords under direct vision, positive ETCO2 and breath sounds checked- equal and bilateral Secured at: 19 cm Tube secured with: Tape Dental Injury: Teeth and Oropharynx as per pre-operative assessment

## 2023-07-01 NOTE — Anesthesia Preprocedure Evaluation (Signed)
Anesthesia Evaluation  Patient identified by MRN, date of birth, ID band Patient awake    Reviewed: Allergy & Precautions, NPO status , Patient's Chart, lab work & pertinent test results  History of Anesthesia Complications (+) PONV and history of anesthetic complications  Airway Mallampati: II  TM Distance: >3 FB Neck ROM: full    Dental  (+) Chipped, Dental Advidsory Given   Pulmonary neg pulmonary ROS, neg sleep apnea, neg COPD, Patient abstained from smoking.Not current smoker   Pulmonary exam normal breath sounds clear to auscultation       Cardiovascular Exercise Tolerance: Good hypertension, +CHF  (-) CAD and (-) Past MI Normal cardiovascular exam(-) dysrhythmias  Rhythm:Regular Rate:Normal - Systolic murmurs 56-38-75 1. Left ventricular ejection fraction, by estimation, is 60 to 65%. The  left ventricle has normal function. The left ventricle has no regional  wall motion abnormalities. Left ventricular diastolic parameters are  consistent with Grade I diastolic  dysfunction (impaired relaxation).   2. Right ventricular systolic function is normal. The right ventricular  size is normal.   3. The posterior leaflet of the mitral valve is mildly restircted with  mild posterior MR. The mitral valve is normal in structure. Mild mitral  valve regurgitation. No evidence of mitral stenosis.   4. The aortic valve is tricuspid. There is mild calcification of the  aortic valve. Aortic valve regurgitation is trivial. Aortic valve  sclerosis/calcification is present, without any evidence of aortic  stenosis.   5. The inferior vena cava is normal in size with greater than 50%  respiratory variability, suggesting right atrial pressure of 3 mmHg.       Neuro/Psych       Dementia Neuro/Psych       Dementia 04-06-23 MRI  IMPRESSION:  1. Redemonstrated are multiple predominantly cortical  microhemorrhages bilateral cerebral  hemispheres with a new  microhemorrhage in the right frontal lobe and possibly the right  occipital lobe. These findings are nonspecific but can be seen in  the setting of amyloid angiopathy.  2. Small bilateral extra-axial, likely subdural, fluid collections  measuring to 2 mm, which are new from prior exam. These are  nonspecific and may represent subdural hygromas or chronic subdural  hematomas. Correlate with history of trauma. An additional  differential consideration is CSF hypotension, but there are no  other findings to confirm intracranial hypotension     negative neurological ROS  negative psych ROS   GI/Hepatic Neg liver ROS,GERD  Medicated and Controlled,,  Endo/Other  negative endocrine ROSneg diabetes    Renal/GU Renal diseaseRenal cancer     Musculoskeletal   Abdominal   Peds  Hematology negative hematology ROS (+)   Anesthesia Other Findings Endometriosis  Nephrolithiasis Hyperlipemia Fibrocystic disease of breast Raynaud disease  Thyromegaly Osteopenia  Hypertension Curvature of spine  Back pain Fibroid Kidney stone Osteoporosis  GERD (gastroesophageal reflux disease) Pelvic pain in female  HLD (hyperlipidemia) Cancer (HCC) Melanoma in situ (HCC) Basal cell carcinoma  Dysplastic nevus History of kidney stones Pre-diabetes  Renal cancer, left (HCC) Osteoarthritis B12 deficiency Memory loss PONV (postoperative nausea and vomiting) Mild mitral regurgitation by prior echocardiogram  Grade I diastolic dysfunction Urothelial carcinoma of kidney, left  Transitional cell carcinoma of kidney, left  Reproductive/Obstetrics negative OB ROS                             Anesthesia Physical Anesthesia Plan  ASA: 3  Anesthesia Plan: MAC and  General   Post-op Pain Management:    Induction: Intravenous  PONV Risk Score and Plan: 3 and Ondansetron, Midazolam and TIVA  Airway Management Planned: Oral ETT  Additional  Equipment:   Intra-op Plan:   Post-operative Plan:   Informed Consent: I have reviewed the patients History and Physical, chart, labs and discussed the procedure including the risks, benefits and alternatives for the proposed anesthesia with the patient or authorized representative who has indicated his/her understanding and acceptance.     Dental Advisory Given  Plan Discussed with: Anesthesiologist, CRNA and Surgeon  Anesthesia Plan Comments: (Patient consented for risks of anesthesia including but not limited to:  - adverse reactions to medications - damage to eyes, teeth, lips or other oral mucosa - nerve damage due to positioning  - sore throat or hoarseness - Damage to heart, brain, nerves, lungs, other parts of body or loss of life  Patient voiced understanding.)        Anesthesia Quick Evaluation

## 2023-07-01 NOTE — Interval H&P Note (Signed)
History and Physical Interval Note:  07/01/2023 2:40 PM  Jessica Morrison  has presented today for surgery, with the diagnosis of Bladder Tumor.  The various methods of treatment have been discussed with the patient and family. After consideration of risks, benefits and other options for treatment, the patient has consented to  Procedure(s): CYSTOSCOPY WITH RETROGRADE PYELOGRAM (Right) TRANSURETHRAL RESECTION OF BLADDER TUMOR (TURBT) (N/A) BLADDER INSTILLATION OF GEMCITABINE (N/A) as a surgical intervention.  The patient's history has been reviewed, patient examined, no change in status, stable for surgery.  I have reviewed the patient's chart and labs.  Questions were answered to the patient's satisfaction.    RRR CTAB   Vanna Scotland

## 2023-07-02 ENCOUNTER — Encounter: Payer: Self-pay | Admitting: Urology

## 2023-07-10 ENCOUNTER — Ambulatory Visit: Payer: PPO | Admitting: Urology

## 2023-07-10 VITALS — BP 144/74 | HR 78 | Ht <= 58 in | Wt 96.1 lb

## 2023-07-10 DIAGNOSIS — C689 Malignant neoplasm of urinary organ, unspecified: Secondary | ICD-10-CM | POA: Diagnosis not present

## 2023-07-10 NOTE — Progress Notes (Signed)
Jessica Morrison,acting as a scribe for Vanna Scotland, MD.,have documented all relevant documentation on the behalf of Vanna Scotland, MD,as directed by  Vanna Scotland, MD while in the presence of Vanna Scotland, MD.  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   Please see previous notes for details.  She underwent TURBT with right retrograde pyelogram and instillation of intravesical gemcitabine on 04/03/2023. Surgical pathology was consistent with high grade urothelial with focal area suspicious for focal invasion into the lamina propria. There was muscularis propria present and not involved.   Tolerated surgery well, no major complains today.   PMH: Past Medical History:  Diagnosis Date   B12 deficiency    Back pain    Basal cell carcinoma 08/04/2020   0.5cm above the left mid brow, EDC 09/15/20   Cancer (HCC)    Melanoma   Curvature of spine    Dysplastic nevus 04/30/2013   left post shoulder, left parasternal   Dysplastic nevus 05/04/2014   right prox ant deltoid, left lat buttock   Dysplastic nevus 03/26/2019   right lat neck adjacent to the inf top of MM in situ site scar   Dysplastic nevus 10/29/2019   upper back left paraspinal at level of mid scapula   Dysplastic nevus 03/31/2020, 05/12/20 shave removal   Left mid pretibial. Moderate to severe atypia, deep margin involved.   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   right lat neck inf to right angle of mandible, excised: 07/29/2018, margins free   Memory loss    Mild mitral regurgitation by prior echocardiogram    Nephrolithiasis    Osteoarthritis    Osteopenia    Osteoporosis    Pelvic pain in female    PONV (postoperative nausea and vomiting)    Pre-diabetes    Raynaud disease    Renal cancer, left (HCC) 03/2023   Thyromegaly    Transitional cell carcinoma of kidney, left (HCC)    Urothelial carcinoma of kidney, left (HCC)     Surgical History: Past Surgical History:  Procedure Laterality Date   ABDOMINAL HYSTERECTOMY     BILATERAL OOPHORECTOMY  1999   BLADDER INSTILLATION N/A 04/01/2023   Procedure: BLADDER INSTILLATION OF GEMCITABINE;  Surgeon: Vanna Scotland, MD;  Location: [facility];  Service: Urology;  Laterality: N/A;   BLADDER INSTILLATION N/A 07/01/2023   Procedure: BLADDER INSTILLATION OF GEMCITABINE;  Surgeon: Vanna Scotland, MD;  Location: [facility];  Service: Urology;  Laterality: N/A;   CATARACT EXTRACTION W/PHACO Right 05/09/2023   Procedure: CATARACT EXTRACTION PHACO AND INTRAOCULAR LENS PLACEMENT (IOC) RIGHT MALYUGIN OMIDRIA 7.79 00:59.0;  Surgeon: Estanislado Pandy, MD;  Location: [facility];  Service: Ophthalmology;  Laterality: Right;   CATARACT EXTRACTION W/PHACO Left 05/23/2023  Procedure: CATARACT EXTRACTION PHACO AND INTRAOCULAR LENS PLACEMENT (IOC) LEFT MALYUGIN OMIDRIA 12.13 1:23.6;  Surgeon: Estanislado Pandy, MD;  Location: [facility];  Service: Ophthalmology;  Laterality: Left;   CESAREAN SECTION     COLONOSCOPY  09/02/2020   COLONOSCOPY WITH PROPOFOL N/A 05/09/2015   Procedure: COLONOSCOPY WITH PROPOFOL;  Surgeon: Elnita Maxwell, MD;  Location: [facility];  Service: Endoscopy;  Laterality: N/A;   CYSTOSCOPY W/ RETROGRADES Right 07/01/2023   Procedure:  CYSTOSCOPY WITH RETROGRADE PYELOGRAM;  Surgeon: Vanna Scotland, MD;  Location: [facility];  Service: Urology;  Laterality: Right;   CYSTOSCOPY WITH RETROGRADE PYELOGRAM, URETEROSCOPY AND STENT PLACEMENT Left 10/09/2022   Procedure: CYSTOSCOPY WITH RETROGRADE PYELOGRAM, URETEROSCOPY AND STENT PLACEMENT;  Surgeon: Riki Altes, MD;  Location: [facility];  Service: Urology;  Laterality: Left;   IR IMAGING GUIDED PORT INSERTION  12/06/2022   MELANOMA EXCISION Right 07/29/2018   lateral neck inf. to right angle of mandible   OOPHORECTOMY     ROBOT ASSITED LAPAROSCOPIC NEPHROURETERECTOMY Left 04/01/2023   Procedure: XI ROBOT ASSITED LAPAROSCOPIC NEPHROURETERECTOMY;  Surgeon: Vanna Scotland, MD;  Location: [facility];  Service: Urology;  Laterality: Left;   TRANSURETHRAL RESECTION OF BLADDER TUMOR N/A 07/01/2023   Procedure: TRANSURETHRAL RESECTION OF BLADDER TUMOR (TURBT);  Surgeon: Vanna Scotland, MD;  Location: [facility];  Service: Urology;  Laterality: N/A;   URETERAL BIOPSY Left 10/09/2022   Procedure: URETERAL BIOPSY;  Surgeon: Riki Altes, MD;  Location: [facility];  Service: Urology;  Laterality: Left;    Home Medications:  Allergies as of 07/10/2023       Reactions   Codeine Nausea And Vomiting   Neosporin Wound Cleanser [benzalkonium Chloride] Other (See Comments)   Skin blisters   Penicillins Hives        Medication List        Accurate as of July 10, 2023  5:16 PM. If you have any questions, ask your nurse or doctor.          STOP taking these medications    HYDROcodone-acetaminophen 5-325 MG tablet Commonly known as: NORCO/VICODIN       TAKE these medications    acetaminophen 325 MG tablet Commonly known as: TYLENOL Take 650 mg by mouth every 6 (six) hours as needed.   calcium-vitamin D 500-200 MG-UNIT tablet Commonly known as: OSCAL WITH D 1 tablet daily with breakfast.   Cholecalciferol 25 MCG (1000 UT) tablet Take 1,000 Units by mouth daily.    cyanocobalamin 1000 MCG tablet Take 1,000 mcg by mouth daily.   desvenlafaxine 25 MG 24 hr tablet Commonly known as: PRISTIQ Take 1 tablet by mouth daily.   donepezil 10 MG tablet Commonly known as: ARICEPT TAKE 1 TABLET BY MOUTH ONCE EVERY EVENING   lidocaine-prilocaine cream Commonly known as: EMLA Apply to affected area once   memantine 5 MG tablet Commonly known as: NAMENDA Take 5 mg by mouth in the morning and at bedtime.   omeprazole 40 MG capsule Commonly known as: PRILOSEC 40 mg daily.   ondansetron 8 MG tablet Commonly known as: Zofran Take 1 tablet (8 mg total) by mouth every 8 (eight) hours as needed for nausea or vomiting. Start on the third day after cisplatin.   oxybutynin 5 MG tablet Commonly known as: DITROPAN Take 1 tablet (5 mg total) by mouth every 8 (eight) hours as needed for bladder spasms.   prochlorperazine 10 MG tablet Commonly known as: COMPAZINE Take 1 tablet (10 mg total) by mouth  every 6 (six) hours as needed (Nausea or vomiting).   raloxifene 60 MG tablet Commonly known as: EVISTA TAKE ONE TABLET BY MOUTH ONCE A DAY   rosuvastatin 5 MG tablet Commonly known as: CRESTOR Take 5 mg by mouth daily.        Allergies:  Allergies  Allergen Reactions   Codeine Nausea And Vomiting   Neosporin Wound Cleanser [Benzalkonium Chloride] Other (See Comments)    Skin blisters   Penicillins Hives    Family History: Family History  Problem Relation Age of Onset   Emphysema Father    Cancer Neg Hx    Heart disease Neg Hx    Diabetes Neg Hx    Breast cancer Neg Hx     Social History:  reports that she has never smoked. She has never been exposed to tobacco smoke. She has never used smokeless tobacco. She reports that she does not drink alcohol and does not use drugs.   Physical Exam: BP (!) 144/74   Pulse 78   Ht 4\' 9"  (1.448 m)   Wt 96 lb 2 oz (43.6 kg)   BMI 20.80 kg/m   Constitutional:  Alert and oriented, No acute  distress. HEENT: [place] AT, moist mucus membranes.  Trachea midline, no masses. Neurologic: Grossly intact, no focal deficits, moving all 4 extremities. Psychiatric: Normal mood and affect.   Assessment & Plan:    1 History of upper tract urothelial cancer - Status post neoadjuvant chemotherapy followed by nephroureterectomy - Bladder cancer now with local recurrence in the bladder - High grade with focal lamina propria invasion - Technically, with lamina propria invasion, we generally recommend returning to the operating room to ensure that there is no deeper section. Given that it is only focal and there was muscle in the specimen, I think we can forego this.  - She has had multiple procedures and wants to avoid further procedures.  - Recommend induction BCG x 6 - Advised to start BCG treatment in about five to six weeks to allow healing from the recent procedure. - She was informed about the potential side effects of BCG, including bladder irritation and rare systemic inflammatory response. - Information handout was provided about BCG  Return for BCG. Follow up 3 months after completion of treatment.  I have reviewed the above documentation for accuracy and completeness, and I agree with the above.   Vanna Scotland, MD   [facility] [address] [place], [place] [address] [phone]  I spent 31 total minutes on the day of the encounter including pre-visit review of the medical record, face-to-face time with the patient, and post visit ordering of labs/imaging/tests.

## 2023-07-10 NOTE — Patient Instructions (Signed)

## 2023-07-17 ENCOUNTER — Ambulatory Visit: Payer: PPO | Admitting: Internal Medicine

## 2023-07-30 ENCOUNTER — Other Ambulatory Visit: Payer: Self-pay | Admitting: *Deleted

## 2023-07-30 DIAGNOSIS — C642 Malignant neoplasm of left kidney, except renal pelvis: Secondary | ICD-10-CM

## 2023-07-31 ENCOUNTER — Ambulatory Visit
Admission: RE | Admit: 2023-07-31 | Discharge: 2023-07-31 | Disposition: A | Discharge status: Home / Self Care | Payer: PPO | Source: Ambulatory Visit | Attending: Oncology | Admitting: Oncology

## 2023-07-31 ENCOUNTER — Inpatient Hospital Stay: Payer: PPO

## 2023-07-31 ENCOUNTER — Other Ambulatory Visit: Payer: Self-pay | Admitting: Oncology

## 2023-07-31 ENCOUNTER — Inpatient Hospital Stay: Payer: PPO | Attending: Oncology

## 2023-07-31 ENCOUNTER — Other Ambulatory Visit: Payer: PPO

## 2023-07-31 DIAGNOSIS — C689 Malignant neoplasm of urinary organ, unspecified: Secondary | ICD-10-CM

## 2023-07-31 DIAGNOSIS — Z08 Encounter for follow-up examination after completed treatment for malignant neoplasm: Secondary | ICD-10-CM | POA: Diagnosis not present

## 2023-07-31 DIAGNOSIS — D649 Anemia, unspecified: Secondary | ICD-10-CM | POA: Diagnosis not present

## 2023-07-31 DIAGNOSIS — C679 Malignant neoplasm of bladder, unspecified: Secondary | ICD-10-CM | POA: Diagnosis not present

## 2023-07-31 DIAGNOSIS — Z85528 Personal history of other malignant neoplasm of kidney: Secondary | ICD-10-CM | POA: Insufficient documentation

## 2023-07-31 DIAGNOSIS — C649 Malignant neoplasm of unspecified kidney, except renal pelvis: Secondary | ICD-10-CM | POA: Insufficient documentation

## 2023-07-31 DIAGNOSIS — C642 Malignant neoplasm of left kidney, except renal pelvis: Secondary | ICD-10-CM

## 2023-07-31 LAB — CMP (CANCER CENTER ONLY)
ALT: 11 U/L (ref 0–44)
AST: 17 U/L (ref 15–41)
Albumin: 4 g/dL (ref 3.5–5.0)
Alkaline Phosphatase: 36 U/L — ABNORMAL LOW (ref 38–126)
Anion gap: 9 (ref 5–15)
BUN: 40 mg/dL — ABNORMAL HIGH (ref 8–23)
CO2: 21 mmol/L — ABNORMAL LOW (ref 22–32)
Calcium: 8.9 mg/dL (ref 8.9–10.3)
Chloride: 104 mmol/L (ref 98–111)
Creatinine: 2.07 mg/dL — ABNORMAL HIGH (ref 0.44–1.00)
GFR, Estimated: 24 mL/min — ABNORMAL LOW (ref >60)
Glucose, Bld: 93 mg/dL (ref 70–99)
Potassium: 4.4 mmol/L (ref 3.5–5.1)
Sodium: 134 mmol/L — ABNORMAL LOW (ref 135–145)
Total Bilirubin: 0.7 mg/dL (ref 0.3–1.2)
Total Protein: 6.4 g/dL — ABNORMAL LOW (ref 6.5–8.1)

## 2023-07-31 LAB — CBC WITH DIFFERENTIAL (CANCER CENTER ONLY)
Abs Immature Granulocytes: 0.03 10*3/uL (ref 0.00–0.07)
Basophils Absolute: 0 10*3/uL (ref 0.0–0.1)
Basophils Relative: 1 %
Eosinophils Absolute: 0.2 10*3/uL (ref 0.0–0.5)
Eosinophils Relative: 3 %
HCT: 30.6 % — ABNORMAL LOW (ref 36.0–46.0)
Hemoglobin: 9.9 g/dL — ABNORMAL LOW (ref 12.0–15.0)
Immature Granulocytes: 0 %
Lymphocytes Relative: 19 %
Lymphs Abs: 1.5 10*3/uL (ref 0.7–4.0)
MCH: 28.7 pg (ref 26.0–34.0)
MCHC: 32.4 g/dL (ref 30.0–36.0)
MCV: 88.7 fL (ref 80.0–100.0)
Monocytes Absolute: 0.9 10*3/uL (ref 0.1–1.0)
Monocytes Relative: 11 %
Neutro Abs: 5.3 10*3/uL (ref 1.7–7.7)
Neutrophils Relative %: 66 %
Platelet Count: 106 10*3/uL — ABNORMAL LOW (ref 150–400)
RBC: 3.45 MIL/uL — ABNORMAL LOW (ref 3.87–5.11)
RDW: 14.6 % (ref 11.5–15.5)
WBC Count: 8 10*3/uL (ref 4.0–10.5)
nRBC: 0 % (ref 0.0–0.2)

## 2023-07-31 LAB — MAGNESIUM: Magnesium: 2 mg/dL (ref 1.7–2.4)

## 2023-07-31 LAB — SAMPLE TO BLOOD BANK

## 2023-07-31 MED ORDER — HEPARIN SOD (PORK) LOCK FLUSH 100 UNIT/ML IV SOLN
500.0000 [IU] | Freq: Once | INTRAVENOUS | Status: AC
  Administered 2023-07-31: 500 [IU] via INTRAVENOUS
  Filled 2023-07-31: qty 5

## 2023-07-31 MED ORDER — HEPARIN SOD (PORK) LOCK FLUSH 100 UNIT/ML IV SOLN
INTRAVENOUS | Status: AC
  Filled 2023-07-31: qty 5

## 2023-08-05 ENCOUNTER — Other Ambulatory Visit: Payer: PPO

## 2023-08-07 ENCOUNTER — Inpatient Hospital Stay: Payer: PPO

## 2023-08-09 ENCOUNTER — Encounter: Payer: Self-pay | Admitting: Oncology

## 2023-08-09 ENCOUNTER — Other Ambulatory Visit: Payer: Self-pay | Admitting: *Deleted

## 2023-08-09 ENCOUNTER — Inpatient Hospital Stay: Payer: PPO | Admitting: Oncology

## 2023-08-09 VITALS — BP 133/73 | HR 69 | Temp 98.0°F | Resp 16 | Ht <= 58 in | Wt 96.5 lb

## 2023-08-09 DIAGNOSIS — C642 Malignant neoplasm of left kidney, except renal pelvis: Secondary | ICD-10-CM | POA: Diagnosis not present

## 2023-08-09 DIAGNOSIS — Z08 Encounter for follow-up examination after completed treatment for malignant neoplasm: Secondary | ICD-10-CM | POA: Diagnosis not present

## 2023-08-09 NOTE — Progress Notes (Signed)
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  INTERVAL HISTORY: Patient returns to clinic today for further evaluation and discussion of her imaging results.  In the interim she was noted to have noninvasive bladder lesions and is going to start BCG treatments shortly.  She continues to have a poor memory, but otherwise feels well. She has no neurologic complaints.  She denies any recent fevers or illnesses. She has no chest pain, shortness of breath, cough, or hemoptysis.  She denies any nausea, vomiting, constipation, or diarrhea.  She has no urinary complaints.  Patient offers no further specific complaints today.  REVIEW OF SYSTEMS:   Review of Systems  Constitutional: Negative.  Negative for fever, malaise/fatigue and weight loss.  Respiratory: Negative.  Negative for cough and shortness of breath.   Cardiovascular: Negative.  Negative for chest pain and leg swelling.  Gastrointestinal: Negative.  Negative for abdominal pain.  Genitourinary: Negative.  Negative for hematuria.  Musculoskeletal: Negative.  Negative for back pain.  Skin: Negative.  Negative for rash.  Neurological: Negative.  Negative for dizziness, focal weakness, weakness and headaches.  Psychiatric/Behavioral:  Positive for memory loss. The patient is not nervous/anxious.     As per HPI. Otherwise, a complete review of systems is negative.  PAST MEDICAL HISTORY: Past Medical History:  Diagnosis Date   B12 deficiency    Back pain    Basal cell carcinoma 08/04/2020   0.5cm above the left mid brow, EDC 09/15/20   Cancer (HCC)    Melanoma   Curvature of spine    Dysplastic nevus 04/30/2013   left post shoulder, left parasternal   Dysplastic nevus 05/04/2014   right prox ant  deltoid, left lat buttock   Dysplastic nevus 03/26/2019   right lat neck adjacent to the inf top of MM in situ site scar   Dysplastic nevus 10/29/2019   upper back left paraspinal at level of mid scapula   Dysplastic nevus 03/31/2020, 05/12/20 shave removal   Left mid pretibial. Moderate to severe atypia, deep margin involved.   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   right lat neck inf to right angle of mandible, excised: 07/29/2018, margins free   Memory loss    Mild mitral regurgitation by prior echocardiogram    Nephrolithiasis    Osteoarthritis    Osteopenia    Osteoporosis    Pelvic pain in female    PONV (postoperative nausea and vomiting)    Pre-diabetes    Raynaud disease    Renal cancer, left (HCC) 03/2023   Thyromegaly    Transitional cell carcinoma of kidney, left (HCC)    Urothelial carcinoma of kidney, left (HCC)     PAST SURGICAL HISTORY: Past Surgical History:  Procedure Laterality Date   ABDOMINAL HYSTERECTOMY     BILATERAL OOPHORECTOMY  1999   BLADDER INSTILLATION N/A 04/01/2023   Procedure: BLADDER INSTILLATION OF GEMCITABINE;  Surgeon: Vanna Scotland, MD;  Location: [facility];  Service: Urology;  Laterality: N/A;   BLADDER INSTILLATION N/A 07/01/2023   Procedure: BLADDER INSTILLATION OF GEMCITABINE;  Surgeon: Vanna Scotland, MD;  Location: [facility];  Service: Urology;  Laterality: N/A;   CATARACT EXTRACTION W/PHACO Right 05/09/2023   Procedure: CATARACT EXTRACTION PHACO AND INTRAOCULAR LENS PLACEMENT (IOC) RIGHT MALYUGIN OMIDRIA 7.79 00:59.0;  Surgeon: Estanislado Pandy, MD;  Location: [facility];  Service: Ophthalmology;   Laterality: Right;   CATARACT EXTRACTION W/PHACO Left 05/23/2023   Procedure: CATARACT EXTRACTION PHACO AND INTRAOCULAR LENS PLACEMENT (IOC) LEFT MALYUGIN OMIDRIA 12.13 1:23.6;  Surgeon: Estanislado Pandy, MD;  Location: [facility];  Service: Ophthalmology;  Laterality: Left;   CESAREAN SECTION     COLONOSCOPY  09/02/2020   COLONOSCOPY WITH PROPOFOL N/A 05/09/2015   Procedure: COLONOSCOPY WITH PROPOFOL;  Surgeon: Elnita Maxwell, MD;  Location: [facility];  Service: Endoscopy;  Laterality: N/A;   CYSTOSCOPY W/ RETROGRADES Right 07/01/2023   Procedure: CYSTOSCOPY WITH RETROGRADE PYELOGRAM;  Surgeon: Vanna Scotland, MD;  Location: [facility];  Service: Urology;  Laterality: Right;   CYSTOSCOPY WITH RETROGRADE PYELOGRAM, URETEROSCOPY AND STENT PLACEMENT Left 10/09/2022   Procedure: CYSTOSCOPY WITH RETROGRADE PYELOGRAM, URETEROSCOPY AND STENT PLACEMENT;  Surgeon: Riki Altes, MD;  Location: [facility];  Service: Urology;  Laterality: Left;   IR IMAGING GUIDED PORT INSERTION  12/06/2022   MELANOMA EXCISION Right 07/29/2018   lateral neck inf. to right angle of mandible   OOPHORECTOMY     ROBOT ASSITED LAPAROSCOPIC NEPHROURETERECTOMY Left 04/01/2023   Procedure: XI ROBOT ASSITED LAPAROSCOPIC NEPHROURETERECTOMY;  Surgeon: Vanna Scotland, MD;  Location: [facility];  Service: Urology;  Laterality: Left;   TRANSURETHRAL RESECTION OF BLADDER TUMOR N/A 07/01/2023   Procedure: TRANSURETHRAL RESECTION OF BLADDER TUMOR (TURBT);  Surgeon: Vanna Scotland, MD;  Location: [facility];  Service: Urology;  Laterality: N/A;   URETERAL BIOPSY Left 10/09/2022   Procedure: URETERAL BIOPSY;  Surgeon: Riki Altes, MD;  Location: [facility];  Service: Urology;  Laterality: Left;    FAMILY HISTORY: Family History  Problem Relation Age of Onset   Emphysema Father    Cancer Neg Hx    Heart disease Neg Hx    Diabetes Neg Hx    Breast cancer Neg Hx     ADVANCED DIRECTIVES (Y/N):  N  HEALTH  MAINTENANCE: Social History   Tobacco Use   Smoking status: Never    Passive exposure: Never   Smokeless tobacco: Never  Vaping Use   Vaping status: Never Used  Substance Use Topics   Alcohol use: No   Drug use: No     Colonoscopy:  PAP:  Bone density:  Lipid panel:  Allergies  Allergen Reactions   Codeine Nausea And Vomiting   Neosporin Wound Cleanser [Benzalkonium Chloride] Other (See Comments)    Skin blisters   Penicillins Hives    Current Outpatient Medications  Medication Sig Dispense Refill   acetaminophen (TYLENOL) 325 MG tablet Take 650 mg by mouth every 6 (six) hours as needed.     calcium-vitamin D (OSCAL WITH D) 500-200 MG-UNIT tablet 1 tablet daily with breakfast.     Cholecalciferol 25 MCG (1000 UT) tablet Take 1,000 Units by mouth daily.     cyanocobalamin 1000 MCG tablet Take 1,000 mcg by mouth daily.     desvenlafaxine (PRISTIQ) 25 MG 24 hr tablet Take 1 tablet by mouth daily.     donepezil (ARICEPT) 10 MG tablet TAKE 1  TABLET BY MOUTH ONCE EVERY EVENING     lidocaine-prilocaine (EMLA) cream Apply to affected area once 30 g 3   memantine (NAMENDA) 5 MG tablet Take 5 mg by mouth in the morning and at bedtime.     omeprazole (PRILOSEC) 40 MG capsule 40 mg daily.     ondansetron (ZOFRAN) 8 MG tablet Take 1 tablet (8 mg total) by mouth every 8 (eight) hours as needed for nausea or vomiting. Start on the third day after cisplatin. 60 tablet 2   oxybutynin (DITROPAN) 5 MG tablet Take 1 tablet (5 mg total) by mouth every 8 (eight) hours as needed for bladder spasms. 30 tablet 0   prochlorperazine (COMPAZINE) 10 MG tablet Take 1 tablet (10 mg total) by mouth every 6 (six) hours as needed (Nausea or vomiting). 60 tablet 2   raloxifene (EVISTA) 60 MG tablet TAKE ONE TABLET BY MOUTH ONCE A DAY 90 tablet 0   rosuvastatin (CRESTOR) 5 MG tablet Take 5 mg by mouth daily.     No current facility-administered medications for this visit.    OBJECTIVE: Vitals:    08/09/23 0920  BP: 133/73  Pulse: 69  Resp: 16  Temp: 98 F (36.7 C)  SpO2: 100%     Body mass index is 20.88 kg/m.    ECOG FS:1 - Symptomatic but completely ambulatory  General: Well-developed, well-nourished, no acute distress. Eyes: Pink conjunctiva, anicteric sclera. HEENT: Normocephalic, moist mucous membranes. Lungs: No audible wheezing or coughing. Heart: Regular rate and rhythm. Abdomen: Soft, nontender, no obvious distention. Musculoskeletal: No edema, cyanosis, or clubbing. Neuro: Alert, answering all questions appropriately. Cranial nerves grossly intact. Skin: No rashes or petechiae noted. Psych: Normal affect.  LAB RESULTS:  Lab Results  Component Value Date   NA 134 (L) 07/31/2023   K 4.4 07/31/2023   CL 104 07/31/2023   CO2 21 (L) 07/31/2023   GLUCOSE 93 07/31/2023   BUN 40 (H) 07/31/2023   CREATININE 2.07 (H) 07/31/2023   CALCIUM 8.9 07/31/2023   PROT 6.4 (L) 07/31/2023   ALBUMIN 4.0 07/31/2023   AST 17 07/31/2023   ALT 11 07/31/2023   ALKPHOS 36 (L) 07/31/2023   BILITOT 0.7 07/31/2023   GFRNONAA 24 (L) 07/31/2023    Lab Results  Component Value Date   WBC 8.0 07/31/2023   NEUTROABS 5.3 07/31/2023   HGB 9.9 (L) 07/31/2023   HCT 30.6 (L) 07/31/2023   MCV 88.7 07/31/2023   PLT 106 (L) 07/31/2023     STUDIES: CT ABDOMEN PELVIS WO CONTRAST  Result Date: 08/08/2023 CLINICAL DATA:  Urothelial carcinoma. Multiple bladder cancers. * Tracking Code: BO * EXAM: CT ABDOMEN AND PELVIS WITHOUT CONTRAST TECHNIQUE: Multidetector CT imaging of the abdomen and pelvis was performed following the standard protocol without IV contrast. RADIATION DOSE REDUCTION: This exam was performed according to the departmental dose-optimization program which includes automated exposure control, adjustment of the mA and/or kV according to patient size and/or use of iterative reconstruction technique. COMPARISON:  11/13/2022 PET.  Abdominopelvic CT of 09/25/2022 FINDINGS: Lower  chest: Clear lung bases. Normal heart size without pericardial or pleural effusion. Incompletely imaged central line. Hepatobiliary: Normal liver. There may be tiny dependent gallstones. No acute cholecystitis or biliary duct dilatation. Pancreas: Normal, without mass or ductal dilatation. Spleen: Normal in size, without focal abnormality. Adrenals/Urinary Tract: Normal adrenal glands. No right renal calculi or hydronephrosis. Interval left nephrectomy. No noncontrast evidence of bladder mass. Stomach/Bowel: Normal stomach, without wall thickening. Scattered colonic diverticula. Normal terminal  ileum. Normal small bowel. Vascular/Lymphatic: Aortic atherosclerosis. No abdominopelvic adenopathy. Reproductive: Hysterectomy.  No adnexal mass. Other: No significant free fluid. Again identified is a right pelvic dominant 3.6 cm calcification which can be considered benign. Musculoskeletal: Osteopenia. Convex right thoracolumbar spine curvature. IMPRESSION: 1. Interval left nephrectomy, without residual or recurrent disease. 2. Decreased sensitivity for metastatic disease secondary to noncontrast technique. No metastasis identified. 3. Incidental findings, including: Coronary artery atherosclerosis. Aortic Atherosclerosis (ICD10-I70.0). Possible cholelithiasis. Electronically Signed   By: Jeronimo Greaves M.D.   On: 08/08/2023 16:25    ASSESSMENT: Urothelial carcinoma of the kidney.  PLAN:    Urothelial carcinoma of the kidney:  Patient completed 4 cycles of neoadjuvant cisplatin and gemcitabine on Feb 19, 2023.  She underwent complete R0 surgical resection on April 01, 2023.  Repeat noncontrast CT scan on August 08, 2023 reviewed independently and report as above with no obvious evidence of recurrent or metastatic disease.  Patient was noted to have several noninvasive lesions in her bladder and plans to start BCG treatment in the near future.  If patient had recurrent disease or invasive lesions, would consider systemic  immunotherapy therapy.  Return to clinic in 3 months with repeat laboratory work, imaging, and further evaluation.    Memory loss: Chronic and unchanged.  Continue follow-up with neurology as indicated.   Renal insufficiency: Chronic and unchanged.  Patient's GFR is 24 today.  Will need to be above 30 to use contrast.   Hypokalemia: Resolved. Anemia: Chronic and unchanged.  Patient's hemoglobin is 9.9 today. Thrombocytopenia: Platelets and slightly to 106.  Monitor.  Patient expressed understanding and was in agreement with this plan. She also understands that She can call clinic at any time with any questions, concerns, or complaints.    Cancer Staging  Urothelial carcinoma of kidney [facility]) Staging form: Kidney, AJCC 8th Edition - Clinical stage from 12/01/2022: Stage Unknown (cTX, cN0, cM0) - Signed by Jeralyn Ruths, MD on 12/01/2022 Stage prefix: Initial diagnosis Histologic grade (G): G3 Histologic grading system: 4 grade system   Jeralyn Ruths, MD   08/09/2023 9:55 AM

## 2023-08-10 ENCOUNTER — Other Ambulatory Visit: Payer: Self-pay

## 2023-08-21 NOTE — Progress Notes (Signed)
BCG Bladder Instillation  BCG # 1/6  Due to Bladder Cancer patient is present today for a BCG treatment. Patient was cleaned and prepped in a sterile fashion with betadine. A 14 FR catheter was inserted, urine return was noted 20 ml, urine was yellow clear  in color.  50ml of reconstituted BCG was instilled into the bladder. The catheter was then removed. Patient tolerated well, no complications were noted  Performed by: Michiel Cowboy, PA-C and Humberta Magallon Mariche, CMA   Follow up/ Additional notes: Return in 1 week for number 2 out of 6 BCG

## 2023-08-22 ENCOUNTER — Ambulatory Visit: Payer: PPO | Admitting: Dermatology

## 2023-08-22 ENCOUNTER — Encounter: Payer: Self-pay | Admitting: Dermatology

## 2023-08-22 ENCOUNTER — Ambulatory Visit: Payer: PPO | Admitting: Urology

## 2023-08-22 DIAGNOSIS — Z86018 Personal history of other benign neoplasm: Secondary | ICD-10-CM

## 2023-08-22 DIAGNOSIS — D1801 Hemangioma of skin and subcutaneous tissue: Secondary | ICD-10-CM | POA: Diagnosis not present

## 2023-08-22 DIAGNOSIS — L820 Inflamed seborrheic keratosis: Secondary | ICD-10-CM

## 2023-08-22 DIAGNOSIS — L814 Other melanin hyperpigmentation: Secondary | ICD-10-CM | POA: Diagnosis not present

## 2023-08-22 DIAGNOSIS — L821 Other seborrheic keratosis: Secondary | ICD-10-CM

## 2023-08-22 DIAGNOSIS — W908XXA Exposure to other nonionizing radiation, initial encounter: Secondary | ICD-10-CM

## 2023-08-22 DIAGNOSIS — Z1283 Encounter for screening for malignant neoplasm of skin: Secondary | ICD-10-CM

## 2023-08-22 DIAGNOSIS — D229 Melanocytic nevi, unspecified: Secondary | ICD-10-CM

## 2023-08-22 DIAGNOSIS — L578 Other skin changes due to chronic exposure to nonionizing radiation: Secondary | ICD-10-CM | POA: Diagnosis not present

## 2023-08-22 DIAGNOSIS — Z85820 Personal history of malignant melanoma of skin: Secondary | ICD-10-CM

## 2023-08-22 DIAGNOSIS — Z85828 Personal history of other malignant neoplasm of skin: Secondary | ICD-10-CM | POA: Diagnosis not present

## 2023-08-22 DIAGNOSIS — Z86006 Personal history of melanoma in-situ: Secondary | ICD-10-CM

## 2023-08-22 NOTE — Progress Notes (Signed)
Follow-Up Visit   Subjective  Jessica Morrison is a 77 y.o. female who presents for the following: Skin Cancer Screening and Full Body Skin Exam. HxMIS, HxDN, HxBCC. Dx with kidney cancer of left kidney 03/2023.   The patient presents for Total-Body Skin Exam (TBSE) for skin cancer screening and mole check. The patient has spots, moles and lesions to be evaluated, some may be new or changing and the patient may have concern these could be cancer.    The following portions of the chart were reviewed this encounter and updated as appropriate: medications, allergies, medical history  Review of Systems:  No other skin or systemic complaints except as noted in HPI or Assessment and Plan.  Objective  Well appearing patient in no apparent distress; mood and affect are within normal limits.  A full examination was performed including scalp, head, eyes, ears, nose, lips, neck, chest, axillae, abdomen, back, buttocks, bilateral upper extremities, bilateral lower extremities, hands, feet, fingers, toes, fingernails, and toenails. All findings within normal limits unless otherwise noted below.   Relevant physical exam findings are noted in the Assessment and Plan.  R forehead x1 Erythematous keratotic or waxy stuck-on papule or plaque.    Assessment & Plan   HISTORY OF MELANOMA IN SITU. Right lat neck inf to right angle of mandible, excised: 07/29/2018, margins free  - No evidence of recurrence today - No lymphadenopathy  - Recommend regular full body skin exams - Recommend daily broad spectrum sunscreen SPF 30+ to sun-exposed areas, reapply every 2 hours as needed.  - Call if any new or changing lesions are noted between office visits   HISTORY OF BASAL CELL CARCINOMA OF THE SKIN - No evidence of recurrence today - Recommend regular full body skin exams - Recommend daily broad spectrum sunscreen SPF 30+ to sun-exposed areas, reapply every 2 hours as needed.  - Call if any new or  changing lesions are noted between office visits   HISTORY OF DYSPLASTIC NEVI No evidence of recurrence today Recommend regular full body skin exams Recommend daily broad spectrum sunscreen SPF 30+ to sun-exposed areas, reapply every 2 hours as needed.  Call if any new or changing lesions are noted between office visits   SKIN CANCER SCREENING PERFORMED TODAY.  ACTINIC DAMAGE - Chronic condition, secondary to cumulative UV/sun exposure - diffuse scaly erythematous macules with underlying dyspigmentation - Recommend daily broad spectrum sunscreen SPF 30+ to sun-exposed areas, reapply every 2 hours as needed.  - Staying in the shade or wearing long sleeves, sun glasses (UVA+UVB protection) and wide brim hats (4-inch brim around the entire circumference of the hat) are also recommended for sun protection.  - Call for new or changing lesions.  LENTIGINES, SEBORRHEIC KERATOSES, HEMANGIOMAS - Benign normal skin lesions - Benign-appearing - Call for any changes  MELANOCYTIC NEVI - Tan-brown and/or pink-flesh-colored symmetric macules and papules - Benign appearing on exam today - Observation - Call clinic for new or changing moles - Recommend daily use of broad spectrum spf 30+ sunscreen to sun-exposed areas.       Inflamed seborrheic keratosis R forehead x1  Symptomatic, irritating, patient would like treated.  Destruction of lesion - R forehead x1 Complexity: simple   Destruction method: cryotherapy   Informed consent: discussed and consent obtained   Timeout:  patient name, date of birth, surgical site, and procedure verified Lesion destroyed using liquid nitrogen: Yes   Region frozen until ice ball extended beyond lesion: Yes   Outcome: patient  tolerated procedure well with no complications   Post-procedure details: wound care instructions given   Additional details:  Prior to procedure, discussed risks of blister formation, small wound, skin dyspigmentation, or rare scar  following cryotherapy. Recommend Vaseline ointment to treated areas while healing.    Return in about 6 months (around 02/19/2024) for TBSE, HxMIS, HxBCC, HxDN.  I, Lawson Radar, CMA, am acting as scribe for Armida Sans, MD.   Documentation: I have reviewed the above documentation for accuracy and completeness, and I agree with the above.  Armida Sans, MD

## 2023-08-22 NOTE — Patient Instructions (Signed)

## 2023-08-23 ENCOUNTER — Ambulatory Visit: Payer: PPO | Admitting: Urology

## 2023-08-23 ENCOUNTER — Other Ambulatory Visit: Payer: Self-pay

## 2023-08-23 ENCOUNTER — Encounter: Payer: Self-pay | Admitting: Urology

## 2023-08-23 VITALS — BP 136/72 | HR 82

## 2023-08-23 DIAGNOSIS — C689 Malignant neoplasm of urinary organ, unspecified: Secondary | ICD-10-CM | POA: Diagnosis not present

## 2023-08-23 LAB — MICROSCOPIC EXAMINATION

## 2023-08-23 LAB — URINALYSIS, COMPLETE
Bilirubin, UA: NEGATIVE
Glucose, UA: NEGATIVE
Nitrite, UA: NEGATIVE
Protein,UA: NEGATIVE
Specific Gravity, UA: 1.025 (ref 1.005–1.030)
Urobilinogen, Ur: 0.2 mg/dL (ref 0.2–1.0)
pH, UA: 5 (ref 5.0–7.5)

## 2023-08-23 MED ORDER — BCG LIVE 50 MG IS SUSR
3.2400 mL | Freq: Once | INTRAVESICAL | Status: AC
Start: 2023-08-23 — End: 2023-08-23
  Administered 2023-08-23: 81 mg via INTRAVESICAL

## 2023-08-23 NOTE — Addendum Note (Signed)
Addended byRanda Lynn on: 08/23/2023 10:53 AM   Modules accepted: Orders

## 2023-08-23 NOTE — Patient Instructions (Signed)

## 2023-08-27 NOTE — Progress Notes (Unsigned)
BCG Bladder Instillation  BCG # 2/6  Due to Bladder Cancer patient is present today for a BCG treatment. Patient was cleaned and prepped in a sterile fashion with betadine. A 14 FR catheter was inserted, urine return was noted 20 ml, urine was yellow  in color.  50ml of reconstituted BCG was instilled into the bladder. The catheter was then removed. Patient tolerated well, no complications were noted  Performed by: Michiel Cowboy, PA-C and Humberta Magallon Marichie, CMA   Follow up/ Additional notes: Follow up in one week for # 3/6

## 2023-08-29 ENCOUNTER — Ambulatory Visit: Payer: PPO | Admitting: Urology

## 2023-08-29 ENCOUNTER — Encounter: Payer: Self-pay | Admitting: Urology

## 2023-08-29 VITALS — BP 149/78 | HR 80

## 2023-08-29 DIAGNOSIS — C689 Malignant neoplasm of urinary organ, unspecified: Secondary | ICD-10-CM

## 2023-08-29 LAB — MICROSCOPIC EXAMINATION: WBC, UA: >30 /[HPF] — AB (ref 0–5)

## 2023-08-29 LAB — URINALYSIS, COMPLETE
Bilirubin, UA: NEGATIVE
Glucose, UA: NEGATIVE
Ketones, UA: NEGATIVE
Nitrite, UA: POSITIVE — AB
Protein,UA: NEGATIVE
Specific Gravity, UA: 1.025 (ref 1.005–1.030)
Urobilinogen, Ur: 0.2 mg/dL (ref 0.2–1.0)
pH, UA: 5 (ref 5.0–7.5)

## 2023-08-29 MED ORDER — BCG LIVE 50 MG IS SUSR
3.2400 mL | Freq: Once | INTRAVESICAL | Status: AC
Start: 2023-08-29 — End: 2023-08-29
  Administered 2023-08-29: 81 mg via INTRAVESICAL

## 2023-09-02 LAB — CULTURE, URINE COMPREHENSIVE

## 2023-09-03 NOTE — Progress Notes (Unsigned)
BCG Bladder Instillation  BCG # 3/6  Due to Bladder Cancer patient is present today for a BCG treatment. Patient was cleaned and prepped in a sterile fashion with betadine. A 14 FR catheter was inserted, urine return was noted 2 ml, urine was yellow in color.  50ml of reconstituted BCG was instilled into the bladder. The catheter was then removed. Patient tolerated well, no complications were noted  Performed by: Michiel Cowboy, PA-C and Humberta Magallon-Mariche, CMA   Follow up/ Additional notes: Follow-up in 1 week for number 4 out of 6 BCG  Advised patient and her daughter about the positive urine culture in her chart and explained that we do not treat unless she is having symptoms of UTI which she denies.

## 2023-09-05 ENCOUNTER — Ambulatory Visit: Payer: PPO | Admitting: Urology

## 2023-09-05 DIAGNOSIS — C689 Malignant neoplasm of urinary organ, unspecified: Secondary | ICD-10-CM

## 2023-09-05 DIAGNOSIS — C679 Malignant neoplasm of bladder, unspecified: Secondary | ICD-10-CM | POA: Diagnosis not present

## 2023-09-05 LAB — URINALYSIS, COMPLETE
Bilirubin, UA: NEGATIVE
Glucose, UA: NEGATIVE
Ketones, UA: NEGATIVE
Nitrite, UA: POSITIVE — AB
Protein,UA: NEGATIVE
Specific Gravity, UA: 1.025 (ref 1.005–1.030)
Urobilinogen, Ur: 0.2 mg/dL (ref 0.2–1.0)
pH, UA: 5 (ref 5.0–7.5)

## 2023-09-05 LAB — MICROSCOPIC EXAMINATION: WBC, UA: >30 /[HPF] — AB (ref 0–5)

## 2023-09-05 MED ORDER — BCG LIVE 50 MG IS SUSR
3.2400 mL | Freq: Once | INTRAVESICAL | Status: AC
Start: 2023-09-05 — End: 2023-09-05
  Administered 2023-09-05: 81 mg via INTRAVESICAL

## 2023-09-05 NOTE — Progress Notes (Signed)
BCG Bladder Instillation  BCG # 4/6  Due to Bladder Cancer patient is present today for a BCG treatment. Patient was cleaned and prepped in a sterile fashion with betadine. A 14 FR catheter was inserted, urine return was noted 50 ml, urine was yellow in color.  50ml of reconstituted BCG was instilled into the bladder. The catheter was then removed. Patient tolerated well, no complications were noted  Performed by: Michiel Cowboy, PA-C and Humberta  Mariche-Magallon, CMA   Follow up/ Additional notes: Follow-up in 1 week for number 5 out of 6 BCG

## 2023-09-11 ENCOUNTER — Ambulatory Visit: Payer: PPO | Admitting: Urology

## 2023-09-11 ENCOUNTER — Encounter: Payer: Self-pay | Admitting: Urology

## 2023-09-11 VITALS — BP 154/76 | HR 93

## 2023-09-11 DIAGNOSIS — C679 Malignant neoplasm of bladder, unspecified: Secondary | ICD-10-CM | POA: Diagnosis not present

## 2023-09-11 DIAGNOSIS — C689 Malignant neoplasm of urinary organ, unspecified: Secondary | ICD-10-CM | POA: Diagnosis not present

## 2023-09-11 LAB — URINALYSIS, COMPLETE
Bilirubin, UA: NEGATIVE
Glucose, UA: NEGATIVE
Ketones, UA: NEGATIVE
Nitrite, UA: POSITIVE — AB
Specific Gravity, UA: 1.025 (ref 1.005–1.030)
Urobilinogen, Ur: 0.2 mg/dL (ref 0.2–1.0)
pH, UA: 5.5 (ref 5.0–7.5)

## 2023-09-11 LAB — MICROSCOPIC EXAMINATION: WBC, UA: >30 /[HPF] — AB (ref 0–5)

## 2023-09-11 MED ORDER — BCG LIVE 50 MG IS SUSR
3.2400 mL | Freq: Once | INTRAVESICAL | Status: AC
Start: 2023-09-11 — End: 2023-09-11
  Administered 2023-09-11: 81 mg via INTRAVESICAL

## 2023-09-16 NOTE — Progress Notes (Unsigned)
BCG Bladder Instillation  BCG # 5/6  Due to Bladder Cancer patient is present today for a BCG treatment. Patient was cleaned and prepped in a sterile fashion with betadine. A 14 FR catheter was inserted, urine return was noted 50 ml, urine was yellowclear in color.  50ml of reconstituted BCG was instilled into the bladder. The catheter was then removed. Patient tolerated well, no complications were noted  Performed by: Michiel Cowboy, PA-C and Anastaysia Hopkins, CMA   Follow up/ Additional notes: 1 week for number 6 out of 6 BCG

## 2023-09-18 ENCOUNTER — Ambulatory Visit (INDEPENDENT_AMBULATORY_CARE_PROVIDER_SITE_OTHER): Payer: PPO | Admitting: Obstetrics and Gynecology

## 2023-09-18 ENCOUNTER — Encounter: Payer: Self-pay | Admitting: Obstetrics and Gynecology

## 2023-09-18 VITALS — BP 152/78 | HR 73 | Ht <= 58 in | Wt 99.1 lb

## 2023-09-18 DIAGNOSIS — Z1239 Encounter for other screening for malignant neoplasm of breast: Secondary | ICD-10-CM

## 2023-09-18 DIAGNOSIS — Z0000 Encounter for general adult medical examination without abnormal findings: Secondary | ICD-10-CM

## 2023-09-18 NOTE — Progress Notes (Signed)
HPI:      [name] is a 77 y.o. G2P1101 who LMP was No LMP recorded. Patient has had a hysterectomy.  Subjective:   She presents today for her annual examination.  She was recently diagnosed with bladder cancer and has undergone treatment for it.  She still has immunotherapy to go but says that she is generally doing well.  She has no vaginal or GYN pelvic complaints.  She would like a breast examination today.    Hx: The following portions of the patient's history were reviewed and updated as appropriate:             She  has a past medical history of B12 deficiency, Back pain, Basal cell carcinoma (08/04/2020), Cancer (HCC), Curvature of spine, Dysplastic nevus (04/30/2013), Dysplastic nevus (05/04/2014), Dysplastic nevus (03/26/2019), Dysplastic nevus (10/29/2019), Dysplastic nevus (03/31/2020, 05/12/20 shave removal), Dysplastic nevus (06/29/2021), Endometriosis, Fibrocystic disease of breast, Fibroid, GERD (gastroesophageal reflux disease), Grade I diastolic dysfunction, History of kidney stones, HLD (hyperlipidemia), Hyperlipemia, Hypertension, Kidney stone, 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 She does not have any pertinent problems on file. She  has a past surgical history that includes Abdominal hysterectomy; Oophorectomy; Colonoscopy with propofol (N/A, 05/09/2015); Cesarean section; Bilateral oophorectomy (1999); Melanoma excision (Right, 07/29/2018); Cystoscopy with retrograde pyelogram, ureteroscopy and stent placement (Left, 10/09/2022); Ureteral biopsy (Left, 10/09/2022); IR IMAGING GUIDED PORT INSERTION (12/06/2022);  Colonoscopy (09/02/2020); Robot assited laparoscopic nephroureterectomy (Left, 04/01/2023); Bladder instillation (N/A, 04/01/2023); Cataract extraction w/PHACO (Right, 05/09/2023); Cataract extraction w/PHACO (Left, 05/23/2023); Cystoscopy w/ retrogrades (Right, 07/01/2023); Transurethral resection of bladder tumor (N/A, 07/01/2023); and Bladder instillation (N/A, 07/01/2023). Her family history includes Emphysema in her father. She  reports that she has never smoked. She has never been exposed to tobacco smoke. She has never used smokeless tobacco. She reports that she does not drink alcohol and does not use drugs. She has a current medication list which includes the following prescription(s): acetaminophen, calcium-vitamin d, cholecalciferol, cyanocobalamin, desvenlafaxine, donepezil, memantine, omeprazole, raloxifene, rosuvastatin, lidocaine-prilocaine, ondansetron, oxybutynin, and prochlorperazine. She is allergic to codeine, neosporin wound cleanser [benzalkonium chloride], and penicillins.       Review of Systems:  Review of Systems  Constitutional: Denied constitutional symptoms, night sweats, recent illness, fatigue, fever, insomnia and weight loss.  Eyes: Denied eye symptoms, eye pain, photophobia, vision change and visual disturbance.  Ears/Nose/Throat/Neck: Denied ear, nose, throat or neck symptoms, hearing loss, nasal discharge, sinus congestion and sore throat.  Cardiovascular: Denied cardiovascular symptoms, arrhythmia, chest pain/pressure, edema, exercise intolerance, orthopnea and palpitations.  Respiratory: Denied pulmonary symptoms, asthma, pleuritic pain, productive sputum, cough, dyspnea and wheezing.  Gastrointestinal: Denied, gastro-esophageal reflux, melena, nausea and vomiting.  Genitourinary: Denied genitourinary symptoms including symptomatic vaginal discharge, pelvic relaxation issues, and urinary complaints.  Musculoskeletal: Denied musculoskeletal symptoms, stiffness, swelling,  muscle weakness and myalgia.  Dermatologic: Denied dermatology symptoms, rash and scar.  Neurologic: Denied neurology symptoms, dizziness, headache, neck pain and syncope.  Psychiatric: Denied psychiatric symptoms, anxiety and depression.  Endocrine: Denied endocrine symptoms including hot flashes and night sweats.   Meds:   Current Outpatient Medications on File Prior to Visit  Medication Sig Dispense Refill   acetaminophen (TYLENOL) 325 MG tablet Take 650 mg by mouth every 6 (six) hours as  needed.     calcium-vitamin D (OSCAL WITH D) 500-200 MG-UNIT tablet 1 tablet daily with breakfast.     Cholecalciferol 25 MCG (1000 UT) tablet Take 1,000 Units by mouth daily.     cyanocobalamin 1000 MCG tablet Take 1,000 mcg by mouth daily.     desvenlafaxine (PRISTIQ) 25 MG 24 hr tablet Take 1 tablet by mouth daily.     donepezil (ARICEPT) 10 MG tablet TAKE 1 TABLET BY MOUTH ONCE EVERY EVENING     memantine (NAMENDA) 5 MG tablet Take 5 mg by mouth in the morning and at bedtime.     omeprazole (PRILOSEC) 40 MG capsule 40 mg daily.     raloxifene (EVISTA) 60 MG tablet TAKE ONE TABLET BY MOUTH ONCE A DAY 90 tablet 0   rosuvastatin (CRESTOR) 5 MG tablet Take 5 mg by mouth daily.     lidocaine-prilocaine (EMLA) cream Apply to affected area once (Patient not taking: Reported on 09/18/2023) 30 g 3   ondansetron (ZOFRAN) 8 MG tablet Take 1 tablet (8 mg total) by mouth every 8 (eight) hours as needed for nausea or vomiting. Start on the third day after cisplatin. (Patient not taking: Reported on 09/18/2023) 60 tablet 2   oxybutynin (DITROPAN) 5 MG tablet Take 1 tablet (5 mg total) by mouth every 8 (eight) hours as needed for bladder spasms. (Patient not taking: Reported on 09/18/2023) 30 tablet 0   prochlorperazine (COMPAZINE) 10 MG tablet Take 1 tablet (10 mg total) by mouth every 6 (six) hours as needed (Nausea or vomiting). (Patient not taking: Reported on 09/18/2023) 60 tablet 2   No current  facility-administered medications on file prior to visit.     Objective:     Vitals:   09/18/23 0902 09/18/23 0922  BP: (!) 163/82 (!) 152/78  Pulse: 73     Filed Weights   09/18/23 0902  Weight: 99 lb 1.6 oz (45 kg)              Physical examination General NAD, Conversant  HEENT Atraumatic; Op clear with mmm.  Normo-cephalic.  Anicteric sclerae  Thyroid/Neck Smooth without nodularity or enlargement. Normal ROM.  Neck Supple.  Skin No rashes, lesions or ulceration. Normal palpated skin turgor. No nodularity.  Breasts: No masses or discharge.  Symmetric.  No axillary adenopathy.  Lungs: Clear to auscultation.No rales or wheezes. Normal Respiratory effort, no retractions.  Heart: NSR.  No murmurs or rubs appreciated. No peripheral edema  Abdomen: Soft.  Non-tender.  No masses.  No HSM. No hernia  Extremities: Moves all appropriately.  Normal ROM for age. No lymphadenopathy.  Neuro: Oriented to PPT.  Normal mood. Normal affect.     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 Encounter  for annual physical exam     Normal exam today   Plan:            1.  Basic Screening Recommendations The basic  screening recommendations for asymptomatic women were discussed with the patient during her visit.  The age-appropriate recommendations were discussed with her and the rational for the tests reviewed.  When I am informed by the patient that another primary care physician has previously obtained the age-appropriate tests and they are up-to-date, only outstanding tests are ordered and referrals given as necessary.  Abnormal results of tests will be discussed with her when all of her results are completed.  Routine preventative health maintenance measures emphasized: Exercise/Diet/Weight control, Tobacco Warnings, Alcohol/Substance use risks and Stress Management Encouraged patient to continue with her immunotherapy for bladder cancer.  Orders No orders of the defined types were placed in this encounter.   No orders of the defined types were placed in this encounter.        F/U  Return in about 1 year (around 09/17/2024) for Annual Physical.  Elonda Husky, M.D. 09/18/2023 9:59 AM

## 2023-09-18 NOTE — Progress Notes (Signed)
Patients presents for annual exam today. She states going through a lot the last year, diagnosed with bladder cancer along with multiple surgeries.History of hysterectomy. She states no other questions or concerns at this time.

## 2023-09-19 ENCOUNTER — Other Ambulatory Visit: Payer: Self-pay | Admitting: Obstetrics and Gynecology

## 2023-09-19 ENCOUNTER — Ambulatory Visit: Payer: PPO | Admitting: Urology

## 2023-09-19 VITALS — BP 145/75 | HR 80

## 2023-09-19 DIAGNOSIS — N390 Urinary tract infection, site not specified: Secondary | ICD-10-CM

## 2023-09-19 DIAGNOSIS — C689 Malignant neoplasm of urinary organ, unspecified: Secondary | ICD-10-CM

## 2023-09-19 DIAGNOSIS — Z01419 Encounter for gynecological examination (general) (routine) without abnormal findings: Secondary | ICD-10-CM

## 2023-09-19 LAB — MICROSCOPIC EXAMINATION

## 2023-09-19 LAB — URINALYSIS, COMPLETE
Bilirubin, UA: NEGATIVE
Glucose, UA: NEGATIVE
Ketones, UA: NEGATIVE
Leukocytes,UA: NEGATIVE
Nitrite, UA: POSITIVE — AB
Specific Gravity, UA: 1.025 (ref 1.005–1.030)
Urobilinogen, Ur: 0.2 mg/dL (ref 0.2–1.0)
pH, UA: 5 (ref 5.0–7.5)

## 2023-09-19 MED ORDER — BCG LIVE 50 MG IS SUSR
3.2400 mL | Freq: Once | INTRAVESICAL | Status: AC
Start: 2023-09-19 — End: 2023-09-19
  Administered 2023-09-19: 81 mg via INTRAVESICAL

## 2023-09-23 NOTE — Progress Notes (Unsigned)
BCG Bladder Instillation  BCG # 6/6  Due to Bladder Cancer patient is present today for a BCG treatment. Patient was cleaned and prepped in a sterile fashion with betadine. A 14 FR catheter was inserted, urine return was noted 5 ml, urine was yellow in color.  50ml of reconstituted BCG was instilled into the bladder. The catheter was then removed. Patient tolerated well, no complications were noted  Performed by: Michiel Cowboy, PA-C and Humberta Mariche-Magallon, CMA  Follow up/ Additional notes: Follow-up in 3 months with surveillance cystoscopy with Dr. Apolinar Junes.

## 2023-09-26 ENCOUNTER — Ambulatory Visit: Payer: PPO | Admitting: Urology

## 2023-09-26 DIAGNOSIS — C689 Malignant neoplasm of urinary organ, unspecified: Secondary | ICD-10-CM | POA: Diagnosis not present

## 2023-09-26 DIAGNOSIS — C679 Malignant neoplasm of bladder, unspecified: Secondary | ICD-10-CM

## 2023-09-26 LAB — URINALYSIS, COMPLETE
Bilirubin, UA: NEGATIVE
Glucose, UA: NEGATIVE
Leukocytes,UA: NEGATIVE
Nitrite, UA: POSITIVE — AB
Specific Gravity, UA: >1.03 — ABNORMAL HIGH (ref 1.005–1.030)
Urobilinogen, Ur: 0.2 mg/dL (ref 0.2–1.0)
pH, UA: 5.5 (ref 5.0–7.5)

## 2023-09-26 LAB — MICROSCOPIC EXAMINATION

## 2023-09-26 MED ORDER — BCG LIVE 50 MG IS SUSR
3.2400 mL | Freq: Once | INTRAVESICAL | Status: AC
  Administered 2023-09-26: 81 mg via INTRAVESICAL

## 2023-10-01 ENCOUNTER — Other Ambulatory Visit: Payer: PPO | Admitting: Urology

## 2023-10-01 DIAGNOSIS — E538 Deficiency of other specified B group vitamins: Secondary | ICD-10-CM | POA: Diagnosis not present

## 2023-10-01 DIAGNOSIS — R7303 Prediabetes: Secondary | ICD-10-CM | POA: Diagnosis not present

## 2023-10-01 DIAGNOSIS — Z452 Encounter for adjustment and management of vascular access device: Secondary | ICD-10-CM | POA: Diagnosis not present

## 2023-10-01 DIAGNOSIS — N184 Chronic kidney disease, stage 4 (severe): Secondary | ICD-10-CM | POA: Diagnosis not present

## 2023-10-01 DIAGNOSIS — R413 Other amnesia: Secondary | ICD-10-CM | POA: Diagnosis not present

## 2023-10-01 DIAGNOSIS — I10 Essential (primary) hypertension: Secondary | ICD-10-CM | POA: Diagnosis not present

## 2023-10-01 DIAGNOSIS — E782 Mixed hyperlipidemia: Secondary | ICD-10-CM | POA: Diagnosis not present

## 2023-10-01 DIAGNOSIS — Z79899 Other long term (current) drug therapy: Secondary | ICD-10-CM | POA: Diagnosis not present

## 2023-10-01 DIAGNOSIS — R0602 Shortness of breath: Secondary | ICD-10-CM | POA: Diagnosis not present

## 2023-10-31 DIAGNOSIS — R0602 Shortness of breath: Secondary | ICD-10-CM | POA: Diagnosis not present

## 2023-11-01 DIAGNOSIS — R451 Restlessness and agitation: Secondary | ICD-10-CM | POA: Diagnosis not present

## 2023-11-01 DIAGNOSIS — R519 Headache, unspecified: Secondary | ICD-10-CM | POA: Diagnosis not present

## 2023-11-01 DIAGNOSIS — C689 Malignant neoplasm of urinary organ, unspecified: Secondary | ICD-10-CM | POA: Diagnosis not present

## 2023-11-01 DIAGNOSIS — M545 Low back pain, unspecified: Secondary | ICD-10-CM | POA: Diagnosis not present

## 2023-11-01 DIAGNOSIS — F0390 Unspecified dementia without behavioral disturbance: Secondary | ICD-10-CM | POA: Diagnosis not present

## 2023-11-01 DIAGNOSIS — G3184 Mild cognitive impairment, so stated: Secondary | ICD-10-CM | POA: Diagnosis not present

## 2023-11-01 DIAGNOSIS — Z87898 Personal history of other specified conditions: Secondary | ICD-10-CM | POA: Diagnosis not present

## 2023-11-01 DIAGNOSIS — G8929 Other chronic pain: Secondary | ICD-10-CM | POA: Diagnosis not present

## 2023-11-11 ENCOUNTER — Ambulatory Visit
Admission: RE | Admit: 2023-11-11 | Discharge: 2023-11-11 | Disposition: A | Discharge status: Home / Self Care | Payer: PPO | Source: Ambulatory Visit | Attending: Oncology | Admitting: Oncology

## 2023-11-11 ENCOUNTER — Inpatient Hospital Stay: Payer: PPO | Attending: Oncology

## 2023-11-11 DIAGNOSIS — Z905 Acquired absence of kidney: Secondary | ICD-10-CM | POA: Diagnosis not present

## 2023-11-11 DIAGNOSIS — C642 Malignant neoplasm of left kidney, except renal pelvis: Secondary | ICD-10-CM | POA: Diagnosis not present

## 2023-11-11 DIAGNOSIS — K820 Obstruction of gallbladder: Secondary | ICD-10-CM | POA: Diagnosis not present

## 2023-11-11 DIAGNOSIS — C679 Malignant neoplasm of bladder, unspecified: Secondary | ICD-10-CM | POA: Diagnosis not present

## 2023-11-11 LAB — CMP (CANCER CENTER ONLY)
ALT: 13 U/L (ref 0–44)
AST: 20 U/L (ref 15–41)
Albumin: 3.9 g/dL (ref 3.5–5.0)
Alkaline Phosphatase: 48 U/L (ref 38–126)
Anion gap: 8 (ref 5–15)
BUN: 42 mg/dL — ABNORMAL HIGH (ref 8–23)
CO2: 22 mmol/L (ref 22–32)
Calcium: 8.8 mg/dL — ABNORMAL LOW (ref 8.9–10.3)
Chloride: 108 mmol/L (ref 98–111)
Creatinine: 1.87 mg/dL — ABNORMAL HIGH (ref 0.44–1.00)
GFR, Estimated: 27 mL/min — ABNORMAL LOW (ref >60)
Glucose, Bld: 100 mg/dL — ABNORMAL HIGH (ref 70–99)
Potassium: 5.3 mmol/L — ABNORMAL HIGH (ref 3.5–5.1)
Sodium: 138 mmol/L (ref 135–145)
Total Bilirubin: 0.6 mg/dL (ref 0.0–1.2)
Total Protein: 6.1 g/dL — ABNORMAL LOW (ref 6.5–8.1)

## 2023-11-11 LAB — CBC WITH DIFFERENTIAL/PLATELET
Abs Immature Granulocytes: 0.03 10*3/uL (ref 0.00–0.07)
Basophils Absolute: 0.1 10*3/uL (ref 0.0–0.1)
Basophils Relative: 1 %
Eosinophils Absolute: 0.1 10*3/uL (ref 0.0–0.5)
Eosinophils Relative: 1 %
HCT: 32.3 % — ABNORMAL LOW (ref 36.0–46.0)
Hemoglobin: 10.4 g/dL — ABNORMAL LOW (ref 12.0–15.0)
Immature Granulocytes: 0 %
Lymphocytes Relative: 23 %
Lymphs Abs: 1.8 10*3/uL (ref 0.7–4.0)
MCH: 29.1 pg (ref 26.0–34.0)
MCHC: 32.2 g/dL (ref 30.0–36.0)
MCV: 90.2 fL (ref 80.0–100.0)
Monocytes Absolute: 0.9 10*3/uL (ref 0.1–1.0)
Monocytes Relative: 11 %
Neutro Abs: 4.9 10*3/uL (ref 1.7–7.7)
Neutrophils Relative %: 64 %
Platelets: 142 10*3/uL — ABNORMAL LOW (ref 150–400)
RBC: 3.58 MIL/uL — ABNORMAL LOW (ref 3.87–5.11)
RDW: 12.8 % (ref 11.5–15.5)
WBC: 7.7 10*3/uL (ref 4.0–10.5)
nRBC: 0 % (ref 0.0–0.2)

## 2023-11-11 LAB — MAGNESIUM: Magnesium: 2.2 mg/dL (ref 1.7–2.4)

## 2023-11-11 MED ORDER — HEPARIN SOD (PORK) LOCK FLUSH 100 UNIT/ML IV SOLN
500.0000 [IU] | Freq: Once | INTRAVENOUS | Status: AC
  Administered 2023-11-11: 500 [IU] via INTRAVENOUS
  Filled 2023-11-11: qty 5

## 2023-11-11 MED ORDER — SODIUM CHLORIDE 0.9% FLUSH
10.0000 mL | INTRAVENOUS | Status: DC | PRN
  Administered 2023-11-11: 10 mL via INTRAVENOUS
  Filled 2023-11-11: qty 10

## 2023-11-18 ENCOUNTER — Inpatient Hospital Stay: Payer: PPO | Attending: Oncology | Admitting: Oncology

## 2023-11-18 ENCOUNTER — Encounter: Payer: Self-pay | Admitting: Oncology

## 2023-11-18 VITALS — BP 135/71 | HR 66 | Temp 97.9°F | Resp 16 | Ht <= 58 in | Wt 102.9 lb

## 2023-11-18 DIAGNOSIS — D649 Anemia, unspecified: Secondary | ICD-10-CM | POA: Diagnosis not present

## 2023-11-18 DIAGNOSIS — C642 Malignant neoplasm of left kidney, except renal pelvis: Secondary | ICD-10-CM | POA: Diagnosis not present

## 2023-11-18 DIAGNOSIS — D696 Thrombocytopenia, unspecified: Secondary | ICD-10-CM | POA: Insufficient documentation

## 2023-11-18 NOTE — Progress Notes (Signed)
[facility]  Telephone:([phone]) [phone] [phone]) [phone]  ID: Jessica Morrison OB: 15-Apr-1946  MR#: [id number]  YTK#:[id number]  Patient Care Team: Marguarite Arbour, MD as PCP - General (Internal Medicine)  CHIEF COMPLAINT: Urothelial carcinoma of the kidney.  INTERVAL HISTORY: Patient returns to clinic today for further evaluation and discussion of her imaging results.  She currently feels well.  She has completed her BCG treatments.  She continue to have a poor memory.  She has no neurologic complaints.  She denies any recent fevers or illnesses. She has no chest pain, shortness of breath, cough, or hemoptysis.  She denies any nausea, vomiting, constipation, or diarrhea.  She has no urinary complaints.  Patient offers no specific complaints today.  REVIEW OF SYSTEMS:   Review of Systems  Constitutional: Negative.  Negative for fever, malaise/fatigue and weight loss.  Respiratory: Negative.  Negative for cough and shortness of breath.   Cardiovascular: Negative.  Negative for chest pain and leg swelling.  Gastrointestinal: Negative.  Negative for abdominal pain.  Genitourinary: Negative.  Negative for hematuria.  Musculoskeletal: Negative.  Negative for back pain.  Skin: Negative.  Negative for rash.  Neurological: Negative.  Negative for dizziness, focal weakness, weakness and headaches.  Psychiatric/Behavioral:  Positive for memory loss. The patient is not nervous/anxious.     As per HPI. Otherwise, a complete review of systems is negative.  PAST MEDICAL HISTORY: Past Medical History:  Diagnosis Date   B12 deficiency    Back pain    Basal cell carcinoma 08/04/2020   0.5cm above the left mid brow, EDC 09/15/20   Cancer (HCC)    Melanoma   Curvature of spine    Dysplastic nevus 04/30/2013   left post shoulder, left parasternal   Dysplastic nevus 05/04/2014   right prox ant deltoid, left lat buttock   Dysplastic nevus 03/26/2019   right lat neck adjacent  to the inf top of MM in situ site scar   Dysplastic nevus 10/29/2019   upper back left paraspinal at level of mid scapula   Dysplastic nevus 03/31/2020, 05/12/20 shave removal   Left mid pretibial. Moderate to severe atypia, deep margin involved.   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   right lat neck inf to right angle of mandible, excised: 07/29/2018, margins free   Memory loss    Mild mitral regurgitation by prior echocardiogram    Nephrolithiasis    Osteoarthritis    Osteopenia    Osteoporosis    Pelvic pain in female    PONV (postoperative nausea and vomiting)    Pre-diabetes    Raynaud disease    Renal cancer, left (HCC) 03/2023   Thyromegaly    Transitional cell carcinoma of kidney, left (HCC)    Urothelial carcinoma of kidney, left (HCC)     PAST SURGICAL HISTORY: Past Surgical History:  Procedure Laterality Date   ABDOMINAL HYSTERECTOMY     BILATERAL OOPHORECTOMY  1999   BLADDER INSTILLATION N/A 04/01/2023   Procedure: BLADDER INSTILLATION OF GEMCITABINE;  Surgeon: Vanna Scotland, MD;  Location: [facility];  Service: Urology;  Laterality: N/A;   BLADDER INSTILLATION N/A 07/01/2023   Procedure: BLADDER INSTILLATION OF GEMCITABINE;  Surgeon: Vanna Scotland, MD;  Location: [facility];  Service: Urology;  Laterality: N/A;   CATARACT EXTRACTION W/PHACO Right 05/09/2023   Procedure: CATARACT EXTRACTION PHACO AND INTRAOCULAR LENS PLACEMENT (IOC) RIGHT MALYUGIN OMIDRIA 7.79 00:59.0;  Surgeon: Estanislado Pandy, MD;  Location: [facility];  Service: Ophthalmology;  Laterality: Right;   CATARACT EXTRACTION W/PHACO Left 05/23/2023   Procedure:  CATARACT EXTRACTION PHACO AND INTRAOCULAR LENS PLACEMENT (IOC) LEFT MALYUGIN OMIDRIA 12.13 1:23.6;  Surgeon: Estanislado Pandy, MD;  Location: [facility];  Service: Ophthalmology;  Laterality: Left;   CESAREAN SECTION     COLONOSCOPY  09/02/2020   COLONOSCOPY WITH PROPOFOL N/A 05/09/2015   Procedure: COLONOSCOPY WITH PROPOFOL;  Surgeon: Elnita Maxwell, MD;  Location: [facility];  Service: Endoscopy;  Laterality: N/A;   CYSTOSCOPY W/ RETROGRADES Right 07/01/2023   Procedure: CYSTOSCOPY WITH RETROGRADE PYELOGRAM;  Surgeon: Vanna Scotland, MD;  Location: [facility];  Service: Urology;  Laterality: Right;   CYSTOSCOPY WITH RETROGRADE PYELOGRAM, URETEROSCOPY AND STENT PLACEMENT Left 10/09/2022   Procedure: CYSTOSCOPY WITH RETROGRADE PYELOGRAM, URETEROSCOPY AND STENT PLACEMENT;  Surgeon: Riki Altes, MD;  Location: [facility];  Service: Urology;  Laterality: Left;   IR IMAGING GUIDED PORT INSERTION  12/06/2022   MELANOMA EXCISION Right 07/29/2018   lateral neck inf. to right angle of mandible   OOPHORECTOMY     ROBOT ASSITED LAPAROSCOPIC NEPHROURETERECTOMY Left 04/01/2023   Procedure: XI ROBOT ASSITED LAPAROSCOPIC NEPHROURETERECTOMY;  Surgeon: Vanna Scotland, MD;  Location: [facility];  Service: Urology;  Laterality: Left;   TRANSURETHRAL RESECTION OF BLADDER TUMOR N/A 07/01/2023   Procedure: TRANSURETHRAL RESECTION OF BLADDER TUMOR (TURBT);  Surgeon: Vanna Scotland, MD;  Location: [facility];  Service: Urology;  Laterality: N/A;   URETERAL BIOPSY Left 10/09/2022   Procedure: URETERAL BIOPSY;  Surgeon: Riki Altes, MD;  Location: [facility];  Service: Urology;  Laterality: Left;    FAMILY HISTORY: Family History  Problem Relation Age of Onset   Emphysema Father    Cancer Neg Hx    Heart disease Neg Hx    Diabetes Neg Hx    Breast cancer Neg Hx     ADVANCED DIRECTIVES (Y/N):  N  HEALTH MAINTENANCE: Social History   Tobacco Use   Smoking status: Never    Passive  exposure: Never   Smokeless tobacco: Never  Vaping Use   Vaping status: Never Used  Substance Use Topics   Alcohol use: No   Drug use: No     Colonoscopy:  PAP:  Bone density:  Lipid panel:  Allergies  Allergen Reactions   Codeine Nausea And Vomiting   Neosporin Wound Cleanser [Benzalkonium Chloride] Other (See Comments)    Skin blisters   Penicillins Hives    Current Outpatient Medications  Medication Sig Dispense Refill   acetaminophen (TYLENOL) 325 MG tablet Take 650 mg by mouth every 6 (six) hours as needed.     calcium-vitamin D (OSCAL WITH D) 500-200 MG-UNIT tablet 1 tablet daily with breakfast.     Cholecalciferol 25 MCG (1000 UT) tablet Take 1,000 Units by mouth daily.     cyanocobalamin 1000 MCG tablet Take 1,000 mcg by mouth daily.     desvenlafaxine (PRISTIQ) 25 MG 24 hr tablet Take 1 tablet by mouth daily.     donepezil (ARICEPT) 10 MG tablet TAKE 1 TABLET BY MOUTH ONCE EVERY EVENING     memantine (NAMENDA)  5 MG tablet Take 5 mg by mouth in the morning and at bedtime.     omeprazole (PRILOSEC) 40 MG capsule 40 mg daily.     raloxifene (EVISTA) 60 MG tablet TAKE ONE TABLET BY MOUTH ONCE A DAY 90 tablet 0   rosuvastatin (CRESTOR) 5 MG tablet Take 5 mg by mouth daily.     lidocaine-prilocaine (EMLA) cream Apply to affected area once (Patient not taking: Reported on 11/18/2023) 30 g 3   ondansetron (ZOFRAN) 8 MG tablet Take 1 tablet (8 mg total) by mouth every 8 (eight) hours as needed for nausea or vomiting. Start on the third day after cisplatin. (Patient not taking: Reported on 11/18/2023) 60 tablet 2   oxybutynin (DITROPAN) 5 MG tablet Take 1 tablet (5 mg total) by mouth every 8 (eight) hours as needed for bladder spasms. (Patient not taking: Reported on 11/18/2023) 30 tablet 0   prochlorperazine (COMPAZINE) 10 MG tablet Take 1 tablet (10 mg total) by mouth every 6 (six) hours as needed (Nausea or vomiting). (Patient not taking: Reported on 11/18/2023) 60 tablet 2   No  current facility-administered medications for this visit.    OBJECTIVE: Vitals:   11/18/23 1018  BP: 135/71  Pulse: 66  Resp: 16  Temp: 97.9 F (36.6 C)  SpO2: 93%     Body mass index is 22.27 kg/m.    ECOG FS:1 - Symptomatic but completely ambulatory  General: Well-developed, well-nourished, no acute distress. Eyes: Pink conjunctiva, anicteric sclera. HEENT: Normocephalic, moist mucous membranes. Lungs: No audible wheezing or coughing. Heart: Regular rate and rhythm. Abdomen: Soft, nontender, no obvious distention. Musculoskeletal: No edema, cyanosis, or clubbing. Neuro: Alert, answering all questions appropriately. Cranial nerves grossly intact. Skin: No rashes or petechiae noted. Psych: Normal affect.  LAB RESULTS:  Lab Results  Component Value Date   NA 138 11/11/2023   K 5.3 (H) 11/11/2023   CL 108 11/11/2023   CO2 22 11/11/2023   GLUCOSE 100 (H) 11/11/2023   BUN 42 (H) 11/11/2023   CREATININE 1.87 (H) 11/11/2023   CALCIUM 8.8 (L) 11/11/2023   PROT 6.1 (L) 11/11/2023   ALBUMIN 3.9 11/11/2023   AST 20 11/11/2023   ALT 13 11/11/2023   ALKPHOS 48 11/11/2023   BILITOT 0.6 11/11/2023   GFRNONAA 27 (L) 11/11/2023    Lab Results  Component Value Date   WBC 7.7 11/11/2023   NEUTROABS 4.9 11/11/2023   HGB 10.4 (L) 11/11/2023   HCT 32.3 (L) 11/11/2023   MCV 90.2 11/11/2023   PLT 142 (L) 11/11/2023     STUDIES: No results found.  ASSESSMENT: Urothelial carcinoma of the kidney.  PLAN:    Urothelial carcinoma of the kidney:  Patient completed 4 cycles of neoadjuvant cisplatin and gemcitabine on Feb 19, 2023.  She underwent complete R0 surgical resection on April 01, 2023.  Her most recent CT scan on November 11, 2023 has not yet been read by radiology.  Patient recently underwent BCG treatments for noninvasive lesions in her bladder and has follow-up for repeat cystoscopy with urology next week.  If patient had recurrent invasive disease, would consider systemic  immunotherapy therapy.  No intervention is needed at this time.  Return to clinic in 6 months with repeat laboratory work, imaging, and further evaluation. Memory loss: Chronic and unchanged.  Continue follow-up with neurology as indicated.   Renal insufficiency: Chronic and unchanged.  Patient's most recent GFR was 27.  Will need to be above 30 to use contrast.  Hyperkalemia: Mild, monitor.   Anemia: Hemoglobin mildly improved to 10.4, monitor.   Thrombocytopenia: Platelets improved to 142.   Patient expressed understanding and was in agreement with this plan. She also understands that She can call clinic at any time with any questions, concerns, or complaints.    Cancer Staging  Urothelial carcinoma of kidney [facility]) Staging form: Kidney, AJCC 8th Edition - Clinical stage from 12/01/2022: Stage Unknown (cTX, cN0, cM0) - Signed by Jeralyn Ruths, MD on 12/01/2022 Stage prefix: Initial diagnosis Histologic grade (G): G3 Histologic grading system: 4 grade system   Jeralyn Ruths, MD   11/18/2023 11:12 AM

## 2023-11-19 ENCOUNTER — Telehealth: Payer: Self-pay | Admitting: *Deleted

## 2023-11-19 ENCOUNTER — Other Ambulatory Visit: Payer: Self-pay

## 2023-11-19 NOTE — Telephone Encounter (Signed)
 RN placed call to patients daughter to follow up on CT results. Per Dr. Adrianna results with no evidence of recurrent or progressive disease. Patient to keep follow up as scheduled in 6 months. Patients daughter verbalized understanding and is in agreement.

## 2023-11-22 DIAGNOSIS — C642 Malignant neoplasm of left kidney, except renal pelvis: Secondary | ICD-10-CM

## 2023-11-22 NOTE — Progress Notes (Signed)
Survivorship Care Plan visit completed.  Treatment summary reviewed and mailed to patient.  ASCO answers booklet reviewed and mailed to patient.  CARE program and Cancer Transitions discussed with patient along with other resources cancer center offers to patients and caregivers.  Patient verbalized understanding.    

## 2023-11-27 ENCOUNTER — Ambulatory Visit: Payer: PPO | Admitting: Urology

## 2023-11-27 ENCOUNTER — Other Ambulatory Visit: Payer: Self-pay

## 2023-11-27 ENCOUNTER — Encounter: Payer: Self-pay | Admitting: Urology

## 2023-11-27 VITALS — BP 139/80 | HR 94 | Ht <= 58 in | Wt 101.8 lb

## 2023-11-27 DIAGNOSIS — D494 Neoplasm of unspecified behavior of bladder: Secondary | ICD-10-CM

## 2023-11-27 DIAGNOSIS — Z01818 Encounter for other preprocedural examination: Secondary | ICD-10-CM

## 2023-11-27 DIAGNOSIS — Z85528 Personal history of other malignant neoplasm of kidney: Secondary | ICD-10-CM

## 2023-11-27 DIAGNOSIS — C689 Malignant neoplasm of urinary organ, unspecified: Secondary | ICD-10-CM

## 2023-11-27 DIAGNOSIS — Z8551 Personal history of malignant neoplasm of bladder: Secondary | ICD-10-CM | POA: Diagnosis not present

## 2023-11-27 LAB — URINALYSIS, COMPLETE
Bilirubin, UA: NEGATIVE
Glucose, UA: NEGATIVE
Ketones, UA: NEGATIVE
Leukocytes,UA: NEGATIVE
Nitrite, UA: POSITIVE — AB
Specific Gravity, UA: >1.03 — ABNORMAL HIGH (ref 1.005–1.030)
Urobilinogen, Ur: 0.2 mg/dL (ref 0.2–1.0)
pH, UA: 5.5 (ref 5.0–7.5)

## 2023-11-27 LAB — MICROSCOPIC EXAMINATION

## 2023-11-27 NOTE — H&P (View-Only) (Signed)
   11/27/23  CC:  Chief Complaint  Patient presents with   Cysto    HPI: 78 y.o. female who returns today status post left nephroureterectomy on 04/01/2023 which was uncomplicated.    Her Foley catheter was removed a week later. Notably, she has a personal history of a large left upper pole renal mass measuring 3.8 x 2.5 cm.    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 She completed neoadjuvant Gemcitabine-cisplatin although it did have to be dose reduced.    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 The tumor was in greatest diameter 5.1 cm and was invasive. All margins were negative. pT3pNxMx.    Preoperative imaging showed no evidence of metastatic disease.    She was found to have recurrence in the bladder.  She underwent TURBT with right retrograde pyelogram and instillation of intravesical gemcitabine on 04/03/2023. Surgical pathology was consistent with high grade urothelial with focal area suspicious for focal invasion into the lamina propria. There was muscularis propria present and not involved.   She completed induction BCG x 6 in December.  Followed by Dr. Orlie Dakin.  Her most recent cross-sectional imaging is in the form of CT abdomen pelvis on 11/18/2023 which was performed without contrast due to worsening renal function.  This showed no evidence of metastatic disease.  Most recent creatinine 1.87 on 11/11/2023.  Blood pressure 127/69, pulse 72, height 4\' 9"  (1.448 m), weight 95 lb (43.1 kg). NED. A&Ox3.   No respiratory distress   Abd soft, NT, ND  Cystoscopy Procedure Note  Patient identification was confirmed, informed consent was obtained, and patient was prepped using Betadine solution.  Lidocaine jelly was administered per urethral meatus.     Pre-Procedure: - Inspection reveals a normal caliber ureteral  meatus.  Procedure: The flexible cystoscope was introduced without difficulty - No urethral strictures/lesions are present. -2 small tumors, discrete well-defined round measuring 1.5 cm each on the right posterior bladder wall as well as at the dome.  On retroflexion, there is also a tumor at the 11 o'clock position of the bladder neck, also about 1 cm.   Retroflexion shows tumors on bladder neck   Post-Procedure: - Patient tolerated the procedure well  Assessment/ Plan:  1. Malignant neoplasm of overlapping sites of bladder [facility]) Multifocal recurrence despite induction BCG  Will plan to proceed back to the operating room for TURBT and intravesical gemcitabine.  Risk and benefits were reviewed again, the patient's been through the procedure and quite familiar.  Preoperative urine culture today.  Briefly discussed the role of reinduction BCG.  This may be considered depending on pathology.  2. Urothelial carcinoma of kidney, left (HCC) Most recent cross-sectional imaging unremarkable earlier this month - Urinalysis, Complete  3. Preop testing - CULTURE, URINE COMPREHENSIVE   Vanna Scotland, MD

## 2023-11-27 NOTE — Progress Notes (Signed)
   11/27/23  CC:  Chief Complaint  Patient presents with   Cysto    HPI: 78 y.o. female who returns today status post left nephroureterectomy on 04/01/2023 which was uncomplicated.    Her Foley catheter was removed a week later. Notably, she has a personal history of a large left upper pole renal mass measuring 3.8 x 2.5 cm.    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 She completed neoadjuvant Gemcitabine-cisplatin although it did have to be dose reduced.    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 The tumor was in greatest diameter 5.1 cm and was invasive. All margins were negative. pT3pNxMx.    Preoperative imaging showed no evidence of metastatic disease.    She was found to have recurrence in the bladder.  She underwent TURBT with right retrograde pyelogram and instillation of intravesical gemcitabine on 04/03/2023. Surgical pathology was consistent with high grade urothelial with focal area suspicious for focal invasion into the lamina propria. There was muscularis propria present and not involved.   She completed induction BCG x 6 in December.  Followed by Dr. Orlie Dakin.  Her most recent cross-sectional imaging is in the form of CT abdomen pelvis on 11/18/2023 which was performed without contrast due to worsening renal function.  This showed no evidence of metastatic disease.  Most recent creatinine 1.87 on 11/11/2023.  Blood pressure 127/69, pulse 72, height 4\' 9"  (1.448 m), weight 95 lb (43.1 kg). NED. A&Ox3.   No respiratory distress   Abd soft, NT, ND  Cystoscopy Procedure Note  Patient identification was confirmed, informed consent was obtained, and patient was prepped using Betadine solution.  Lidocaine jelly was administered per urethral meatus.     Pre-Procedure: - Inspection reveals a normal caliber ureteral  meatus.  Procedure: The flexible cystoscope was introduced without difficulty - No urethral strictures/lesions are present. -2 small tumors, discrete well-defined round measuring 1.5 cm each on the right posterior bladder wall as well as at the dome.  On retroflexion, there is also a tumor at the 11 o'clock position of the bladder neck, also about 1 cm.   Retroflexion shows tumors on bladder neck   Post-Procedure: - Patient tolerated the procedure well  Assessment/ Plan:  1. Malignant neoplasm of overlapping sites of bladder [facility]) Multifocal recurrence despite induction BCG  Will plan to proceed back to the operating room for TURBT and intravesical gemcitabine.  Risk and benefits were reviewed again, the patient's been through the procedure and quite familiar.  Preoperative urine culture today.  Briefly discussed the role of reinduction BCG.  This may be considered depending on pathology.  2. Urothelial carcinoma of kidney, left (HCC) Most recent cross-sectional imaging unremarkable earlier this month - Urinalysis, Complete  3. Preop testing - CULTURE, URINE COMPREHENSIVE   Vanna Scotland, MD

## 2023-11-27 NOTE — Progress Notes (Unsigned)
Surgical Physician Order Form [facility]  Dr. Vanna Scotland, MD  * Scheduling expectation : Next Available  *Length of Case:   *Clearance needed: no  *Anticoagulation Instructions: Hold all anticoagulants  *Aspirin Instructions: Ok to continue Aspirin  *Post-op visit Date/Instructions:  1 week pathology  *Diagnosis: Bladder Tumor  *Procedure:  TURBT (small) with Gemcitabine instillation   Additional orders: Gemcitabine 2000mg  bladder instillation  -Admit type: OUTpatient  -Anesthesia: General  -VTE Prophylaxis Standing Order SCD's       Other:   -Standing Lab Orders Per Anesthesia    Lab other: None  -Standing Test orders EKG/Chest x-ray per Anesthesia       Test other:   - Medications:  Ancef 2gm IV  -Other orders:  N/A

## 2023-11-29 ENCOUNTER — Telehealth: Payer: Self-pay

## 2023-11-29 NOTE — Progress Notes (Signed)
   [facility] Surgical Posting Form  Surgery Date: Date: 12/23/2023  Surgeon: Dr. Vanna Scotland, MD  Inpt ( No  )   Outpt (Yes)   Obs ( No  )   Diagnosis: D49.4 Bladder Tumor  -CPT: 27253, [phone]  Surgery:Transurethral Resection of Bladder Tumor with Intravesical Instillation of Gemcitabine  Stop Anticoagulations: Yes, may continue ASA  Cardiac/Medical/Pulmonary Clearance needed: no  *Orders entered into EPIC  Date: 11/29/23   *Case booked in [place]  Date: 11/28/2023  *Notified pt of Surgery: Date: 11/28/2023  PRE-OP UA & CX: No  *Placed into Prior Authorization Work Angela Nevin Date: 11/29/23  Assistant/laser/rep:No

## 2023-11-29 NOTE — Telephone Encounter (Signed)
  Per Dr. Apolinar Junes, Patient is to be scheduled for Transurethral Resection of Bladder Tumor with Intravesical Instillation of Gemcitabine   Mrs. Frix and her daughter was contacted and possible surgical dates were discussed, Monday March 10th, 2025 was agreed upon for surgery.   Patient was directed to call [phone] between 1-3pm the day before surgery to find out surgical arrival time.  Instructions were given not to eat or drink from midnight on the night before surgery and have a driver for the day of surgery. On the surgery day patient was instructed to enter through the [facility] entrance of [facility] report the Same Day Surgery desk.   Pre-Admit Testing will be in contact via phone to set up an interview with the anesthesia team to review your history and medications prior to surgery.   Reminder of this information was sent via MyChart to the patient.

## 2023-12-01 LAB — CULTURE, URINE COMPREHENSIVE

## 2023-12-02 ENCOUNTER — Telehealth: Payer: Self-pay

## 2023-12-02 MED ORDER — SULFAMETHOXAZOLE-TRIMETHOPRIM 800-160 MG PO TABS: 1.0000 | ORAL_TABLET | Freq: Two times a day (BID) | ORAL | 0 refills | Status: DC

## 2023-12-02 NOTE — Telephone Encounter (Signed)
Spoke with pt. Daughter. Advised to start medication 5 days prior to surgery unless becomes symptomatic. I have sent over Rx to [facility] per patient request.

## 2023-12-02 NOTE — Telephone Encounter (Signed)
-----   Message from Vanna Scotland sent at 12/02/2023  9:05 AM EST ----- Positive preop urine culture.  Please treat with Bactrim DS twice daily starting 5 days before the procedure.  If she becomes symptomatic i.e. burning with urination, she can take the course sooner.  Vanna Scotland, MD

## 2023-12-03 ENCOUNTER — Encounter: Payer: Self-pay | Admitting: Urology

## 2023-12-06 ENCOUNTER — Ambulatory Visit
Admission: EM | Admit: 2023-12-06 | Discharge: 2023-12-06 | Disposition: A | Discharge status: Home / Self Care | Payer: PPO | Attending: Emergency Medicine | Admitting: Emergency Medicine

## 2023-12-06 DIAGNOSIS — R319 Hematuria, unspecified: Secondary | ICD-10-CM

## 2023-12-06 DIAGNOSIS — N390 Urinary tract infection, site not specified: Secondary | ICD-10-CM

## 2023-12-06 LAB — POCT URINALYSIS DIP (MANUAL ENTRY)
Glucose, UA: NEGATIVE mg/dL
Ketones, POC UA: NEGATIVE mg/dL
Nitrite, UA: POSITIVE — AB
Protein Ur, POC: 100 mg/dL — AB
Spec Grav, UA: >=1.03 — AB (ref 1.010–1.025)
Urobilinogen, UA: 0.2 U/dL
pH, UA: 5.5 (ref 5.0–8.0)

## 2023-12-06 MED ORDER — CEPHALEXIN 500 MG PO CAPS
500.0000 mg | ORAL_CAPSULE | Freq: Two times a day (BID) | ORAL | 0 refills | Status: DC
Start: 2023-12-06 — End: 2023-12-10

## 2023-12-06 NOTE — ED Provider Notes (Signed)
Jessica Morrison    CSN: [id number] Arrival date & time: 12/06/23  1317      History   Chief Complaint Chief Complaint  Patient presents with   Abdominal Pain    Low abdominal pain, fatigue and bleeding with urination    HPI Jessica Morrison is a 78 y.o. female.  Accompanied by her daughter, patient presents with lower abdominal pain and fatigue x 5 days.  She has blood in her urine this morning.  No fever or chills.  She is currently on Bactrim.   Patient has bladder cancer.  She was prescribed Bactrim DS on 12/02/2023 by her urologist which she was supposed to start 5 days before she has a procedure on 12/23/2023.  Her daughter contacted her urologist on 12/03/2023 to discuss her symptoms and was instructed to go ahead and start the Bactrim.  Patient has taken the Bactrim for 3 days now.  Her daughter contacted the urologist's office this morning about the blood in her urine and was instructed to bring her to urgent care or the emergency department.  She has an appointment with her urologist on Monday.   The history is provided by the patient, a relative and medical records.    Past Medical History:  Diagnosis Date   B12 deficiency    Back pain    Basal cell carcinoma 08/04/2020   0.5cm above the left mid brow, EDC 09/15/20   Cancer (HCC)    Melanoma   Curvature of spine    Dysplastic nevus 04/30/2013   left post shoulder, left parasternal   Dysplastic nevus 05/04/2014   right prox ant deltoid, left lat buttock   Dysplastic nevus 03/26/2019   right lat neck adjacent to the inf top of MM in situ site scar   Dysplastic nevus 10/29/2019   upper back left paraspinal at level of mid scapula   Dysplastic nevus 03/31/2020, 05/12/20 shave removal   Left mid pretibial. Moderate to severe atypia, deep margin involved.   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   right lat neck inf to right angle of mandible, excised: 07/29/2018, margins free   Memory loss    Mild mitral regurgitation by prior echocardiogram    Nephrolithiasis    Osteoarthritis    Osteopenia    Osteoporosis    Pelvic pain in female    PONV (postoperative nausea and vomiting)    Pre-diabetes    Raynaud disease    Renal cancer, left (HCC) 03/2023   Thyromegaly    Transitional cell carcinoma of kidney, left (HCC)    Urothelial carcinoma of kidney, left [facility])     Patient Active Problem List   Diagnosis Date Noted   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   Urothelial carcinoma of kidney (HCC) 11/30/2022   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   Memory disorder 06/20/2021   B12 deficiency 10/20/2019   Skin macule 08/23/2016   Family history of malignant melanoma 08/23/2016   Endometriosis 08/16/2015   Vaginal atrophy 08/16/2015   Bloodgood disease 04/22/2014  Calculus of kidney 04/22/2014   BP (high blood pressure) 03/16/2014   HLD (hyperlipidemia) 03/16/2014   Arthritis, degenerative 03/16/2014   Osteoporosis 03/16/2014   Raynaud's syndrome without gangrene 03/16/2014   Big thyroid 03/16/2014   Raynaud phenomenon 03/16/2014    Past Surgical History:  Procedure Laterality Date   ABDOMINAL HYSTERECTOMY     BILATERAL OOPHORECTOMY  1999   BLADDER INSTILLATION N/A 04/01/2023   Procedure: BLADDER INSTILLATION OF GEMCITABINE;  Surgeon: Vanna Scotland, MD;  Location: [facility];  Service: Urology;  Laterality: N/A;   BLADDER INSTILLATION N/A 07/01/2023   Procedure: BLADDER INSTILLATION OF GEMCITABINE;  Surgeon: Vanna Scotland, MD;  Location: [facility];  Service: Urology;  Laterality: N/A;   CATARACT  EXTRACTION W/PHACO Right 05/09/2023   Procedure: CATARACT EXTRACTION PHACO AND INTRAOCULAR LENS PLACEMENT (IOC) RIGHT MALYUGIN OMIDRIA 7.79 00:59.0;  Surgeon: Estanislado Pandy, MD;  Location[facility];  Service: Ophthalmology;  Laterality: Right;   CATARACT EXTRACTION W/PHACO Left 05/23/2023   Procedure: CATARACT EXTRACTION PHACO AND INTRAOCULAR LENS PLACEMENT (IOC) LEFT MALYUGIN OMIDRIA 12.13 1:23.6;  Surgeon: Estanislado Pandy, MD;  Location: [facility];  Service: Ophthalmology;  Laterality: Left;   CESAREAN SECTION     COLONOSCOPY  09/02/2020   COLONOSCOPY WITH PROPOFOL N/A 05/09/2015   Procedure: COLONOSCOPY WITH PROPOFOL;  Surgeon: Elnita Maxwell, MD;  Location: [facility];  Service: Endoscopy;  Laterality: N/A;   CYSTOSCOPY W/ RETROGRADES Right 07/01/2023   Procedure: CYSTOSCOPY WITH RETROGRADE PYELOGRAM;  Surgeon: Vanna Scotland, MD;  Location: [facility];  Service: Urology;  Laterality: Right;   CYSTOSCOPY WITH RETROGRADE PYELOGRAM, URETEROSCOPY AND STENT PLACEMENT Left 10/09/2022   Procedure: CYSTOSCOPY WITH RETROGRADE PYELOGRAM, URETEROSCOPY AND STENT PLACEMENT;  Surgeon: Riki Altes, MD;  Location: [facility];  Service: Urology;  Laterality: Left;   IR IMAGING GUIDED PORT INSERTION  12/06/2022   MELANOMA EXCISION Right 07/29/2018   lateral neck inf. to right angle of mandible   OOPHORECTOMY     ROBOT ASSITED LAPAROSCOPIC NEPHROURETERECTOMY Left 04/01/2023   Procedure: XI ROBOT ASSITED LAPAROSCOPIC NEPHROURETERECTOMY;  Surgeon: Vanna Scotland, MD;  Location: [facility];  Service: Urology;  Laterality: Left;   TRANSURETHRAL RESECTION OF BLADDER TUMOR N/A 07/01/2023   Procedure: TRANSURETHRAL RESECTION OF BLADDER TUMOR (TURBT);  Surgeon: Vanna Scotland, MD;  Location: [facility];  Service: Urology;  Laterality: N/A;   URETERAL BIOPSY Left 10/09/2022   Procedure: URETERAL BIOPSY;  Surgeon: Riki Altes, MD;  Location: [facility];  Service: Urology;   Laterality: Left;    OB History     Gravida  2   Para  2   Term  1   Preterm  1   AB      Living  1      SAB      IAB      Ectopic      Multiple      Live Births  1            Home Medications    Prior to Admission medications   Medication Sig Start Date End Date Taking? Authorizing Provider  acetaminophen (TYLENOL) 325 MG tablet Take 650 mg by mouth every 6 (six) hours as needed.   Yes [provider]  calcium-vitamin D (OSCAL WITH D) 500-200 MG-UNIT tablet 1 tablet daily with breakfast.   Yes [provider]  cephALEXin (KEFLEX) 500 MG capsule Take 1 capsule (500 mg total) by mouth 2 (two) times daily for 5 days. 12/06/23 12/11/23 Yes  Mickie Bail, NP  Cholecalciferol 25 MCG (1000 UT) tablet Take 1,000 Units by mouth daily.   Yes [provider]  cyanocobalamin 1000 MCG tablet Take 1,000 mcg by mouth daily.   Yes [provider]  desvenlafaxine (PRISTIQ) 25 MG 24 hr tablet Take 1 tablet by mouth daily. 06/26/23 06/25/24 Yes [provider]  donepezil (ARICEPT) 10 MG tablet TAKE 1 TABLET BY MOUTH ONCE EVERY EVENING 08/06/21  Yes [provider]  lidocaine-prilocaine (EMLA) cream Apply to affected area once 12/01/22  Yes Finnegan, Tollie Pizza, MD  memantine (NAMENDA) 5 MG tablet Take 5 mg by mouth in the morning and at bedtime. 05/10/21  Yes [provider]  omeprazole (PRILOSEC) 40 MG capsule 40 mg daily. 10/29/19  Yes [provider]  ondansetron (ZOFRAN) 8 MG tablet Take 1 tablet (8 mg total) by mouth every 8 (eight) hours as needed for nausea or vomiting. Start on the third day after cisplatin. 12/01/22  Yes Jeralyn Ruths, MD  oxybutynin (DITROPAN) 5 MG tablet Take 1 tablet (5 mg total) by mouth every 8 (eight) hours as needed for bladder spasms. 07/01/23  Yes Vanna Scotland, MD  prochlorperazine (COMPAZINE) 10 MG tablet Take 1 tablet (10 mg total) by mouth every 6 (six) hours as needed (Nausea  or vomiting). 12/01/22  Yes Jeralyn Ruths, MD  raloxifene (EVISTA) 60 MG tablet TAKE ONE TABLET BY MOUTH ONCE A DAY 09/19/23  Yes Linzie Collin, MD  rosuvastatin (CRESTOR) 5 MG tablet Take 5 mg by mouth daily.   Yes [provider]    Family History Family History  Problem Relation Age of Onset   Emphysema Father    Cancer Neg Hx    Heart disease Neg Hx    Diabetes Neg Hx    Breast cancer Neg Hx     Social History Social History   Tobacco Use   Smoking status: Never    Passive exposure: Never   Smokeless tobacco: Never  Vaping Use   Vaping status: Never Used  Substance Use Topics   Alcohol use: No   Drug use: No     Allergies   Codeine, Neosporin wound cleanser [benzalkonium chloride], and Penicillins   Review of Systems Review of Systems  Constitutional:  Negative for chills and fever.  Gastrointestinal:  Positive for abdominal pain. Negative for diarrhea, nausea and vomiting.  Genitourinary:  Positive for hematuria. Negative for dysuria and flank pain.     Physical Exam Triage Vital Signs ED Triage Vitals  Encounter Vitals Group     BP 12/06/23 1424 (!) 148/73     Systolic BP Percentile --      Diastolic BP Percentile --      Pulse Rate 12/06/23 1424 79     Resp 12/06/23 1424 17     Temp 12/06/23 1424 98.3 F (36.8 C)     Temp Source 12/06/23 1424 Oral     SpO2 12/06/23 1424 96 %     Weight 12/06/23 1422 100 lb (45.4 kg)     Height 12/06/23 1422 4\' 9"  (1.448 m)     Head Circumference --      Peak Flow --      Pain Score 12/06/23 1422 7     Pain Loc --      Pain Education --      Exclude from Growth Chart --    No data found.  Updated Vital Signs BP (!) 148/73 (BP Location: Left Arm)  Pulse 79   Temp 98.3 F (36.8 C) (Oral)   Resp 17   Ht 4\' 9"  (1.448 m)   Wt 100 lb (45.4 kg)   SpO2 96%   BMI 21.64 kg/m   Visual Acuity Right Eye Distance:   Left Eye Distance:   Bilateral Distance:    Right Eye Near:   Left Eye  Near:    Bilateral Near:     Physical Exam Constitutional:      General: She is not in acute distress. HENT:     Mouth/Throat:     Mouth: Mucous membranes are moist.  Cardiovascular:     Rate and Rhythm: Normal rate and regular rhythm.     Heart sounds: Normal heart sounds.  Pulmonary:     Effort: Pulmonary effort is normal. No respiratory distress.     Breath sounds: Normal breath sounds.  Abdominal:     General: Bowel sounds are normal.     Palpations: Abdomen is soft.     Tenderness: There is abdominal tenderness. There is no right CVA tenderness, left CVA tenderness, guarding or rebound.     Comments: Mild suprapubic tenderness to palpation.  No rebound or guarding.  Neurological:     Mental Status: She is alert.      UC Treatments / Results  Labs (all labs ordered are listed, but only abnormal results are displayed) Labs Reviewed  POCT URINALYSIS DIP (MANUAL ENTRY) - Abnormal; Notable for the following components:      Result Value   Bilirubin, UA small (*)    Spec Grav, UA >=1.030 (*)    Blood, UA moderate (*)    Protein Ur, POC =100 (*)    Nitrite, UA Positive (*)    Leukocytes, UA Trace (*)    All other components within normal limits    EKG   Radiology No results found.  Procedures Procedures (including critical care time)  Medications Ordered in UC Medications - No data to display  Initial Impression / Assessment and Plan / UC Course  I have reviewed the triage vital signs and the nursing notes.  Pertinent labs & imaging results that were available during my care of the patient were reviewed by me and considered in my medical decision making (see chart for details).    Urinary tract infection with hematuria.  Afebrile and vital signs are stable.  Patient is currently on Bactrim DS which she has taken for 3 days.  Based on her most recent urine culture on 11/27/2023, the Bactrim should be adequate for treatment.  However, patient noted hematuria  today and her other symptoms have worsened.  She does not have fever or chills.  Changing her antibiotic therapy today to Keflex.  Instructed her to stop the Bactrim and start the Keflex.  Instructed her to keep the appointment with her urologist on Monday.  Strict ED precautions given.  Education provided on UTI.  Patient and her daughter agree to plan of care.  Final Clinical Impressions(s) / UC Diagnoses   Final diagnoses:  Urinary tract infection with hematuria, site unspecified     Discharge Instructions      Stop taking the Bactrim.  Start taking the cephalexin.  Follow-up with your urologist on Monday.  Go to the emergency department if you have worsening symptoms.     ED Prescriptions     Medication Sig Dispense Auth. Provider   cephALEXin (KEFLEX) 500 MG capsule Take 1 capsule (500 mg total) by mouth 2 (two) times  daily for 5 days. 10 capsule Mickie Bail, NP      PDMP not reviewed this encounter.   Mickie Bail, NP 12/06/23 [phone]

## 2023-12-06 NOTE — ED Triage Notes (Signed)
Daughter states that mom has low abdominal pain, fatigue and blood in her urine. X5 days  Pt currently has bladder cancer. Pt is currently taking bactrim and was told to be seen at an urgent care if symptoms became worse.

## 2023-12-06 NOTE — Discharge Instructions (Addendum)
Stop taking the Bactrim.  Start taking the cephalexin.  Follow-up with your urologist on Monday.  Go to the emergency department if you have worsening symptoms.

## 2023-12-06 NOTE — Telephone Encounter (Signed)
Joni Reining, patient's daughter advised, she will go ahead and take patient to be seen at [facility] or ER but wanted to go ahead and make an appointment for Monday just in case.

## 2023-12-08 NOTE — Progress Notes (Unsigned)
 12/09/2023 9:54 PM   Jessica Morrison [birth year] [id number]  Referring provider: Marguarite Arbour, MD [address] [facility] [place],  [place] [address]  Urological history: 1. High risk hematuria -non-smoker -CTU (09/2022) - 3.8 complex cystic lesion in upper left pole kidney -cysto/left URS (09/2022) - papillary tumor superior infundibulum/calyx - high grade urothelial carcinoma   2. 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 Bladder cancer -TURBT (06/2023) multifocal high grad urothelial w/ invasion into lamina propria  -completed induction BCG (09/2023)   No chief complaint on file.  HPI: Jessica Morrison is a 78 y.o. female who presents today for gross hematuria and fatigue after taking Bactrim for UTI.    Previous records reviewed.   She is scheduled for a TURBT on March 10th, 2025.  Her preoperative urine culture was positive for E.coli and she was instructed to start Bactrim 5 days prior to her TURBT.   She started to experience UTI symptoms, so she started the Bactrim last week.  Her daughter contacted the office on Friday, stating that her mother was doing worse and developed gross heme.   She was seen at  [facility] urgent care in [place] and they switched her to Keflex. 500 gm twice daily.     PMH: Past Medical History:  Diagnosis Date   B12 deficiency    Back pain    Basal cell carcinoma 08/04/2020   0.5cm above the left mid brow, EDC 09/15/20   Cancer (HCC)    Melanoma   Curvature of spine    Dysplastic nevus 04/30/2013   left post shoulder, left parasternal   Dysplastic nevus 05/04/2014   right prox ant deltoid, left lat buttock   Dysplastic nevus 03/26/2019   right lat neck adjacent to the inf top of MM in situ site  scar   Dysplastic nevus 10/29/2019   upper back left paraspinal at level of mid scapula   Dysplastic nevus 03/31/2020, 05/12/20 shave removal   Left mid pretibial. Moderate to severe atypia, deep margin involved.   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   right lat neck inf to right angle of mandible, excised: 07/29/2018, margins free   Memory loss    Mild mitral regurgitation by prior echocardiogram    Nephrolithiasis    Osteoarthritis    Osteopenia    Osteoporosis    Pelvic pain in female    PONV (postoperative nausea and vomiting)    Pre-diabetes    Raynaud disease    Renal cancer, left (HCC) 03/2023   Thyromegaly    Transitional cell carcinoma of kidney, left (HCC)    Urothelial carcinoma of kidney, left (HCC)     Surgical History: Past Surgical History:  Procedure Laterality Date   ABDOMINAL HYSTERECTOMY     BILATERAL OOPHORECTOMY  1999   BLADDER INSTILLATION N/A 04/01/2023   Procedure: BLADDER INSTILLATION OF GEMCITABINE;  Surgeon: Vanna Scotland, MD;  Location:  [facility];  Service: Urology;  Laterality: N/A;   BLADDER INSTILLATION N/A 07/01/2023   Procedure: BLADDER INSTILLATION OF GEMCITABINE;  Surgeon: Vanna Scotland, MD;  Location: [facility];  Service: Urology;  Laterality: N/A;   CATARACT EXTRACTION W/PHACO Right 05/09/2023   Procedure: CATARACT EXTRACTION PHACO AND INTRAOCULAR LENS PLACEMENT (IOC) RIGHT MALYUGIN OMIDRIA 7.79 00:59.0;  Surgeon: Estanislado Pandy, MD;  Location: [facility];  Service: Ophthalmology;  Laterality: Right;   CATARACT EXTRACTION W/PHACO Left 05/23/2023   Procedure: CATARACT EXTRACTION PHACO AND INTRAOCULAR  LENS PLACEMENT (IOC) LEFT MALYUGIN OMIDRIA 12.13 1:23.6;  Surgeon: Estanislado Pandy, MD;  Location: [facility];  Service: Ophthalmology;  Laterality: Left;   CESAREAN SECTION     COLONOSCOPY  09/02/2020   COLONOSCOPY WITH PROPOFOL N/A 05/09/2015   Procedure: COLONOSCOPY WITH PROPOFOL;  Surgeon: Elnita Maxwell, MD;  Location: [facility];  Service: Endoscopy;  Laterality: N/A;   CYSTOSCOPY W/ RETROGRADES Right 07/01/2023   Procedure: CYSTOSCOPY WITH RETROGRADE PYELOGRAM;  Surgeon: Vanna Scotland, MD;  Location: [facility];  Service: Urology;  Laterality: Right;   CYSTOSCOPY WITH RETROGRADE PYELOGRAM, URETEROSCOPY AND STENT PLACEMENT Left 10/09/2022   Procedure: CYSTOSCOPY WITH RETROGRADE PYELOGRAM, URETEROSCOPY AND STENT PLACEMENT;  Surgeon: Riki Altes, MD;  Location: [facility];  Service: Urology;  Laterality: Left;   IR IMAGING GUIDED PORT INSERTION  12/06/2022   MELANOMA EXCISION Right 07/29/2018   lateral neck inf. to right angle of mandible   OOPHORECTOMY     ROBOT ASSITED LAPAROSCOPIC NEPHROURETERECTOMY Left 04/01/2023   Procedure: XI ROBOT ASSITED LAPAROSCOPIC NEPHROURETERECTOMY;  Surgeon: Vanna Scotland, MD;  Location: [facility];  Service: Urology;  Laterality: Left;   TRANSURETHRAL RESECTION OF BLADDER TUMOR N/A 07/01/2023   Procedure: TRANSURETHRAL RESECTION OF BLADDER TUMOR (TURBT);  Surgeon: Vanna Scotland, MD;  Location: [facility];  Service: Urology;  Laterality: N/A;   URETERAL BIOPSY Left 10/09/2022   Procedure: URETERAL BIOPSY;  Surgeon: Riki Altes, MD;  Location: [facility];  Service: Urology;  Laterality: Left;    Home Medications:  Allergies as of 12/09/2023       Reactions   Codeine Nausea And Vomiting   Neosporin Wound Cleanser [benzalkonium Chloride] Other (See Comments)   Skin blisters   Penicillins Hives        Medication List        Accurate as of December 08, 2023  9:54 PM. If you have any questions, ask your nurse or doctor.           acetaminophen 325 MG tablet Commonly known as: TYLENOL Take 650 mg by mouth every 6 (six) hours as needed.   calcium-vitamin D 500-200 MG-UNIT tablet Commonly known as: OSCAL WITH D 1 tablet daily with breakfast.   cephALEXin 500 MG capsule Commonly known as: KEFLEX Take 1 capsule (500 mg total) by mouth 2 (two) times daily for 5 days.   Cholecalciferol 25 MCG (1000 UT) tablet Take 1,000 Units by mouth daily.   cyanocobalamin 1000 MCG tablet Take 1,000 mcg by mouth daily.   desvenlafaxine 25 MG 24 hr tablet Commonly known as: PRISTIQ Take 1 tablet by mouth daily.   donepezil 10 MG tablet Commonly known as: ARICEPT TAKE 1 TABLET BY MOUTH ONCE EVERY EVENING   lidocaine-prilocaine cream Commonly known as: EMLA Apply to affected area once   memantine 5 MG tablet Commonly known as: NAMENDA Take 5 mg by mouth in the morning and at bedtime.   omeprazole 40 MG capsule Commonly known as: PRILOSEC  40 mg daily.   ondansetron 8 MG tablet Commonly known as: Zofran Take 1 tablet (8 mg total) by mouth every 8 (eight) hours as needed for nausea or vomiting. Start on the third day after cisplatin.   oxybutynin 5 MG tablet Commonly known as: DITROPAN Take 1 tablet (5 mg total) by mouth every 8 (eight) hours as needed for bladder spasms.   prochlorperazine 10 MG tablet Commonly known as: COMPAZINE Take 1 tablet (10 mg total) by mouth every 6 (six) hours as needed (Nausea or vomiting).   raloxifene 60 MG tablet Commonly known as: EVISTA TAKE ONE TABLET BY MOUTH ONCE A DAY   rosuvastatin 5 MG tablet Commonly known as: CRESTOR Take 5 mg by mouth daily.        Allergies:  Allergies  Allergen Reactions   Codeine Nausea And Vomiting   Neosporin Wound Cleanser [Benzalkonium Chloride] Other (See Comments)    Skin blisters   Penicillins Hives    Family History: Family History  Problem Relation Age of Onset   Emphysema Father    Cancer Neg Hx    Heart  disease Neg Hx    Diabetes Neg Hx    Breast cancer Neg Hx     Social History:  reports that she has never smoked. She has never been exposed to tobacco smoke. She has never used smokeless tobacco. She reports that she does not drink alcohol and does not use drugs.  ROS: Pertinent ROS in HPI  Physical Exam: There were no vitals taken for this visit.  Constitutional:  Well nourished. Alert and oriented, No acute distress. HEENT: [place] AT, moist mucus membranes.  Trachea midline, no masses. Cardiovascular: No clubbing, cyanosis, or edema. Respiratory: Normal respiratory effort, no increased work of breathing. GU: No CVA tenderness.  No bladder fullness or masses.  Recession of labia minora, dry, pale vulvar vaginal mucosa and loss of mucosal ridges and folds.  Normal urethral meatus, no lesions, no prolapse, no discharge.   No urethral masses, tenderness and/or tenderness. No bladder fullness, tenderness or masses. *** vagina mucosa, *** estrogen effect, no discharge, no lesions, *** pelvic support, *** cystocele and *** rectocele noted.  No cervical motion tenderness.  Uterus is freely mobile and non-fixed.  No adnexal/parametria masses or tenderness noted.  Anus and perineum are without rashes or lesions.   ***  Neurologic: Grossly intact, no focal deficits, moving all 4 extremities. Psychiatric: Normal mood and affect.    Laboratory Data: Lab Results  Component Value Date   WBC 7.7 11/11/2023   HGB 10.4 (L) 11/11/2023   HCT 32.3 (L) 11/11/2023   MCV 90.2 11/11/2023   PLT 142 (L) 11/11/2023    Lab Results  Component Value Date   CREATININE 1.87 (H) 11/11/2023    Lab Results  Component Value Date   AST 20 11/11/2023   Lab Results  Component Value Date   ALT 13 11/11/2023   Urinalysis See EPIC and HPI *** I have reviewed the labs   Pertinent Imaging: N/A  Assessment & Plan:  ***  1. E.coli UTI -resolved -will need repeat UA/UCX next week  2. Solitary  kidney -reminded to avoid nephrotoxins  3. Left kidney urothelial carcinoma  -s/p left nephroureterectomy   4. Bladder cancer -scheduled for TURBT 03/10    No follow-ups on file.  These notes generated with voice recognition software. I apologize for typographical errors.  Cloretta Ned  [facility] [address]  [address] [place],  [place] [address] [phone]

## 2023-12-09 ENCOUNTER — Other Ambulatory Visit
Admission: RE | Admit: 2023-12-09 | Discharge: 2023-12-09 | Disposition: A | Discharge status: Home / Self Care | Payer: PPO | Attending: Urology | Admitting: Urology

## 2023-12-09 ENCOUNTER — Encounter: Payer: Self-pay | Admitting: Urology

## 2023-12-09 ENCOUNTER — Emergency Department: Payer: PPO

## 2023-12-09 ENCOUNTER — Ambulatory Visit (INDEPENDENT_AMBULATORY_CARE_PROVIDER_SITE_OTHER): Payer: PPO | Admitting: Urology

## 2023-12-09 ENCOUNTER — Observation Stay
Admission: EM | Admit: 2023-12-09 | Discharge: 2023-12-10 | Disposition: A | Discharge status: Home / Self Care | Payer: PPO | Attending: Internal Medicine | Admitting: Internal Medicine

## 2023-12-09 ENCOUNTER — Other Ambulatory Visit: Payer: Self-pay

## 2023-12-09 VITALS — BP 150/75 | HR 83 | Ht <= 58 in | Wt 99.1 lb

## 2023-12-09 DIAGNOSIS — B9620 Unspecified Escherichia coli [E. coli] as the cause of diseases classified elsewhere: Secondary | ICD-10-CM | POA: Insufficient documentation

## 2023-12-09 DIAGNOSIS — C689 Malignant neoplasm of urinary organ, unspecified: Secondary | ICD-10-CM

## 2023-12-09 DIAGNOSIS — N390 Urinary tract infection, site not specified: Secondary | ICD-10-CM | POA: Insufficient documentation

## 2023-12-09 DIAGNOSIS — D494 Neoplasm of unspecified behavior of bladder: Secondary | ICD-10-CM

## 2023-12-09 DIAGNOSIS — Z85828 Personal history of other malignant neoplasm of skin: Secondary | ICD-10-CM | POA: Insufficient documentation

## 2023-12-09 DIAGNOSIS — C679 Malignant neoplasm of bladder, unspecified: Secondary | ICD-10-CM | POA: Insufficient documentation

## 2023-12-09 DIAGNOSIS — R531 Weakness: Secondary | ICD-10-CM | POA: Diagnosis present

## 2023-12-09 DIAGNOSIS — I10 Essential (primary) hypertension: Secondary | ICD-10-CM | POA: Diagnosis not present

## 2023-12-09 DIAGNOSIS — E785 Hyperlipidemia, unspecified: Secondary | ICD-10-CM | POA: Diagnosis not present

## 2023-12-09 DIAGNOSIS — N179 Acute kidney failure, unspecified: Secondary | ICD-10-CM | POA: Diagnosis not present

## 2023-12-09 DIAGNOSIS — N184 Chronic kidney disease, stage 4 (severe): Secondary | ICD-10-CM | POA: Insufficient documentation

## 2023-12-09 DIAGNOSIS — R3915 Urgency of urination: Secondary | ICD-10-CM

## 2023-12-09 DIAGNOSIS — Z8551 Personal history of malignant neoplasm of bladder: Secondary | ICD-10-CM | POA: Diagnosis not present

## 2023-12-09 DIAGNOSIS — R0602 Shortness of breath: Secondary | ICD-10-CM | POA: Diagnosis not present

## 2023-12-09 DIAGNOSIS — R5381 Other malaise: Secondary | ICD-10-CM | POA: Insufficient documentation

## 2023-12-09 DIAGNOSIS — Z79899 Other long term (current) drug therapy: Secondary | ICD-10-CM | POA: Insufficient documentation

## 2023-12-09 DIAGNOSIS — Z905 Acquired absence of kidney: Secondary | ICD-10-CM

## 2023-12-09 DIAGNOSIS — I129 Hypertensive chronic kidney disease with stage 1 through stage 4 chronic kidney disease, or unspecified chronic kidney disease: Secondary | ICD-10-CM | POA: Diagnosis not present

## 2023-12-09 DIAGNOSIS — F0390 Unspecified dementia without behavioral disturbance: Secondary | ICD-10-CM | POA: Insufficient documentation

## 2023-12-09 DIAGNOSIS — N1330 Unspecified hydronephrosis: Secondary | ICD-10-CM | POA: Diagnosis not present

## 2023-12-09 DIAGNOSIS — Z85528 Personal history of other malignant neoplasm of kidney: Secondary | ICD-10-CM | POA: Insufficient documentation

## 2023-12-09 LAB — CBC
HCT: 38 % (ref 36.0–46.0)
Hemoglobin: 12.5 g/dL (ref 12.0–15.0)
MCH: 28.3 pg (ref 26.0–34.0)
MCHC: 32.9 g/dL (ref 30.0–36.0)
MCV: 86.2 fL (ref 80.0–100.0)
Platelets: 167 10*3/uL (ref 150–400)
RBC: 4.41 MIL/uL (ref 3.87–5.11)
RDW: 13.2 % (ref 11.5–15.5)
WBC: 9.3 10*3/uL (ref 4.0–10.5)
nRBC: 0 % (ref 0.0–0.2)

## 2023-12-09 LAB — URINALYSIS, COMPLETE (UACMP) WITH MICROSCOPIC
Bilirubin Urine: NEGATIVE
Glucose, UA: NEGATIVE mg/dL
Ketones, ur: NEGATIVE mg/dL
Leukocytes,Ua: NEGATIVE
Nitrite: NEGATIVE
Protein, ur: 30 mg/dL — AB
Specific Gravity, Urine: 1.02 (ref 1.005–1.030)
pH: 5.5 (ref 5.0–8.0)

## 2023-12-09 LAB — BRAIN NATRIURETIC PEPTIDE: B Natriuretic Peptide: 44.7 pg/mL (ref 0.0–100.0)

## 2023-12-09 LAB — BASIC METABOLIC PANEL
Anion gap: 13 (ref 5–15)
BUN: 43 mg/dL — ABNORMAL HIGH (ref 8–23)
CO2: 22 mmol/L (ref 22–32)
Calcium: 9.1 mg/dL (ref 8.9–10.3)
Chloride: 96 mmol/L — ABNORMAL LOW (ref 98–111)
Creatinine, Ser: 2.69 mg/dL — ABNORMAL HIGH (ref 0.44–1.00)
GFR, Estimated: 18 mL/min — ABNORMAL LOW (ref >60)
Glucose, Bld: 113 mg/dL — ABNORMAL HIGH (ref 70–99)
Potassium: 3.7 mmol/L (ref 3.5–5.1)
Sodium: 131 mmol/L — ABNORMAL LOW (ref 135–145)

## 2023-12-09 LAB — BLADDER SCAN AMB NON-IMAGING: Scan Result: 34

## 2023-12-09 LAB — TROPONIN I (HIGH SENSITIVITY): Troponin I (High Sensitivity): 9 ng/L (ref <18)

## 2023-12-09 LAB — CK: Total CK: 101 U/L (ref 38–234)

## 2023-12-09 MED ORDER — SODIUM CHLORIDE 0.9 % IV BOLUS
1000.0000 mL | Freq: Once | INTRAVENOUS | Status: AC
  Administered 2023-12-09: 1000 mL via INTRAVENOUS

## 2023-12-09 MED ORDER — SODIUM CHLORIDE 0.9% FLUSH: 10.0000 mL | INTRAVENOUS | Status: DC | PRN

## 2023-12-09 MED ORDER — SODIUM CHLORIDE 0.9 % IV SOLN: INTRAVENOUS | Status: DC

## 2023-12-09 MED ORDER — SODIUM CHLORIDE 0.9% FLUSH
10.0000 mL | Freq: Two times a day (BID) | INTRAVENOUS | Status: DC
  Administered 2023-12-09 – 2023-12-10 (×2): 10 mL

## 2023-12-09 MED ORDER — RALOXIFENE HCL 60 MG PO TABS
60.0000 mg | ORAL_TABLET | Freq: Every day | ORAL | Status: DC
  Administered 2023-12-10: 60 mg via ORAL
  Filled 2023-12-09: qty 1

## 2023-12-09 MED ORDER — TECHNETIUM TO 99M ALBUMIN AGGREGATED
4.0000 | Freq: Once | INTRAVENOUS | Status: AC | PRN
  Administered 2023-12-09: 4.01 via INTRAVENOUS

## 2023-12-09 MED ORDER — SODIUM CHLORIDE 0.9 % IV SOLN
1.0000 g | INTRAVENOUS | Status: DC
  Administered 2023-12-09: 1 g via INTRAVENOUS
  Filled 2023-12-09 (×2): qty 10

## 2023-12-09 MED ORDER — ONDANSETRON HCL 4 MG PO TABS: 4.0000 mg | ORAL_TABLET | Freq: Three times a day (TID) | ORAL | Status: DC | PRN

## 2023-12-09 MED ORDER — DONEPEZIL HCL 5 MG PO TABS
10.0000 mg | ORAL_TABLET | Freq: Every day | ORAL | Status: DC
  Administered 2023-12-09: 10 mg via ORAL
  Filled 2023-12-09: qty 2

## 2023-12-09 MED ORDER — ACETAMINOPHEN 325 MG PO TABS
650.0000 mg | ORAL_TABLET | Freq: Four times a day (QID) | ORAL | Status: DC | PRN
  Administered 2023-12-09 – 2023-12-10 (×3): 650 mg via ORAL
  Filled 2023-12-09 (×3): qty 2

## 2023-12-09 MED ORDER — MEMANTINE HCL 5 MG PO TABS
5.0000 mg | ORAL_TABLET | Freq: Every day | ORAL | Status: DC
  Administered 2023-12-09: 5 mg via ORAL
  Filled 2023-12-09: qty 1

## 2023-12-09 MED ORDER — BISACODYL 5 MG PO TBEC: 5.0000 mg | DELAYED_RELEASE_TABLET | Freq: Every day | ORAL | Status: DC | PRN

## 2023-12-09 MED ORDER — CEFTRIAXONE SODIUM 1 G IJ SOLR: 1.0000 g | INTRAMUSCULAR | Status: DC

## 2023-12-09 MED ORDER — CHLORHEXIDINE GLUCONATE CLOTH 2 % EX PADS
6.0000 | MEDICATED_PAD | Freq: Every day | CUTANEOUS | Status: DC
  Administered 2023-12-09 – 2023-12-10 (×2): 6 via TOPICAL

## 2023-12-09 MED ORDER — PROCHLORPERAZINE MALEATE 10 MG PO TABS: 10.0000 mg | ORAL_TABLET | Freq: Four times a day (QID) | ORAL | Status: DC | PRN

## 2023-12-09 MED ORDER — VENLAFAXINE HCL ER 37.5 MG PO CP24
37.5000 mg | ORAL_CAPSULE | Freq: Every day | ORAL | Status: DC
  Administered 2023-12-10: 37.5 mg via ORAL
  Filled 2023-12-09: qty 1

## 2023-12-09 MED ORDER — PANTOPRAZOLE SODIUM 40 MG PO TBEC: 40.0000 mg | DELAYED_RELEASE_TABLET | Freq: Every day | ORAL | Status: DC

## 2023-12-09 MED ORDER — OXYBUTYNIN CHLORIDE 5 MG PO TABS: 5.0000 mg | ORAL_TABLET | Freq: Three times a day (TID) | ORAL | Status: DC | PRN

## 2023-12-09 MED ORDER — ROSUVASTATIN CALCIUM 10 MG PO TABS
5.0000 mg | ORAL_TABLET | Freq: Every day | ORAL | Status: DC
  Administered 2023-12-10: 5 mg via ORAL
  Filled 2023-12-09: qty 1

## 2023-12-09 NOTE — ED Triage Notes (Signed)
 Pt sent from urology due to decreased kidney function, pt states she only has one kidney due to CA and kidney was removed June 2024. Pt states recent UTI and took full course of ABX and states repeat UA looked like UTI has subsided. Pt denies fevers.   Pt states labs and UA repeated in Urology today.

## 2023-12-09 NOTE — H&P (Signed)
 History and Physical    Jessica Morrison ZOX:[id number] DOB: June 06, 1946 DOA: 12/09/2023  PCP: Marguarite Arbour, MD (Confirm with patient/family/NH records and if not entered, this has to be entered at [facility] point of entry) Patient coming from: Home  I have personally briefly reviewed patient's old medical records in [facility]  Chief Complaint: Feeling tired  HPI: Jessica Morrison is a 78 y.o. 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  Patient started develop dysuria and went to see urology early last week, when she was diagnosed with UTI, urine culture showed pansensitive E. coli and patient was started on Bactrim twice daily.  Patient took Bactrim Tuesday Wednesday and Thursday and Friday morning, patient developed large amount of hematuria and went to see urgent care Friday evening, Bactrim was discontinued and patient started on 5 days course of Keflex.  Over the weekend patient has seen the improvement of hematuria but started develop generalized weakness.  Denied any nauseous vomiting.  She reported that she has been maintaining hydrated, no diarrhea.  No back pains.  Today she went to see urology, blood work in the office showed AKI on CKD creatinine 2.69 compared to baseline 1.8-2.0.  And patient sent to ED ED Course: Afebrile, not tachycardia blood pressure controlled not hypoxic.  Blood work showed creatinine 2.6 compared to baseline 1.8-2.0, K3.7, bicarb 22, hemoglobin 12.5, WBC 9.3.  Patient was given IV fluid 1000 mL bolus in the ED.  Review of Systems: As per HPI otherwise 14 point review of systems negative.    Past Medical History:  Diagnosis Date   B12 deficiency    Back pain    Basal cell carcinoma  08/04/2020   0.5cm above the left mid brow, EDC 09/15/20   Cancer (HCC)    Melanoma   Curvature of spine    Dysplastic nevus 04/30/2013   left post shoulder, left parasternal   Dysplastic nevus 05/04/2014   right prox ant deltoid, left lat buttock   Dysplastic nevus 03/26/2019   right lat neck adjacent to the inf top of MM in situ site scar   Dysplastic nevus 10/29/2019   upper back left paraspinal at level of mid scapula   Dysplastic nevus 03/31/2020, 05/12/20 shave removal   Left mid pretibial. Moderate to severe atypia, deep margin involved.   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   right lat neck inf to right angle of mandible, excised: 07/29/2018, margins free   Memory loss    Mild mitral regurgitation by prior echocardiogram    Nephrolithiasis    Osteoarthritis    Osteopenia    Osteoporosis    Pelvic pain in female    PONV (postoperative nausea and vomiting)    Pre-diabetes    Raynaud disease    Renal cancer, left (HCC) 03/2023   Thyromegaly    Transitional cell carcinoma of kidney, left (HCC)  Urothelial carcinoma of kidney, left (HCC)     Past Surgical History:  Procedure Laterality Date   ABDOMINAL HYSTERECTOMY     BILATERAL OOPHORECTOMY  1999   BLADDER INSTILLATION N/A 04/01/2023   Procedure: BLADDER INSTILLATION OF GEMCITABINE;  Surgeon: Vanna Scotland, MD;  Location: [facility] OR[facility];  Service: Urology;  Laterality: N/A;   BLADDER INSTILLATION N/A 07/01/2023   Procedure: BLADDER INSTILLATION OF GEMCITABINE;  Surgeon: Vanna Scotland, MD;  Location: [facility];  Service: Urology;  Laterality: N/A;   CATARACT EXTRACTION W/PHACO Right 05/09/2023    Procedure: CATARACT EXTRACTION PHACO AND INTRAOCULAR LENS PLACEMENT (IOC) RIGHT MALYUGIN OMIDRIA 7.79 00:59.0;  Surgeon: Estanislado Pandy, MD;  Location: [facility];  Service: Ophthalmology;  Laterality: Right;   CATARACT EXTRACTION W/PHACO Left 05/23/2023   Procedure: CATARACT EXTRACTION PHACO AND INTRAOCULAR LENS PLACEMENT (IOC) LEFT MALYUGIN OMIDRIA 12.13 1:23.6;  Surgeon: Estanislado Pandy, MD;  Location: [facility];  Service: Ophthalmology;  Laterality: Left;   CESAREAN SECTION     COLONOSCOPY  09/02/2020   COLONOSCOPY WITH PROPOFOL N/A 05/09/2015   Procedure: COLONOSCOPY WITH PROPOFOL;  Surgeon: Elnita Maxwell, MD;  Location: [facility];  Service: Endoscopy;  Laterality: N/A;   CYSTOSCOPY W/ RETROGRADES Right 07/01/2023   Procedure: CYSTOSCOPY WITH RETROGRADE PYELOGRAM;  Surgeon: Vanna Scotland, MD;  Location: [facility];  Service: Urology;  Laterality: Right;   CYSTOSCOPY WITH RETROGRADE PYELOGRAM, URETEROSCOPY AND STENT PLACEMENT Left 10/09/2022   Procedure: CYSTOSCOPY WITH RETROGRADE PYELOGRAM, URETEROSCOPY AND STENT PLACEMENT;  Surgeon: Riki Altes, MD;  Location: [facility];  Service: Urology;  Laterality: Left;   IR IMAGING GUIDED PORT INSERTION  12/06/2022   MELANOMA EXCISION Right 07/29/2018   lateral neck inf. to right angle of mandible   OOPHORECTOMY     ROBOT ASSITED LAPAROSCOPIC NEPHROURETERECTOMY Left 04/01/2023   Procedure: XI ROBOT ASSITED LAPAROSCOPIC NEPHROURETERECTOMY;  Surgeon: Vanna Scotland, MD;  Location: [facility];  Service: Urology;  Laterality: Left;   TRANSURETHRAL RESECTION OF BLADDER TUMOR N/A 07/01/2023   Procedure: TRANSURETHRAL RESECTION OF BLADDER TUMOR (TURBT);  Surgeon: Vanna Scotland, MD;  Location: [facility];  Service: Urology;  Laterality: N/A;   URETERAL BIOPSY Left 10/09/2022   Procedure: URETERAL BIOPSY;  Surgeon: Riki Altes, MD;  Location: [facility];  Service: Urology;  Laterality: Left;     reports that she  has never smoked. She has never been exposed to tobacco smoke. She has never used smokeless tobacco. She reports that she does not drink alcohol and does not use drugs.  Allergies  Allergen Reactions   Codeine Nausea And Vomiting   Neosporin Wound Cleanser [Benzalkonium Chloride] Other (See Comments)    Skin blisters   Penicillins Hives    Family History  Problem Relation Age of Onset   Emphysema Father    Cancer Neg Hx    Heart disease Neg Hx    Diabetes Neg Hx    Breast cancer Neg Hx      Prior to Admission medications   Medication Sig Start Date End Date Taking? Authorizing Provider  acetaminophen (TYLENOL) 325 MG tablet Take 650 mg by mouth every 6 (six) hours as needed.    [provider]  calcium-vitamin D (OSCAL WITH D) 500-200 MG-UNIT tablet 1 tablet daily with breakfast.    [provider]  cephALEXin (KEFLEX) 500 MG capsule Take 1 capsule (500 mg total) by mouth 2 (two) times daily for 5 days. 12/06/23 12/11/23  Mickie Bail, NP  Cholecalciferol 25 MCG (1000  UT) tablet Take 1,000 Units by mouth daily.    [provider]  cyanocobalamin 1000 MCG tablet Take 1,000 mcg by mouth daily.    [provider]  desvenlafaxine (PRISTIQ) 25 MG 24 hr tablet Take 1 tablet by mouth daily. 06/26/23 06/25/24  [provider]  donepezil (ARICEPT) 10 MG tablet TAKE 1 TABLET BY MOUTH ONCE EVERY EVENING 08/06/21   [provider]  lidocaine-prilocaine (EMLA) cream Apply to affected area once 12/01/22   Jeralyn Ruths, MD  memantine (NAMENDA) 5 MG tablet Take 5 mg by mouth in the morning and at bedtime. 05/10/21   [provider]  omeprazole (PRILOSEC) 40 MG capsule 40 mg daily. 10/29/19   [provider]  ondansetron (ZOFRAN) 8 MG tablet Take 1 tablet (8 mg total) by mouth every 8 (eight) hours as needed for nausea or vomiting. Start on the third day after cisplatin. 12/01/22   Jeralyn Ruths, MD  oxybutynin (DITROPAN) 5  MG tablet Take 1 tablet (5 mg total) by mouth every 8 (eight) hours as needed for bladder spasms. 07/01/23   Vanna Scotland, MD  prochlorperazine (COMPAZINE) 10 MG tablet Take 1 tablet (10 mg total) by mouth every 6 (six) hours as needed (Nausea or vomiting). 12/01/22   Jeralyn Ruths, MD  raloxifene (EVISTA) 60 MG tablet TAKE ONE TABLET BY MOUTH ONCE A DAY 09/19/23   Linzie Collin, MD  rosuvastatin (CRESTOR) 5 MG tablet Take 5 mg by mouth daily.    [provider]    Physical Exam: Vitals:   12/09/23 1203 12/09/23 1215  BP: (!) 155/78 128/86  Pulse: 81 74  Resp: 18   Temp: 98.2 F (36.8 C)   TempSrc: Oral   SpO2: 100%   Weight: 44.9 kg   Height: 4\' 9"  (1.448 m)     Constitutional: NAD, calm, comfortable Vitals:   12/09/23 1203 12/09/23 1215  BP: (!) 155/78 128/86  Pulse: 81 74  Resp: 18   Temp: 98.2 F (36.8 C)   TempSrc: Oral   SpO2: 100%   Weight: 44.9 kg   Height: 4\' 9"  (1.448 m)    Eyes: PERRL, lids and conjunctivae normal ENMT: Mucous membranes are moist. Posterior pharynx clear of any exudate or lesions.Normal dentition.  Neck: normal, supple, no masses, no thyromegaly Respiratory: clear to auscultation bilaterally, no wheezing, no crackles. Normal respiratory effort. No accessory muscle use.  Cardiovascular: Regular rate and rhythm, no murmurs / rubs / gallops. No extremity edema. 2+ pedal pulses. No carotid bruits.  Abdomen: no tenderness, no masses palpated. No hepatosplenomegaly. Bowel sounds positive.  Musculoskeletal: no clubbing / cyanosis. No joint deformity upper and lower extremities. Good ROM, no contractures. Normal muscle tone.  Skin: no rashes, lesions, ulcers. No induration Neurologic: CN 2-12 grossly intact. Sensation intact, DTR normal. Strength 5/5 in all 4.  Psychiatric: Normal judgment and insight. Alert and oriented x 3. Normal mood.     Labs on Admission: I have personally reviewed following labs and imaging  studies  CBC: Recent Labs  Lab 12/09/23 1008  WBC 9.3  HGB 12.5  HCT 38.0  MCV 86.2  PLT 167   Basic Metabolic Panel: Recent Labs  Lab 12/09/23 1008  NA 131*  K 3.7  CL 96*  CO2 22  GLUCOSE 113*  BUN 43*  CREATININE 2.69*  CALCIUM 9.1   GFR: Estimated Creatinine Clearance: 10.5 mL/min (A) (by C-G formula based on SCr of 2.69 mg/dL (H)). Liver Function Tests: No  results for input(s): "AST", "ALT", "ALKPHOS", "BILITOT", "PROT", "ALBUMIN" in the last 168 hours. No results for input(s): "LIPASE", "AMYLASE" in the last 168 hours. No results for input(s): "AMMONIA" in the last 168 hours. Coagulation Profile: No results for input(s): "INR", "PROTIME" in the last 168 hours. Cardiac Enzymes: No results for input(s): "CKTOTAL", "CKMB", "CKMBINDEX", "TROPONINI" in the last 168 hours. BNP (last 3 results) No results for input(s): "PROBNP" in the last 8760 hours. HbA1C: No results for input(s): "HGBA1C" in the last 72 hours. CBG: No results for input(s): "GLUCAP" in the last 168 hours. Lipid Profile: No results for input(s): "CHOL", "HDL", "LDLCALC", "TRIG", "CHOLHDL", "LDLDIRECT" in the last 72 hours. Thyroid Function Tests: No results for input(s): "TSH", "T4TOTAL", "FREET4", "T3FREE", "THYROIDAB" in the last 72 hours. Anemia Panel: No results for input(s): "VITAMINB12", "FOLATE", "FERRITIN", "TIBC", "IRON", "RETICCTPCT" in the last 72 hours. Urine analysis:    Component Value Date/Time   COLORURINE YELLOW 12/09/2023 0954   APPEARANCEUR CLEAR 12/09/2023 0954   APPEARANCEUR Hazy (A) 11/27/2023 0904   LABSPEC 1.020 12/09/2023 0954   PHURINE 5.5 12/09/2023 0954   GLUCOSEU NEGATIVE 12/09/2023 0954   HGBUR TRACE (A) 12/09/2023 0954   BILIRUBINUR NEGATIVE 12/09/2023 0954   BILIRUBINUR small (A) 12/06/2023 1437   BILIRUBINUR Negative 11/27/2023 0904   KETONESUR NEGATIVE 12/09/2023 0954   PROTEINUR 30 (A) 12/09/2023 0954   UROBILINOGEN 0.2 12/06/2023 1437   NITRITE  NEGATIVE 12/09/2023 0954   LEUKOCYTESUR NEGATIVE 12/09/2023 0954    Radiological Exams on Admission: DG Chest 2 View Result Date: 12/09/2023 CLINICAL DATA:  Shortness of breath. EXAM: CHEST - 2 VIEW COMPARISON:  None Available. FINDINGS: Right-sided Port-A-Cath with tip at the cavoatrial junction. No focal consolidation, pleural effusion, or pneumothorax. The cardiac silhouette is within limits. No acute osseous pathology. Degenerative changes of the spine and scoliosis. IMPRESSION: No active cardiopulmonary disease. Electronically Signed   By: Elgie Collard M.D.   On: 12/09/2023 14:50    EKG: Independently reviewed.  Sinus rhythm, chronic RBBB, no acute ST changes.  Assessment/Plan Principal Problem:   AKI (acute kidney injury) (HCC) Active Problems:   Bladder cancer (HCC)  (please populate well all problems here in Problem List. (For example, if patient is on BP meds at home and you resume or decide to hold them, it is a problem that needs to be her. Same for CAD, COPD, HLD and so on)  AKI on CKD stage IV -Patient appears to be euvolemic -Etiology of AKI, clinically suspect Bactrim nephropathy. -Case was discussed with on-call nephrology Dr. Cherylann Ratel, who recommended continue hydration.  And image study to rule out recurrence of renal cancer.  Review of patient recent imaging study showed patient had a CT abdomen pelvis 3 weeks ago which showed no evidence of recurrence of malignancy.  And patient is to receive cystoscopy scope next week.  Will hold off further image study today.  Renal ultrasound is pending. -Continue normal saline -Switch p.o. antibiotics to ceftriaxone for 2 days.  Deconditioning -ED also ordered VQ scan and is pending  History of renal cancer status post left nephrectomy and chemo History of bladder urothelial cancer status post BCG Recurrent hematuria -Cystoscopy scope next week -Outpatient follow-up with oncology  Dementia -Mentation at baseline but  continue Aricept and memantine  DVT prophylaxis: SCD Code Status: Full code Family Communication: Daughter at bedside Disposition Plan: Expect less than 2 midnight hospital stay Consults called: Curbside consult with nephrology Admission status: MedSurg observation   Emeline General MD  [facility] Pager [phone]  12/09/2023, 3:30 PM

## 2023-12-09 NOTE — ED Provider Notes (Signed)
 [facility] Provider Note    Event Date/Time   First MD Initiated Contact with Patient 12/09/23 1211     (approximate)   History   No chief complaint on file.   HPI  Jessica Morrison is a 78 y.o. female past medical history significant for urethral carcinoma of the left kidney, bladder cancer, who presents to the emergency department with acute kidney injury.  Patient states that she was recently evaluated on Friday for not feeling well and was diagnosed with a urinary tract infection.  She was started on Bactrim at that time.  Followed up today with urology and was told that she had a acute kidney injury, cleared her urinary tract infection but that she needed to come to be admitted to the emergency department.  Also endorses 1 to 2 weeks of progressively worsening shortness of breath that is exertional.  Denies any significant chest pain.  No significant cough.  Denies nausea vomiting or diarrhea.  Some decreased p.o. intake.     Physical Exam   Triage Vital Signs: ED Triage Vitals [12/09/23 1203]  Encounter Vitals Group     BP (!) 155/78     Systolic BP Percentile      Diastolic BP Percentile      Pulse Rate 81     Resp 18     Temp 98.2 F (36.8 C)     Temp Source Oral     SpO2 100 %     Weight 99 lb (44.9 kg)     Height 4\' 9"  (1.448 m)     Head Circumference      Peak Flow      Pain Score 0     Pain Loc      Pain Education      Exclude from Growth Chart     Most recent vital signs: Vitals:   12/09/23 1203 12/09/23 1215  BP: (!) 155/78 128/86  Pulse: 81 74  Resp: 18   Temp: 98.2 F (36.8 C)   SpO2: 100%     Physical Exam Constitutional:      Appearance: She is well-developed.  HENT:     Head: Atraumatic.     Mouth/Throat:     Mouth: Mucous membranes are dry.  Eyes:     Conjunctiva/sclera: Conjunctivae normal.  Cardiovascular:     Rate and Rhythm: Regular rhythm.  Pulmonary:     Effort: No respiratory distress.  Abdominal:      General: There is no distension.     Tenderness: There is no abdominal tenderness.  Musculoskeletal:        General: Normal range of motion.     Cervical back: Normal range of motion.  Skin:    General: Skin is warm.     Capillary Refill: Capillary refill takes less than 2 seconds.  Neurological:     Mental Status: She is alert. Mental status is at baseline.     IMPRESSION / MDM / ASSESSMENT AND PLAN / ED COURSE  I reviewed the triage vital signs and the nursing notes.  Differential diagnosis including acute kidney injury, dehydration, malignancy, urinary tract infection, pneumonia, pulmonary embolism  On chart review patient was recently started on Bactrim at urgent care on Friday.  Then switched to Keflex.  Followed up at urology today and had outpatient labs that showed an acute kidney injury with a creatinine elevated at 2.6 from a baseline of 2, did have an elevated BUN at 43.  UA  with did not have any findings of a urinary tract infection.    No tachycardic or bradycardic dysrhythmias while on cardiac telemetry.  RADIOLOGY I independently reviewed imaging, my interpretation of imaging: Chest x-ray with no signs of pneumonia  LABS (all labs ordered are listed, but only abnormal results are displayed) Labs interpreted as -    Labs Reviewed  BRAIN NATRIURETIC PEPTIDE  CK  TROPONIN I (HIGH SENSITIVITY)     MDM    Moderate Wells score, given her exertional dyspnea and malignancy history we will obtain a perfusion study to further evaluate for pulmonary embolism.  Renal ultrasound obtained to evaluate for an obstructive process.  Normal BNP and troponin and no concern for ACS.  VQ scan pending  Given 1 L of IV fluids  Consulted hospitalist for admission for acute kidney injury    PROCEDURES:  Critical Care performed: No  Procedures  Patient's presentation is most consistent with acute complicated illness / injury requiring diagnostic  workup.   MEDICATIONS ORDERED IN ED: Medications  sodium chloride 0.9 % bolus 1,000 mL (has no administration in time range)  sodium chloride 0.9 % bolus 1,000 mL (1,000 mLs Intravenous New Bag/Given 12/09/23 1417)  technetium albumin aggregated (MAA) injection solution 4 millicurie (4.01 millicuries Intravenous Contrast Given 12/09/23 1355)    FINAL CLINICAL IMPRESSION(S) / ED DIAGNOSES   Final diagnoses:  AKI (acute kidney injury) (HCC)     Rx / DC Orders   ED Discharge Orders     None        Note:  This document was prepared using Dragon voice recognition software and may include unintentional dictation errors.   Corena Herter, MD 12/09/23 [phone]

## 2023-12-10 DIAGNOSIS — C679 Malignant neoplasm of bladder, unspecified: Secondary | ICD-10-CM

## 2023-12-10 DIAGNOSIS — N179 Acute kidney failure, unspecified: Secondary | ICD-10-CM | POA: Diagnosis not present

## 2023-12-10 LAB — BASIC METABOLIC PANEL
Anion gap: 9 (ref 5–15)
BUN: 27 mg/dL — ABNORMAL HIGH (ref 8–23)
CO2: 19 mmol/L — ABNORMAL LOW (ref 22–32)
Calcium: 8.4 mg/dL — ABNORMAL LOW (ref 8.9–10.3)
Chloride: 109 mmol/L (ref 98–111)
Creatinine, Ser: 1.91 mg/dL — ABNORMAL HIGH (ref 0.44–1.00)
GFR, Estimated: 27 mL/min — ABNORMAL LOW (ref >60)
Glucose, Bld: 90 mg/dL (ref 70–99)
Potassium: 3.9 mmol/L (ref 3.5–5.1)
Sodium: 137 mmol/L (ref 135–145)

## 2023-12-10 MED ORDER — CEPHALEXIN 500 MG PO CAPS
500.0000 mg | ORAL_CAPSULE | ORAL | Status: DC
  Administered 2023-12-10: 500 mg via ORAL
  Filled 2023-12-10: qty 1

## 2023-12-10 MED ORDER — HEPARIN SOD (PORK) LOCK FLUSH 100 UNIT/ML IV SOLN
500.0000 [IU] | Freq: Once | INTRAVENOUS | Status: AC
  Administered 2023-12-10: 500 [IU] via INTRAVENOUS
  Filled 2023-12-10: qty 5

## 2023-12-10 MED ORDER — CEPHALEXIN 500 MG PO CAPS: 500.0000 mg | ORAL_CAPSULE | Freq: Every day | ORAL | 0 refills | Status: DC

## 2023-12-10 NOTE — Plan of Care (Signed)
  Problem: Clinical Measurements: Goal: Will remain free from infection Outcome: Progressing Goal: Diagnostic test results will improve Outcome: Progressing Goal: Respiratory complications will improve Outcome: Progressing Goal: Cardiovascular complication will be avoided Outcome: Progressing   Problem: Education: Goal: Knowledge of General Education information will improve Description: Including pain rating scale, medication(s)/side effects and non-pharmacologic comfort measures Outcome: Not Progressing

## 2023-12-10 NOTE — Discharge Summary (Signed)
 Physician Discharge Summary   Patient: Jessica Morrison MRN: [id number] DOB: 1946-03-18  Admit date:     12/09/2023  Discharge date: 12/10/23  Discharge Physician: Enedina Finner   PCP: Marguarite Arbour, MD   Recommendations at discharge:   follow-up with urology PA Ilona Sorrel at 9:40 AM next Monday for preop before her bladder surgery follow-up PCP in 1 to 2 weeks  Discharge Diagnoses: Principal Problem:   AKI (acute kidney injury) (HCC) Active Problems:   Bladder cancer (HCC)   Jessica Morrison is a 78 y.o. 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  Patient started develop dysuria and went to see urology early last week, when she was diagnosed with UTI, urine culture showed pansensitive E. coli and patient was started on Bactrim twice daily. Patient took Bactrim Tuesday Wednesday and Thursday and Friday morning, patient developed large amount of hematuria and went to see urgent care Friday evening, Bactrim was discontinued and patient started on 5 days course of Keflex  Today she went to see urology, blood work in the office showed AKI on CKD creatinine 2.69 compared to baseline 1.8-2.0.   AK I on CKD stage IV -- recently diagnosed with E. coli UTI started on Bactrim, poor PO intake -- patient received IV fluids creatinine much improved. Patient tolerating PO as well. -- Defer PCP to send patient to nephrology follow-up as outpatient if creatinine continues to rise again -- renal ultrasound  Prior left nephrectomy. 2. Mild right hydronephrosis, which persists after voiding. No urinary tract calculi are identified. 3. 1.2 cm hypodense mass within the left posterolateral aspect of the bladder. Further evaluation with  cystoscopy may be useful.  Known history of high grade urothelial bladder mass -- patient follows with urology in [place]. She is scheduled for procedure next week -- follow-up urology on your appointment  History of renal cancer status post left nephrectomy and status post chemo in the past  Dementia -- continue home meds  Hyperlipidemia continue statins  Overall improving. Discussed with urology PA who has scheduled patient to be seen next Monday for preop for upcoming bladder procedure. Discussed discharge plan with patient's daughter in the room and patient they both are in agreement      Disposition: Home Diet recommendation:  Discharge Diet Orders (From admission, onward)     Start     Ordered   12/10/23 0000  Diet - low sodium heart healthy        12/10/23 1014           Cardiac diet DISCHARGE MEDICATION: Allergies as of 12/10/2023       Reactions   Codeine Nausea And Vomiting   Neosporin Wound Cleanser [benzalkonium Chloride] Other (See Comments)   Skin blisters   Penicillins Hives        Medication List     TAKE these medications    acetaminophen 325 MG tablet Commonly known as: TYLENOL Take 650 mg by mouth every 6 (six) hours as needed.   calcium-vitamin D 500-200 MG-UNIT tablet Commonly known as: OSCAL WITH D 1 tablet daily with breakfast.   cephALEXin 500 MG capsule Commonly known as: KEFLEX Take 1 capsule (500 mg total) by mouth daily. What changed: when to take this   Cholecalciferol 25 MCG (1000 UT) tablet  Take 1,000 Units by mouth daily.   cyanocobalamin 1000 MCG tablet Take 1,000 mcg by mouth daily.   desvenlafaxine 25 MG 24 hr tablet Commonly known as: PRISTIQ Take 1 tablet by mouth daily.   donepezil 10 MG tablet Commonly known as: ARICEPT TAKE 1 TABLET BY MOUTH ONCE EVERY EVENING   lidocaine-prilocaine cream Commonly known as: EMLA Apply to affected area once   memantine 5 MG tablet Commonly known as: NAMENDA Take  5 mg by mouth in the morning and at bedtime.   omeprazole 40 MG capsule Commonly known as: PRILOSEC 40 mg daily.   ondansetron 8 MG tablet Commonly known as: Zofran Take 1 tablet (8 mg total) by mouth every 8 (eight) hours as needed for nausea or vomiting. Start on the third day after cisplatin.   oxybutynin 5 MG tablet Commonly known as: DITROPAN Take 1 tablet (5 mg total) by mouth every 8 (eight) hours as needed for bladder spasms.   prochlorperazine 10 MG tablet Commonly known as: COMPAZINE Take 1 tablet (10 mg total) by mouth every 6 (six) hours as needed (Nausea or vomiting).   raloxifene 60 MG tablet Commonly known as: EVISTA TAKE ONE TABLET BY MOUTH ONCE A DAY   rosuvastatin 5 MG tablet Commonly known as: CRESTOR Take 5 mg by mouth daily.        Follow-up Information     Sparks, Duane Lope, MD Follow up.   Specialty: Internal Medicine Why: Hospital follow up Contact information: [address] [place] [address] [phone]         Harle Battiest, PA-C. Go on 12/16/2023.   Specialty: Urology Why: at 9:40 am Contact information: [address] [place] [address] [phone]                Discharge Exam: Ceasar Mons Weights   12/09/23 1203  Weight: 44.9 kg   Alert and oriented times three expiratory clear to auscultation cardiovascular both heart sounds normal neuro- grossly intact  Condition at discharge: fair  The results of significant diagnostics from this hospitalization (including imaging, microbiology, ancillary and laboratory) are listed below for reference.   Imaging Studies: NM Pulmonary Perfusion Result Date: 12/09/2023 CLINICAL DATA:  Pulmonary embolism (PE) suspected, high prob EXAM: NUCLEAR MEDICINE PERFUSION LUNG SCAN TECHNIQUE: Perfusion images were obtained in multiple projections after intravenous injection of radiopharmaceutical. Ventilation scans intentionally deferred if perfusion scan and chest  x-ray adequate for interpretation during COVID 19 epidemic. RADIOPHARMACEUTICALS:  4.0 mCi Tc-42m MAA IV COMPARISON:  X-ray 12/09/2023 FINDINGS: Homogeneous distribution of radiotracer throughout both lungs. No perfusion defects. IMPRESSION: No scintigraphic evidence of pulmonary embolism. Electronically Signed   By: Duanne Guess D.O.   On: 12/09/2023 15:33   US RENAL Result Date: 12/09/2023 CLINICAL DATA:  Acute kidney injury EXAM: RENAL / URINARY TRACT ULTRASOUND COMPLETE COMPARISON:  11/11/2023 FINDINGS: Right Kidney: Renal measurements: 8.9 x 3.4 x 4.5 cm = volume: 70.8 mL. Normal renal cortical echotexture. There is mild right-sided hydronephrosis, which persists after voiding. No renal mass or urinary tract calculi. Left Kidney: Surgically absent. Bladder: The bladder is moderately distended, with a prevoid volume of 120 cc. Postvoid residual measures 2 cc. There is a rounded hypoechoic mass along the left posterolateral wall the bladder, measuring 1.1 x 0.7 x 1.2 cm. Other: None. IMPRESSION: 1. Prior left nephrectomy. 2. Mild right hydronephrosis, which persists after voiding. No urinary tract calculi are identified. 3. 1.2 cm hypodense mass within the left posterolateral aspect of the  bladder. Further evaluation with cystoscopy may be useful. Electronically Signed   By: Sharlet Salina M.D.   On: 12/09/2023 15:21   DG Chest 2 View Result Date: 12/09/2023 CLINICAL DATA:  Shortness of breath. EXAM: CHEST - 2 VIEW COMPARISON:  None Available. FINDINGS: Right-sided Port-A-Cath with tip at the cavoatrial junction. No focal consolidation, pleural effusion, or pneumothorax. The cardiac silhouette is within limits. No acute osseous pathology. Degenerative changes of the spine and scoliosis. IMPRESSION: No active cardiopulmonary disease. Electronically Signed   By: Elgie Collard M.D.   On: 12/09/2023 14:50   CT ABDOMEN PELVIS WO CONTRAST Result Date: 11/18/2023 CLINICAL DATA:  Restaging urothelial  carcinoma * Tracking Code: BO * EXAM: CT ABDOMEN AND PELVIS WITHOUT CONTRAST TECHNIQUE: Multidetector CT imaging of the abdomen and pelvis was performed following the standard protocol without IV contrast. RADIATION DOSE REDUCTION: This exam was performed according to the departmental dose-optimization program which includes automated exposure control, adjustment of the mA and/or kV according to patient size and/or use of iterative reconstruction technique. COMPARISON:  07/31/2023 FINDINGS: Lower chest: Descending thoracic aortic atherosclerosis. Mild tricuspid valve calcification. Minimal pectus excavatum, Haller index 2.6. Small eventration of the left posterior hemidiaphragm. Hepatobiliary: Contracted gallbladder noted. Otherwise unremarkable. Pancreas: Unremarkable Spleen: Unremarkable Adrenals/Urinary Tract: Prior left nephrectomy. Indistinct left adrenal gland. The right kidney appears unremarkable. No urinary tract calculi. No obvious tumor along the urinary bladder on noncontrast images. Stomach/Bowel: Prominent stool throughout the colon favors constipation. Vascular/Lymphatic: Atherosclerosis is present, including aortoiliac atherosclerotic disease. No pathologic adenopathy observed. Reproductive: Uterus absent.  Adnexa unremarkable. Other: Benign-appearing laminated 3.8 cm calcification in the right posterior pelvis adjacent to the upper rectum. Likely a peritoneal loose body and considered benign. Musculoskeletal: Dextroconvex lumbar scoliosis with rotary component. IMPRESSION: 1. No findings of active malignancy. 2. Prominent stool throughout the colon favors constipation. 3. Aortic atherosclerosis. 4. Dextroconvex lumbar scoliosis with rotary component. Aortic Atherosclerosis (ICD10-I70.0). Electronically Signed   By: Gaylyn Rong M.D.   On: 11/18/2023 10:11    Microbiology: Results for orders placed or performed in visit on 11/27/23  Microscopic Examination     Status: Abnormal    Collection Time: 11/27/23  9:04 AM   Urine  Result Value Ref Range Status   WBC, UA 11-30 (A) 0 - 5 /hpf Final   RBC, Urine 11-30 (A) 0 - 2 /hpf Final   Epithelial Cells (non renal) 0-10 0 - 10 /hpf Final   Casts Present (A) None seen /lpf Final   Cast Type Hyaline casts N/A Final   Mucus, UA Present (A) Not Estab. Final   Bacteria, UA Many (A) None seen/Few Final  CULTURE, URINE COMPREHENSIVE     Status: Abnormal   Collection Time: 11/27/23 12:56 PM   Specimen: Urine   UR  Result Value Ref Range Status   Urine Culture, Comprehensive Final report (A)  Final   Organism ID, Bacteria Escherichia coli (A)  Final    Comment: Cefazolin with an MIC <=16 predicts susceptibility to the oral agents cefaclor, cefdinir, cefpodoxime, cefprozil, cefuroxime, cephalexin, and loracarbef when used for therapy of uncomplicated urinary tract infections due to E. coli, Klebsiella pneumoniae, and Proteus mirabilis. Greater than 100,000 colony forming units per mL    ANTIMICROBIAL SUSCEPTIBILITY Comment  Final    Comment:       ** S = Susceptible; I = Intermediate; R = Resistant **  P = Positive; N = Negative             MICS are expressed in micrograms per mL    Antibiotic                 RSLT#1    RSLT#2    RSLT#3    RSLT#4 Amoxicillin/Clavulanic Acid    S Ampicillin                     S Cefazolin                      S Cefepime                       S Cefoxitin                      S Cefpodoxime                    S Ceftriaxone                    S Ciprofloxacin                  S Ertapenem                      S Gentamicin                     S Levofloxacin                   S Meropenem                      S Nitrofurantoin                 S Piperacillin/Tazobactam        S Tetracycline                   S Tobramycin                     S Trimethoprim/Sulfa             S     Labs: CBC: Recent Labs  Lab 12/09/23 1008  WBC 9.3  HGB 12.5  HCT 38.0  MCV 86.2  PLT  167   Basic Metabolic Panel: Recent Labs  Lab 12/09/23 1008 12/10/23 0443  NA 131* 137  K 3.7 3.9  CL 96* 109  CO2 22 19*  GLUCOSE 113* 90  BUN 43* 27*  CREATININE 2.69* 1.91*  CALCIUM 9.1 8.4*   L Discharge time spent: greater than 30 minutes.  Signed: Enedina Finner, MD [facility] 12/10/2023

## 2023-12-13 NOTE — Progress Notes (Signed)
 12/16/2023 10:35 AM   Jessica Morrison 12-15-1945 [id number]  Referring provider: Marguarite Arbour, MD [address] [facility] [place],  [place] [address]  Urological history: 1. High risk hematuria -non-smoker -CTU (09/2022) - 3.8 complex cystic lesion in upper left pole kidney -cysto/left URS (09/2022) - papillary tumor superior infundibulum/calyx - high grade urothelial carcinoma   2. 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 Bladder cancer -TURBT (06/2023) multifocal high grad urothelial w/ invasion into lamina propria  -completed induction BCG (09/2023)   Chief Complaint  Patient presents with   Pre-op Exam   HPI: Jessica Morrison is a 78 y.o. female who presents today for follow up after hospitalization with her daughter, Jessica Morrison.   Previous records reviewed.   She is scheduled for a TURBT on March 10th, 2025.  Her preoperative urine culture was positive for E.coli and she was instructed to start Bactrim 5 days prior to her TURBT.   She started to experience UTI symptoms, so she started the Bactrim last week.  Her daughter contacted the office on Friday, stating that her mother was doing worse and developed gross heme.   She was seen at  [facility] urgent care in [place] and they switched her to Keflex. 500 gm twice daily.    At her visit on 12/09/2023, she still feels like her bladder is full and that she cannot empty it.  She continues to have intermittent gross hematuria.  She has had some nausea and dry heaves.  She has denied any fever or chills.  She has been "as weak as water."  Patient denies any modifying or aggravating factors.  Patient denies any flank pain.  PVR 34 mL.   UA clear, specific gravity 1.020, pH 5.5, trace hemoglobin,  30 protein, 0-5 squames, 0-5 WBCs, 11-20 RBC's, casts present and granular casts present.  BMP serum creatinine has increased to 2.69 from 1.87 one month ago.  CBC normal.  She then was admitted for 2 days to treat her AKI.  Her daughter states that she still is weak and her memory has gotten worse.  Her vital signs are normal today and she is satting well.  She had PE ruled out while she was hospitalized along with any cardiac etiology for her symptoms.  Patient denies any modifying or aggravating factors.  Patient denies any recent UTI's, gross hematuria, dysuria or suprapubic/flank pain.  Patient denies any fevers, chills, nausea or vomiting.    UA yellow clear, specific area 1.025, pH 5.5, trace heme, 100 protein, 0-5 squamous epithelial cells, 6-10 WBCs, 6-10 RBCs, many bacteria and granular casts present.  PMH: Past Medical History:  Diagnosis Date   B12 deficiency    Back pain    Basal cell carcinoma 08/04/2020   0.5cm above the left mid brow, EDC 09/15/20   Cancer (HCC)    Melanoma   Curvature of spine    Dysplastic nevus 04/30/2013   left post shoulder, left parasternal   Dysplastic nevus 05/04/2014   right prox ant deltoid, left lat buttock   Dysplastic nevus 03/26/2019   right lat neck adjacent to the inf top of MM in situ site scar   Dysplastic nevus 10/29/2019   upper back left paraspinal at level of mid scapula   Dysplastic nevus 03/31/2020, 05/12/20 shave removal  Left mid pretibial. Moderate to severe atypia, deep margin involved.   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   right lat neck inf to right angle of mandible, excised: 07/29/2018,  margins free   Memory loss    Mild mitral regurgitation by prior echocardiogram    Nephrolithiasis    Osteoarthritis    Osteopenia    Osteoporosis    Pelvic pain in female    PONV (postoperative nausea and vomiting)    Pre-diabetes    Raynaud disease    Renal cancer, left (HCC) 03/2023   Thyromegaly    Transitional cell carcinoma of kidney, left (HCC)    Urothelial carcinoma of kidney, left (HCC)     Surgical History: Past Surgical History:  Procedure Laterality Date   ABDOMINAL HYSTERECTOMY     BILATERAL OOPHORECTOMY  1999   BLADDER INSTILLATION N/A 04/01/2023   Procedure: BLADDER INSTILLATION OF GEMCITABINE;  Surgeon: Vanna Scotland, MD;  Location: [facility];  Service: Urology;  Laterality: N/A;   BLADDER INSTILLATION N/A 07/01/2023   Procedure: BLADDER INSTILLATION OF GEMCITABINE;  Surgeon: Vanna Scotland, MD;  Location: [facility];  Service: Urology;  Laterality: N/A;   CATARACT EXTRACTION W/PHACO Right 05/09/2023   Procedure: CATARACT EXTRACTION PHACO AND INTRAOCULAR LENS PLACEMENT (IOC) RIGHT MALYUGIN OMIDRIA 7.79 00:59.0;  Surgeon: Estanislado Pandy, MD;  Location[facility];  Service: Ophthalmology;  Laterality: Right;   CATARACT EXTRACTION W/PHACO Left 05/23/2023   Procedure: CATARACT EXTRACTION PHACO AND INTRAOCULAR LENS PLACEMENT (IOC) LEFT MALYUGIN OMIDRIA 12.13 1:23.6;  Surgeon: Estanislado Pandy, MD;  Location: [facility];  Service: Ophthalmology;  Laterality: Left;   CESAREAN SECTION     COLONOSCOPY  09/02/2020   COLONOSCOPY WITH PROPOFOL N/A 05/09/2015   Procedure: COLONOSCOPY WITH PROPOFOL;  Surgeon: Elnita Maxwell, MD;  Location: [facility];  Service: Endoscopy;  Laterality: N/A;   CYSTOSCOPY W/ RETROGRADES Right 07/01/2023   Procedure: CYSTOSCOPY WITH RETROGRADE PYELOGRAM;  Surgeon: Vanna Scotland, MD;  Location: [facility];  Service: Urology;  Laterality: Right;   CYSTOSCOPY WITH RETROGRADE PYELOGRAM, URETEROSCOPY AND STENT  PLACEMENT Left 10/09/2022   Procedure: CYSTOSCOPY WITH RETROGRADE PYELOGRAM, URETEROSCOPY AND STENT PLACEMENT;  Surgeon: Riki Altes, MD;  Location: [facility];  Service: Urology;  Laterality: Left;   IR IMAGING GUIDED PORT INSERTION  12/06/2022   MELANOMA EXCISION Right 07/29/2018   lateral neck inf. to right angle of mandible   OOPHORECTOMY     ROBOT ASSITED LAPAROSCOPIC NEPHROURETERECTOMY Left 04/01/2023   Procedure: XI ROBOT ASSITED LAPAROSCOPIC NEPHROURETERECTOMY;  Surgeon: Vanna Scotland, MD;  Location: [facility];  Service: Urology;  Laterality: Left;   TRANSURETHRAL RESECTION OF BLADDER TUMOR N/A 07/01/2023   Procedure: TRANSURETHRAL RESECTION OF BLADDER TUMOR (TURBT);  Surgeon: Vanna Scotland, MD;  Location: [facility];  Service: Urology;  Laterality: N/A;   URETERAL BIOPSY Left 10/09/2022   Procedure: URETERAL BIOPSY;  Surgeon: Riki Altes, MD;  Location: [facility];  Service: Urology;  Laterality: Left;    Home Medications:  Allergies as of 12/16/2023       Reactions   Codeine Nausea And Vomiting  Neosporin Wound Cleanser [benzalkonium Chloride] Other (See Comments)   Skin blisters   Penicillins Hives        Medication List        Accurate as of December 16, 2023 10:35 AM. If you have any questions, ask your nurse or doctor.          STOP taking these medications    cephALEXin 500 MG capsule Commonly known as: KEFLEX Stopped by: Shanesha Bednarz       TAKE these medications    acetaminophen 325 MG tablet Commonly known as: TYLENOL Take 650 mg by mouth every 6 (six) hours as needed for moderate pain (pain score 4-6).   calcium-vitamin D 500-200 MG-UNIT tablet Commonly known as: OSCAL WITH D 1 tablet daily with breakfast.   Cholecalciferol 25 MCG (1000 UT) tablet Take 1,000 Units by mouth daily.   cyanocobalamin 1000 MCG tablet Take 1,000 mcg by mouth daily.   desvenlafaxine 25 MG 24 hr tablet Commonly known as: PRISTIQ Take 25 mg by mouth daily.    donepezil 10 MG tablet Commonly known as: ARICEPT Take 10 mg by mouth every evening.   lidocaine-prilocaine cream Commonly known as: EMLA Apply to affected area once What changed:  how much to take how to take this when to take this reasons to take this   memantine 5 MG tablet Commonly known as: NAMENDA Take 5 mg by mouth in the morning and at bedtime.   omeprazole 40 MG capsule Commonly known as: PRILOSEC 40 mg daily.   ondansetron 8 MG tablet Commonly known as: Zofran Take 1 tablet (8 mg total) by mouth every 8 (eight) hours as needed for nausea or vomiting. Start on the third day after cisplatin.   oxybutynin 5 MG tablet Commonly known as: DITROPAN Take 1 tablet (5 mg total) by mouth every 8 (eight) hours as needed for bladder spasms.   prochlorperazine 10 MG tablet Commonly known as: COMPAZINE Take 1 tablet (10 mg total) by mouth every 6 (six) hours as needed (Nausea or vomiting).   raloxifene 60 MG tablet Commonly known as: EVISTA TAKE ONE TABLET BY MOUTH ONCE A DAY   rosuvastatin 5 MG tablet Commonly known as: CRESTOR Take 5 mg by mouth daily.        Allergies:  Allergies  Allergen Reactions   Codeine Nausea And Vomiting   Neosporin Wound Cleanser [Benzalkonium Chloride] Other (See Comments)    Skin blisters   Penicillins Hives    Family History: Family History  Problem Relation Age of Onset   Emphysema Father    Cancer Neg Hx    Heart disease Neg Hx    Diabetes Neg Hx    Breast cancer Neg Hx     Social History:  reports that she has never smoked. She has never been exposed to tobacco smoke. She has never used smokeless tobacco. She reports that she does not drink alcohol and does not use drugs.  ROS: Pertinent ROS in HPI  Physical Exam: BP 136/74 (BP Location: Left Arm, Patient Position: Sitting, Cuff Size: Normal)   Pulse 92   Ht 4\' 9"  (1.448 m)   Wt 99 lb 6.4 oz (45.1 kg)   SpO2 99%   BMI 21.51 kg/m   Constitutional:  Well  nourished. Alert and oriented, No acute distress. HEENT: [place] AT, moist mucus membranes.  Trachea midline, no masses. Cardiovascular: No clubbing, cyanosis, or edema. Respiratory: Normal respiratory effort, no increased work of breathing. Neurologic: Grossly intact, no focal deficits,  moving all 4 extremities. Psychiatric: Normal mood and affect.    Laboratory Data: Lab Results  Component Value Date   WBC 9.3 12/09/2023   HGB 12.5 12/09/2023   HCT 38.0 12/09/2023   MCV 86.2 12/09/2023   PLT 167 12/09/2023    Lab Results  Component Value Date   CREATININE 1.91 (H) 12/10/2023    Lab Results  Component Value Date   AST 20 11/11/2023   Lab Results  Component Value Date   ALT 13 11/11/2023   Urinalysis See EPIC and HPI  I have reviewed the labs   Pertinent Imaging: N/A  Assessment & Plan:   1. E.coli UTI -UA w/ bacteriuria -Urine culture pending  2. Solitary kidney w/AKI -Will need to be cautious with prescribing antibiotics  3. Left kidney urothelial carcinoma  -s/p left nephroureterectomy   4. Bladder cancer -scheduled for TURBT 03/10   Return for Surgery 12/23/2023 .  These notes generated with voice recognition software. I apologize for typographical errors.  Cloretta Ned  [facility] [address]  [address] [place], [place] [address] [phone]

## 2023-12-16 ENCOUNTER — Other Ambulatory Visit
Admission: RE | Admit: 2023-12-16 | Discharge: 2023-12-16 | Disposition: A | Discharge status: Home / Self Care | Attending: Urology | Admitting: Urology

## 2023-12-16 ENCOUNTER — Ambulatory Visit: Payer: PPO | Admitting: Urology

## 2023-12-16 ENCOUNTER — Other Ambulatory Visit: Payer: Self-pay

## 2023-12-16 ENCOUNTER — Encounter: Payer: Self-pay | Admitting: Urology

## 2023-12-16 ENCOUNTER — Encounter
Admission: RE | Admit: 2023-12-16 | Discharge: 2023-12-16 | Disposition: A | Discharge status: Home / Self Care | Payer: PPO | Source: Ambulatory Visit | Attending: Urology | Admitting: Urology

## 2023-12-16 VITALS — BP 136/74 | HR 92 | Ht <= 58 in | Wt 99.4 lb

## 2023-12-16 DIAGNOSIS — D494 Neoplasm of unspecified behavior of bladder: Secondary | ICD-10-CM

## 2023-12-16 DIAGNOSIS — Z01818 Encounter for other preprocedural examination: Secondary | ICD-10-CM

## 2023-12-16 DIAGNOSIS — N390 Urinary tract infection, site not specified: Secondary | ICD-10-CM | POA: Diagnosis not present

## 2023-12-16 DIAGNOSIS — N179 Acute kidney failure, unspecified: Secondary | ICD-10-CM | POA: Diagnosis not present

## 2023-12-16 DIAGNOSIS — Z85528 Personal history of other malignant neoplasm of kidney: Secondary | ICD-10-CM

## 2023-12-16 DIAGNOSIS — R3129 Other microscopic hematuria: Secondary | ICD-10-CM | POA: Diagnosis not present

## 2023-12-16 DIAGNOSIS — Z905 Acquired absence of kidney: Secondary | ICD-10-CM

## 2023-12-16 DIAGNOSIS — C689 Malignant neoplasm of urinary organ, unspecified: Secondary | ICD-10-CM

## 2023-12-16 DIAGNOSIS — B9620 Unspecified Escherichia coli [E. coli] as the cause of diseases classified elsewhere: Secondary | ICD-10-CM | POA: Diagnosis not present

## 2023-12-16 HISTORY — DX: Dyspnea, unspecified: R06.00

## 2023-12-16 LAB — URINALYSIS, COMPLETE (UACMP) WITH MICROSCOPIC
Bilirubin Urine: NEGATIVE
Glucose, UA: NEGATIVE mg/dL
Ketones, ur: NEGATIVE mg/dL
Leukocytes,Ua: NEGATIVE
Nitrite: NEGATIVE
Protein, ur: 100 mg/dL — AB
Specific Gravity, Urine: 1.025 (ref 1.005–1.030)
pH: 5.5 (ref 5.0–8.0)

## 2023-12-16 NOTE — Addendum Note (Signed)
 Addended by: Caroline Sauger on: 12/16/2023 09:43 AM   Modules accepted: Orders

## 2023-12-16 NOTE — Patient Instructions (Addendum)
 Your procedure is scheduled on: Monday 12/23/23 Report to the Registration Desk on the 1st floor of the [facility]. To find out your arrival time, please call [phone] between 1PM - 3PM on: Friday 12/20/23 If your arrival time is 6:00 am, do not arrive before that time as the [facility] entrance doors do not open until 6:00 am.  REMEMBER: Instructions that are not followed completely may result in serious medical risk, up to and including death; or upon the discretion of your surgeon and anesthesiologist your surgery may need to be rescheduled.  Do not eat food or drink fluids after midnight the night before surgery.  No gum chewing or hard candies.   One week prior to surgery: Stop Anti-inflammatories (NSAIDS) such as Advil, Aleve, Ibuprofen, Motrin, Naproxen, Naprosyn and Aspirin based products such as Excedrin, Goody's Powder, BC Powder. Stop ANY OVER THE COUNTER supplements until after surgery.  You may however, continue to take Tylenol if needed for pain up until the day of surgery.   Continue taking all of your other prescription medications up until the day of surgery.  ON THE DAY OF SURGERY ONLY TAKE THESE MEDICATIONS WITH SIPS OF WATER:  desvenlafaxine (PRISTIQ) 25 MG  memantine (NAMENDA) 5 MG  omeprazole (PRILOSEC) 40 MG    No Alcohol for 24 hours before or after surgery.  No Smoking including e-cigarettes for 24 hours before surgery.  No chewable tobacco products for at least 6 hours before surgery.  No nicotine patches on the day of surgery.  Do not use any "recreational" drugs for at least a week (preferably 2 weeks) before your surgery.  Please be advised that the combination of cocaine and anesthesia may have negative outcomes, up to and including death. If you test positive for cocaine, your surgery will be cancelled.  On the morning of surgery brush your teeth with toothpaste and water, you may rinse your mouth with mouthwash if you wish. Do not swallow  any toothpaste or mouthwash.  Use CHG Soap or wipes as directed on instruction sheet.  Do not wear jewelry, make-up, hairpins, clips or nail polish.  For welded (permanent) jewelry: bracelets, anklets, waist bands, etc.  Please have this removed prior to surgery.  If it is not removed, there is a chance that hospital personnel will need to cut it off on the day of surgery.  Do not wear lotions, powders, or perfumes.   Do not shave body hair from the neck down 48 hours before surgery.  Contact lenses, hearing aids and dentures may not be worn into surgery.  Do not bring valuables to the hospital. [facility] is not responsible for any missing/lost belongings or valuables.   Notify your doctor if there is any change in your medical condition (cold, fever, infection).  Wear comfortable clothing (specific to your surgery type) to the hospital.  After surgery, you can help prevent lung complications by doing breathing exercises.  Take deep breaths and cough every 1-2 hours. Your doctor may order a device called an Incentive Spirometer to help you take deep breaths. When coughing or sneezing, hold a pillow firmly against your incision with both hands. This is called "splinting." Doing this helps protect your incision. It also decreases belly discomfort.  If you are being admitted to the hospital overnight, leave your suitcase in the car. After surgery it may be brought to your room.  In case of increased patient census, it may be necessary for you, the patient, to continue your  postoperative care in the Same Day Surgery department.  If you are being discharged the day of surgery, you will not be allowed to drive home. You will need a responsible individual to drive you home and stay with you for 24 hours after surgery.   If you are taking public transportation, you will need to have a responsible individual with you.  Please call the Pre-admissions Testing Dept. at [phone] if you  have any questions about these instructions.  Surgery Visitation Policy:  Patients having surgery or a procedure may have two visitors.  Children under the age of 23 must have an adult with them who is not the patient.  Temporary Visitor Restrictions Due to increasing cases of flu, RSV and COVID-19: Children ages 3 and under will not be able to visit patients in [facility] hospitals under most circumstances.  Inpatient Visitation:    Visiting hours are 7 a.m. to 8 p.m. Up to four visitors are allowed at one time in a patient room. The visitors may rotate out with other people during the day.  One visitor age 5 or older may stay with the patient overnight and must be in the room by 8 p.m.

## 2023-12-18 ENCOUNTER — Other Ambulatory Visit: Payer: Self-pay | Admitting: Urology

## 2023-12-18 LAB — URINE CULTURE: Culture: 20000 — AB

## 2023-12-18 MED ORDER — FOSFOMYCIN TROMETHAMINE 3 G PO PACK: 3.0000 g | PACK | Freq: Once | ORAL | 3 refills | Status: AC

## 2023-12-20 ENCOUNTER — Other Ambulatory Visit: Payer: Self-pay | Admitting: Urology

## 2023-12-20 MED ORDER — FOSFOMYCIN TROMETHAMINE 3 G PO PACK: 3.0000 g | PACK | Freq: Once | ORAL | 0 refills | Status: AC

## 2023-12-23 ENCOUNTER — Ambulatory Visit: Payer: Self-pay | Admitting: Urgent Care

## 2023-12-23 ENCOUNTER — Encounter
Admission: RE | Disposition: A | Discharge status: Home / Self Care | Payer: Self-pay | Source: Home / Self Care | Attending: Urology

## 2023-12-23 ENCOUNTER — Encounter: Payer: Self-pay | Admitting: Urology

## 2023-12-23 ENCOUNTER — Ambulatory Visit: Admitting: Anesthesiology

## 2023-12-23 ENCOUNTER — Ambulatory Visit
Admission: RE | Admit: 2023-12-23 | Discharge: 2023-12-23 | Disposition: A | Discharge status: Home / Self Care | Payer: PPO | Attending: Urology | Admitting: Urology

## 2023-12-23 ENCOUNTER — Other Ambulatory Visit: Payer: Self-pay

## 2023-12-23 DIAGNOSIS — I340 Nonrheumatic mitral (valve) insufficiency: Secondary | ICD-10-CM | POA: Diagnosis not present

## 2023-12-23 DIAGNOSIS — I509 Heart failure, unspecified: Secondary | ICD-10-CM | POA: Insufficient documentation

## 2023-12-23 DIAGNOSIS — K219 Gastro-esophageal reflux disease without esophagitis: Secondary | ICD-10-CM | POA: Diagnosis not present

## 2023-12-23 DIAGNOSIS — D414 Neoplasm of uncertain behavior of bladder: Secondary | ICD-10-CM | POA: Diagnosis not present

## 2023-12-23 DIAGNOSIS — Z85528 Personal history of other malignant neoplasm of kidney: Secondary | ICD-10-CM | POA: Diagnosis not present

## 2023-12-23 DIAGNOSIS — C678 Malignant neoplasm of overlapping sites of bladder: Secondary | ICD-10-CM | POA: Insufficient documentation

## 2023-12-23 DIAGNOSIS — N189 Chronic kidney disease, unspecified: Secondary | ICD-10-CM | POA: Insufficient documentation

## 2023-12-23 DIAGNOSIS — Z8551 Personal history of malignant neoplasm of bladder: Secondary | ICD-10-CM | POA: Diagnosis present

## 2023-12-23 DIAGNOSIS — C674 Malignant neoplasm of posterior wall of bladder: Secondary | ICD-10-CM

## 2023-12-23 DIAGNOSIS — N3080 Other cystitis without hematuria: Secondary | ICD-10-CM | POA: Insufficient documentation

## 2023-12-23 DIAGNOSIS — F0390 Unspecified dementia without behavioral disturbance: Secondary | ICD-10-CM | POA: Insufficient documentation

## 2023-12-23 DIAGNOSIS — I130 Hypertensive heart and chronic kidney disease with heart failure and stage 1 through stage 4 chronic kidney disease, or unspecified chronic kidney disease: Secondary | ICD-10-CM | POA: Insufficient documentation

## 2023-12-23 DIAGNOSIS — D494 Neoplasm of unspecified behavior of bladder: Secondary | ICD-10-CM

## 2023-12-23 HISTORY — PX: TRANSURETHRAL RESECTION OF BLADDER TUMOR: SHX2575

## 2023-12-23 SURGERY — TURBT (TRANSURETHRAL RESECTION OF BLADDER TUMOR)
Anesthesia: General | Site: Bladder

## 2023-12-23 MED ORDER — ORAL CARE MOUTH RINSE: 15.0000 mL | Freq: Once | OROMUCOSAL | Status: AC

## 2023-12-23 MED ORDER — CHLORHEXIDINE GLUCONATE 0.12 % MT SOLN
15.0000 mL | Freq: Once | OROMUCOSAL | Status: AC
  Administered 2023-12-23: 15 mL via OROMUCOSAL

## 2023-12-23 MED ORDER — CEFAZOLIN SODIUM-DEXTROSE 2-4 GM/100ML-% IV SOLN
2.0000 g | INTRAVENOUS | Status: AC
Start: 2023-12-23 — End: 2023-12-23
  Administered 2023-12-23: 1 g via INTRAVENOUS

## 2023-12-23 MED ORDER — GEMCITABINE CHEMO FOR BLADDER INSTILLATION 2000 MG
2000.0000 mg | Freq: Once | INTRAVENOUS | Status: DC
Start: 2023-12-23 — End: 2023-12-23
  Filled 2023-12-23: qty 52.6

## 2023-12-23 MED ORDER — DEXAMETHASONE SODIUM PHOSPHATE 10 MG/ML IJ SOLN
INTRAMUSCULAR | Status: DC | PRN
  Administered 2023-12-23: 10 mg via INTRAVENOUS

## 2023-12-23 MED ORDER — CEFAZOLIN SODIUM-DEXTROSE 2-4 GM/100ML-% IV SOLN
INTRAVENOUS | Status: AC
  Filled 2023-12-23: qty 100

## 2023-12-23 MED ORDER — CHLORHEXIDINE GLUCONATE 0.12 % MT SOLN
OROMUCOSAL | Status: AC
  Filled 2023-12-23: qty 15

## 2023-12-23 MED ORDER — ONDANSETRON HCL 4 MG/2ML IJ SOLN
INTRAMUSCULAR | Status: DC | PRN
  Administered 2023-12-23: 4 mg via INTRAVENOUS

## 2023-12-23 MED ORDER — SODIUM CHLORIDE 0.9 % IR SOLN
Status: DC | PRN
  Administered 2023-12-23 (×3): 3000 mL

## 2023-12-23 MED ORDER — PROPOFOL 10 MG/ML IV BOLUS
INTRAVENOUS | Status: DC | PRN
  Administered 2023-12-23: 70 mg via INTRAVENOUS
  Administered 2023-12-23: 150 ug/kg/min via INTRAVENOUS

## 2023-12-23 MED ORDER — FENTANYL CITRATE (PF) 100 MCG/2ML IJ SOLN
INTRAMUSCULAR | Status: AC
  Filled 2023-12-23: qty 2

## 2023-12-23 MED ORDER — FENTANYL CITRATE (PF) 100 MCG/2ML IJ SOLN
INTRAMUSCULAR | Status: DC | PRN
  Administered 2023-12-23: 25 ug via INTRAVENOUS
  Administered 2023-12-23: 50 ug via INTRAVENOUS

## 2023-12-23 MED ORDER — LIDOCAINE HCL (CARDIAC) PF 100 MG/5ML IV SOSY
PREFILLED_SYRINGE | INTRAVENOUS | Status: DC | PRN
  Administered 2023-12-23: 50 mg via INTRAVENOUS

## 2023-12-23 MED ORDER — LACTATED RINGERS IV SOLN
INTRAVENOUS | Status: DC
Start: 2023-12-23 — End: 2023-12-23

## 2023-12-23 MED ORDER — PHENYLEPHRINE 80 MCG/ML (10ML) SYRINGE FOR IV PUSH (FOR BLOOD PRESSURE SUPPORT)
PREFILLED_SYRINGE | INTRAVENOUS | Status: DC | PRN
  Administered 2023-12-23 (×2): 80 ug via INTRAVENOUS

## 2023-12-23 SURGICAL SUPPLY — 26 items
BAG DRAIN SIEMENS DORNER NS (MISCELLANEOUS) ×2 IMPLANT
BAG URINE DRAIN 2000ML AR STRL (UROLOGICAL SUPPLIES) ×2 IMPLANT
BRUSH SCRUB EZ 4% CHG (MISCELLANEOUS) ×2 IMPLANT
CATH FOL 2WAY LX 16X30 (CATHETERS) ×1 IMPLANT
CATH URTH 16FR FL 2W BLN LF (CATHETERS) ×1 IMPLANT
DRAPE UTILITY 15X26 TOWEL STRL (DRAPES) ×2 IMPLANT
DRSG TELFA 3X4 N-ADH STERILE (GAUZE/BANDAGES/DRESSINGS) ×2 IMPLANT
ELECT LOOP 22F BIPOLAR SML (ELECTROSURGICAL) ×2 IMPLANT
ELECT REM PT RETURN 9FT ADLT (ELECTROSURGICAL) IMPLANT
ELECTRODE LOOP 22F BIPOLAR SML (ELECTROSURGICAL) ×1 IMPLANT
ELECTRODE REM PT RTRN 9FT ADLT (ELECTROSURGICAL) IMPLANT
GLOVE BIO SURGEON STRL SZ 6.5 (GLOVE) ×2 IMPLANT
GOWN STRL REUS W/ TWL LRG LVL3 (GOWN DISPOSABLE) ×4 IMPLANT
KIT TURNOVER CYSTO (KITS) ×2 IMPLANT
LOOP CUT BIPOLAR 24F LRG (ELECTROSURGICAL) IMPLANT
NDL SAFETY ECLIPSE 18X1.5 (NEEDLE) ×2 IMPLANT
PACK CYSTO AR (MISCELLANEOUS) ×2 IMPLANT
PAD ARMBOARD 7.5X6 YLW CONV (MISCELLANEOUS) ×1 IMPLANT
SET IRRIG Y TYPE TUR BLADDER L (SET/KITS/TRAYS/PACK) ×1 IMPLANT
SOL .9 NS 3000ML IRR UROMATIC (IV SOLUTION) ×1 IMPLANT
SURGILUBE 2OZ TUBE FLIPTOP (MISCELLANEOUS) ×2 IMPLANT
SYR TOOMEY IRRIG 70ML (MISCELLANEOUS) IMPLANT
SYRINGE TOOMEY IRRIG 70ML (MISCELLANEOUS) ×1 IMPLANT
WATER STERILE IRR 1000ML POUR (IV SOLUTION) ×2 IMPLANT
WATER STERILE IRR 3000ML UROMA (IV SOLUTION) IMPLANT
WATER STERILE IRR 500ML POUR (IV SOLUTION) ×1 IMPLANT

## 2023-12-23 NOTE — Interval H&P Note (Signed)
 History and Physical Interval Note:  12/23/2023 11:11 AM  Jessica Morrison  has presented today for surgery, with the diagnosis of Bladder Tumor.  The various methods of treatment have been discussed with the patient and family. After consideration of risks, benefits and other options for treatment, the patient has consented to  Procedure(s): TURBT (TRANSURETHRAL RESECTION OF BLADDER TUMOR) (N/A) BLADDER INSTILLATION OF GEMCITABINE (N/A) as a surgical intervention.  The patient's history has been reviewed, patient examined, no change in status, stable for surgery.  I have reviewed the patient's chart and labs.  Questions were answered to the patient's satisfaction.    RRR CTAB  Vanna Scotland

## 2023-12-23 NOTE — Anesthesia Postprocedure Evaluation (Signed)
 Anesthesia Post Note  Patient: Jessica Morrison  Procedure(s) Performed: TURBT (TRANSURETHRAL RESECTION OF BLADDER TUMOR) (Bladder)  Patient location during evaluation: PACU Anesthesia Type: General Level of consciousness: awake and alert Pain management: pain level controlled Vital Signs Assessment: post-procedure vital signs reviewed and stable Respiratory status: spontaneous breathing, nonlabored ventilation, respiratory function stable and patient connected to nasal cannula oxygen Cardiovascular status: blood pressure returned to baseline and stable Postop Assessment: no apparent nausea or vomiting Anesthetic complications: no   There were no known notable events for this encounter.   Last Vitals:  Vitals:   12/23/23 1101 12/23/23 1215  BP: (!) 152/79 126/65  Pulse: 81 73  Resp: 16 13  Temp: (!) 36.3 C 36.4 C  SpO2: 100% 100%    Last Pain:  Vitals:   12/23/23 1215  TempSrc:   PainSc: 0-No pain                 Corinda Gubler

## 2023-12-23 NOTE — Transfer of Care (Signed)
 Immediate Anesthesia Transfer of Care Note  Patient: Jessica Morrison  Procedure(s) Performed: TURBT (TRANSURETHRAL RESECTION OF BLADDER TUMOR) (Bladder)  Patient Location: PACU  Anesthesia Type:General  Level of Consciousness: drowsy and patient cooperative  Airway & Oxygen Therapy: Patient Spontanous Breathing and Patient connected to face mask oxygen  Post-op Assessment: Report given to RN and Post -op Vital signs reviewed and stable  Post vital signs: stable  Last Vitals:  Vitals Value Taken Time  BP 114/62 12/23/23 1211  Temp    Pulse 73 12/23/23 1212  Resp 14 12/23/23 1212  SpO2 100 % 12/23/23 1212  Vitals shown include unfiled device data.  Last Pain:  Vitals:   12/23/23 1101  TempSrc: Temporal  PainSc: 0-No pain         Complications: No notable events documented.

## 2023-12-23 NOTE — Op Note (Signed)
 Date of procedure: 12/23/23  Preoperative diagnosis:  Recurrent bladder cancer History of left upper tract urothelial carcinoma  Postoperative diagnosis:  Same as above  Procedure: TURBT, medium, multifocal  Surgeon: Vanna Scotland, MD  Anesthesia: General  Complications: None  Intraoperative findings: Posterior bladder wall tumor approximately 1 cm and posterior wall, sent separately.  Conglomerate of at least 5 or 6 tumors at the dome of the bladder measuring 2.5 cm in largest diameter.  There are also a few adjacent satellite lesions as well.  These were all completely resected.  Anterior bladder tumor at the bladder neck also noted and treated.  Left UO surgically absent.  Right UO normal.  Relatively thin bladder and with resection at the dome, elected to leave a catheter as a precaution and abstain from gemcitabine instillation.  EBL: Minimal  Specimens: Posterior bladder wall tumor, dome and bladder neck tumor  Drains: 16 French Foley catheter  Indication: Jessica Morrison is a 78 y.o. patient with personal history of pT3 high-grade left urothelial carcinoma status post nephro ureterectomy with fairly swift bladder recurrence status post BCG now found to have another multifocal recurrence.  She was pretreated with fosfomycin for a positive urinalysis..  After reviewing the management options for treatment, she and her daughter elected to proceed with the above surgical procedure(s). We have discussed the potential benefits and risks of the procedure, side effects of the proposed treatment, the likelihood of the patient achieving the goals of the procedure, and any potential problems that might occur during the procedure or recuperation. Informed consent has been obtained.  Description of procedure:  The patient was taken to the operating room and general anesthesia was induced.  The patient was placed in the dorsal lithotomy position, prepped and draped in the usual sterile  fashion, and preoperative antibiotics were administered. A preoperative time-out was performed.   A 26 French resectoscope was advanced per urethra into the bladder.  The bladder was inspected and noted to have multiple tumors as outlined above in the impure operative findings.  Using saline as the medium, used bipolar loop to resect the posterior bladder wall and portions of the dome.  Because of difficulty reaching the larger tumors at the dome, I did use cold cup biopsies in a few areas to resect the tumor in a piece wise fashion.  Several satellite lesions were also treated/removed.  Next, attention was turned to the anterior bladder neck which was resected using the same loop and then fulguration was performed.  I then fulgurated the base of each of the lesions.  I did send the dome and bladder neck tumor together in the posterior bladder wall separately.  Once adequate hemostasis was confirmed, I did have concerns about the thinness of her bladder anteriorly.  The bladder filled and drained normally and there was no clinical evidence of perforation on exam.  However given her frailty, I did elect to just leave a Foley catheter as a precaution.  Along the same lines, because of the deeper resection at the dome and the thinness of her bladder, elected not to place intravesical gemcitabine due to the concern for possible extravasation.  At this point time, she was repositioned in supine position, reversed of anesthesia, and taken to the PACU in stable condition.  Plan: She will remove her own catheter (her daughter) on Thursday.  Will call her with the results.  I did have a lengthy conversation today postoperatively with her daughter.  She has had several high-grade tumors  and fairly quick recurrences and spite of aggressive treatment including neoadjuvant chemotherapy and more recently intravesical BCG.  Her daughter reports that her mother seems to be declining from a mental status perspective as her  dementia seems to be progressing more rapidly.  I did have a goals of care discussion with her today.  Vanna Scotland, M.D.

## 2023-12-23 NOTE — Anesthesia Procedure Notes (Signed)
 Procedure Name: LMA Insertion Date/Time: 12/23/2023 11:29 AM  Performed by: Maryla Morrow., CRNAPre-anesthesia Checklist: Patient identified, Patient being monitored, Timeout performed, Emergency Drugs available and Suction available Patient Re-evaluated:Patient Re-evaluated prior to induction Oxygen Delivery Method: Circle system utilized Preoxygenation: Pre-oxygenation with 100% oxygen Induction Type: IV induction LMA: LMA inserted LMA Size: 3.0 Number of attempts: 1 Placement Confirmation: positive ETCO2 and breath sounds checked- equal and bilateral Tube secured with: Tape Dental Injury: Teeth and Oropharynx as per pre-operative assessment

## 2023-12-23 NOTE — Progress Notes (Signed)
 Foley to gravity drainage. Daughter to remove on Thursday, will instruct daughter in phase 2 recovery.

## 2023-12-23 NOTE — Progress Notes (Signed)
 1211- called into OR room 10 to notify that gemcitabine was not clamped and foley is to gravity drainage upon arrival to pacu from OR.  1257- Dr. Apolinar Junes called back to pacu to notify to backflow foley contents and clamp for gemcitabine for 30 minutes, then discontinue. Notified patient the plan of care. Resting comfortably in bed at this time

## 2023-12-23 NOTE — Discharge Instructions (Signed)
 Foley catheter may be removed on Thursday morning.  Use a 10 cc syringe to hook up to the balloon port.  Aspirate back at least 10 cc of fluid.  At this point, the Foley catheter may be removed easily.

## 2023-12-23 NOTE — Anesthesia Preprocedure Evaluation (Addendum)
 Anesthesia Evaluation  Patient identified by MRN, date of birth, ID band Patient awake  General Assessment Comment:  AO x 3 but slow to respond.  Reviewed: Allergy & Precautions, NPO status , Patient's Chart, lab work & pertinent test results  History of Anesthesia Complications (+) PONV and history of anesthetic complications  Airway Mallampati: II  TM Distance: >3 FB Neck ROM: full    Dental  (+) Chipped, Dental Advidsory Given   Pulmonary neg pulmonary ROS, neg sleep apnea, neg COPD, Patient abstained from smoking.Not current smoker   Pulmonary exam normal breath sounds clear to auscultation       Cardiovascular Exercise Tolerance: Good hypertension, Pt. on medications +CHF  (-) CAD and (-) Past MI Normal cardiovascular exam(-) dysrhythmias  Rhythm:Regular Rate:Normal - Systolic murmurs 16-10-96 1. Left ventricular ejection fraction, by estimation, is 60 to 65%. The  left ventricle has normal function. The left ventricle has no regional  wall motion abnormalities. Left ventricular diastolic parameters are  consistent with Grade I diastolic  dysfunction (impaired relaxation).   2. Right ventricular systolic function is normal. The right ventricular  size is normal.   3. The posterior leaflet of the mitral valve is mildly restircted with  mild posterior MR. The mitral valve is normal in structure. Mild mitral  valve regurgitation. No evidence of mitral stenosis.   4. The aortic valve is tricuspid. There is mild calcification of the  aortic valve. Aortic valve regurgitation is trivial. Aortic valve  sclerosis/calcification is present, without any evidence of aortic  stenosis.   5. The inferior vena cava is normal in size with greater than 50%  respiratory variability, suggesting right atrial pressure of 3 mmHg.       Neuro/Psych  PSYCHIATRIC DISORDERS     Dementia Neuro/Psych       Dementia 04-06-23 MRI  IMPRESSION:   1. Redemonstrated are multiple predominantly cortical  microhemorrhages bilateral cerebral hemispheres with a new  microhemorrhage in the right frontal lobe and possibly the right  occipital lobe. These findings are nonspecific but can be seen in  the setting of amyloid angiopathy.  2. Small bilateral extra-axial, likely subdural, fluid collections  measuring to 2 mm, which are new from prior exam. These are  nonspecific and may represent subdural hygromas or chronic subdural  hematomas. Correlate with history of trauma. An additional  differential consideration is CSF hypotension, but there are no  other findings to confirm intracranial hypotension     negative neurological ROS     GI/Hepatic Neg liver ROS,GERD  Medicated and Controlled,,  Endo/Other  negative endocrine ROSneg diabetes    Renal/GU CRFRenal diseaseRenal cancer     Musculoskeletal   Abdominal   Peds  Hematology negative hematology ROS (+)   Anesthesia Other Findings Endometriosis  Nephrolithiasis Hyperlipemia Fibrocystic disease of breast Raynaud disease  Thyromegaly Osteopenia  Hypertension Curvature of spine  Back pain Fibroid Kidney stone Osteoporosis  GERD (gastroesophageal reflux disease) Pelvic pain in female  HLD (hyperlipidemia) Cancer (HCC) Melanoma in situ (HCC) Basal cell carcinoma  Dysplastic nevus History of kidney stones Pre-diabetes  Renal cancer, left (HCC) Osteoarthritis B12 deficiency Memory loss PONV (postoperative nausea and vomiting) Mild mitral regurgitation by prior echocardiogram  Grade I diastolic dysfunction Urothelial carcinoma of kidney, left  Transitional cell carcinoma of kidney, left  Reproductive/Obstetrics negative OB ROS  Anesthesia Physical Anesthesia Plan  ASA: 3  Anesthesia Plan: General   Post-op Pain Management:    Induction: Intravenous  PONV Risk Score and Plan: 4 or greater and  Ondansetron, Dexamethasone and Treatment may vary due to age or medical condition  Airway Management Planned: LMA  Additional Equipment: None  Intra-op Plan:   Post-operative Plan: Extubation in OR  Informed Consent: I have reviewed the patients History and Physical, chart, labs and discussed the procedure including the risks, benefits and alternatives for the proposed anesthesia with the patient or authorized representative who has indicated his/her understanding and acceptance.     Dental Advisory Given  Plan Discussed with: Anesthesiologist, CRNA and Surgeon  Anesthesia Plan Comments: (Patient (as well as daughter at bedside) consented for risks of anesthesia including but not limited to:  - adverse reactions to medications - damage to eyes, teeth, lips or other oral mucosa - nerve damage due to positioning  - sore throat or hoarseness - post operative cognitive dysfunction  - Damage to heart, brain, nerves, lungs, other parts of body or loss of life  Patient voiced understanding.)        Anesthesia Quick Evaluation

## 2023-12-24 ENCOUNTER — Encounter: Payer: Self-pay | Admitting: Urology

## 2023-12-24 LAB — SURGICAL PATHOLOGY

## 2023-12-31 ENCOUNTER — Ambulatory Visit: Payer: PPO | Admitting: Urology

## 2023-12-31 VITALS — BP 152/79 | HR 99 | Ht <= 58 in | Wt 101.0 lb

## 2023-12-31 DIAGNOSIS — C689 Malignant neoplasm of urinary organ, unspecified: Secondary | ICD-10-CM | POA: Diagnosis not present

## 2023-12-31 NOTE — Progress Notes (Signed)
 I,Amy L Pierron,acting as a scribe for Vanna Scotland, MD.,have documented all relevant documentation on the behalf of Vanna Scotland, MD,as directed by  Vanna Scotland, MD while in the presence of Vanna Scotland, MD.  12/31/2023 11:53 AM   [name] 08/02/1946 [id number]  Referring provider: Marguarite Arbour, MD [address] [facility] [place],  [place] [address]  Chief Complaint  Patient presents with   Results    HPI: 78 year-old female presents today for follow-up.  She has an extensive GU history including large volume, left upper tract, urothelial carcinoma; status post nephroureterectomy in June 2024. She did receive dose-reduced neoadjuvant chemotherapy, which includes Gemcitabine-cisplatin. 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 The tumor was in greatest diameter 5.1 cm and was invasive. All margins were negative. [id number].   Since then she's had two bladder cancer recurrences, initially one very soon after her nephrectomy in June 2024.  She underwent TURBT with right retrograde pyelogram and instillation of intravesical gemcitabine on 04/03/2023. Surgical pathology was consistent with high grade urothelial with focal area suspicious for focal invasion into the lamina propria. There was muscularis propria present and not involved.  She completed induction BCG x 6 in December.   She had another recurrence and was taken back to the operating room by me on December 23, 2023  for resection of multifocal bladder tumors, including a conglomerate of tumors on the posterior wall, as well as multiple additional satellite lesions. These were consistent again with high grade TCC, one of which was superficial, and the other involving the lamina propria. Superficially muscle was present and not involved.  Her last upper tract imaging was in the form of CT abdomen, pelvis  without contrast and showed no evidence of metastatic disease.   She reports new symptoms of increased nocturnal urination and diarrhea, which began approximately three weeks ago. She is considering starting a probiotic.   She is contemplating treatment options and expresses a desire to live despite previous discussions about not pursuing further treatment.   She is tired most of the time and rests on the couch. She still has interest in doing things she enjoys.   PMH: Past Medical History:  Diagnosis Date   B12 deficiency    Back pain    Basal cell carcinoma 08/04/2020   0.5cm above the left mid brow, EDC 09/15/20   Cancer (HCC)    Melanoma   Curvature of spine    Dysplastic nevus 04/30/2013   left post shoulder, left parasternal   Dysplastic nevus 05/04/2014   right prox ant deltoid, left lat buttock   Dysplastic nevus 03/26/2019   right lat neck adjacent to the inf top of MM in situ site scar   Dysplastic nevus 10/29/2019   upper back left paraspinal at level of mid scapula   Dysplastic nevus 03/31/2020, 05/12/20 shave removal   Left mid pretibial. Moderate to severe atypia, deep margin involved.   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   right  lat neck inf to right angle of mandible, excised: 07/29/2018, margins free   Memory loss    Mild mitral regurgitation by prior echocardiogram    Nephrolithiasis    Osteoarthritis    Osteopenia    Osteoporosis    Pelvic pain in female    PONV (postoperative nausea and vomiting)    Pre-diabetes    Raynaud disease    Renal cancer, left (HCC) 03/2023   Thyromegaly    Transitional cell carcinoma of  kidney, left (HCC)    Urothelial carcinoma of kidney, left (HCC)     Surgical History: Past Surgical History:  Procedure Laterality Date   ABDOMINAL HYSTERECTOMY     BILATERAL OOPHORECTOMY  1999   BLADDER INSTILLATION N/A 04/01/2023   Procedure: BLADDER INSTILLATION OF GEMCITABINE;  Surgeon: Vanna Scotland, MD;  Location: [facility];  Service: Urology;  Laterality: N/A;   BLADDER INSTILLATION N/A 07/01/2023   Procedure: BLADDER INSTILLATION OF GEMCITABINE;  Surgeon: Vanna Scotland, MD;  Location: [facility];  Service: Urology;  Laterality: N/A;   CATARACT EXTRACTION W/PHACO Right 05/09/2023   Procedure: CATARACT EXTRACTION PHACO AND INTRAOCULAR LENS PLACEMENT (IOC) RIGHT MALYUGIN OMIDRIA 7.79 00:59.0;  Surgeon: Estanislado Pandy, MD;  Location: [facility];  Service: Ophthalmology;  Laterality: Right;   CATARACT EXTRACTION W/PHACO Left 05/23/2023   Procedure: CATARACT EXTRACTION PHACO AND INTRAOCULAR LENS PLACEMENT (IOC) LEFT MALYUGIN OMIDRIA 12.13 1:23.6;  Surgeon: Estanislado Pandy, MD;  Location: [facility];  Service: Ophthalmology;  Laterality: Left;   CESAREAN SECTION     COLONOSCOPY  09/02/2020   COLONOSCOPY WITH PROPOFOL N/A 05/09/2015   Procedure: COLONOSCOPY WITH PROPOFOL;  Surgeon: Elnita Maxwell, MD;  Location: [facility];  Service: Endoscopy;  Laterality: N/A;   CYSTOSCOPY W/ RETROGRADES Right 07/01/2023   Procedure: CYSTOSCOPY WITH RETROGRADE PYELOGRAM;  Surgeon: Vanna Scotland, MD;  Location: [facility];  Service: Urology;  Laterality: Right;   CYSTOSCOPY WITH RETROGRADE PYELOGRAM, URETEROSCOPY AND STENT PLACEMENT Left 10/09/2022   Procedure: CYSTOSCOPY WITH RETROGRADE PYELOGRAM, URETEROSCOPY AND STENT PLACEMENT;  Surgeon: Riki Altes, MD;  Location: [facility];  Service: Urology;  Laterality: Left;   IR IMAGING GUIDED PORT INSERTION  12/06/2022   MELANOMA EXCISION Right 07/29/2018   lateral neck inf. to right angle of mandible   OOPHORECTOMY      ROBOT ASSITED LAPAROSCOPIC NEPHROURETERECTOMY Left 04/01/2023   Procedure: XI ROBOT ASSITED LAPAROSCOPIC NEPHROURETERECTOMY;  Surgeon: Vanna Scotland, MD;  Location: [facility];  Service: Urology;  Laterality: Left;   TRANSURETHRAL RESECTION OF BLADDER TUMOR N/A 07/01/2023   Procedure: TRANSURETHRAL RESECTION OF BLADDER TUMOR (TURBT);  Surgeon: Vanna Scotland, MD;  Location: [facility];  Service: Urology;  Laterality: N/A;   TRANSURETHRAL RESECTION OF BLADDER TUMOR N/A 12/23/2023   Procedure: TURBT (TRANSURETHRAL RESECTION OF BLADDER TUMOR);  Surgeon: Vanna Scotland, MD;  Location: [facility];  Service: Urology;  Laterality: N/A;   URETERAL BIOPSY Left 10/09/2022   Procedure: URETERAL BIOPSY;  Surgeon: Riki Altes, MD;  Location: [facility];  Service: Urology;  Laterality: Left;    Home Medications:  Allergies as of 12/31/2023       Reactions   Codeine Nausea And Vomiting   Neosporin Wound Cleanser [benzalkonium Chloride] Other (See Comments)   Skin blisters   Penicillins Hives        Medication List        Accurate as of December 31, 2023 11:53 AM. If you have any questions, ask your nurse or doctor.  acetaminophen 325 MG tablet Commonly known as: TYLENOL Take 650 mg by mouth every 6 (six) hours as needed for moderate pain (pain score 4-6).   calcium-vitamin D 500-200 MG-UNIT tablet Commonly known as: OSCAL WITH D 1 tablet daily with breakfast.   Cholecalciferol 25 MCG (1000 UT) tablet Take 1,000 Units by mouth daily.   cyanocobalamin 1000 MCG tablet Take 1,000 mcg by mouth daily.   desvenlafaxine 25 MG 24 hr tablet Commonly known as: PRISTIQ Take 25 mg by mouth daily.   donepezil 10 MG tablet Commonly known as: ARICEPT Take 10 mg by mouth every evening.   lidocaine-prilocaine cream Commonly known as: EMLA Apply to affected area once What changed:  how much to take how to take this when to take this reasons to take this   memantine 5 MG  tablet Commonly known as: NAMENDA Take 5 mg by mouth in the morning and at bedtime.   omeprazole 40 MG capsule Commonly known as: PRILOSEC 40 mg daily.   ondansetron 8 MG tablet Commonly known as: Zofran Take 1 tablet (8 mg total) by mouth every 8 (eight) hours as needed for nausea or vomiting. Start on the third day after cisplatin.   oxybutynin 5 MG tablet Commonly known as: DITROPAN Take 1 tablet (5 mg total) by mouth every 8 (eight) hours as needed for bladder spasms.   prochlorperazine 10 MG tablet Commonly known as: COMPAZINE Take 1 tablet (10 mg total) by mouth every 6 (six) hours as needed (Nausea or vomiting).   raloxifene 60 MG tablet Commonly known as: EVISTA TAKE ONE TABLET BY MOUTH ONCE A DAY   rosuvastatin 5 MG tablet Commonly known as: CRESTOR Take 5 mg by mouth daily.        Allergies:  Allergies  Allergen Reactions   Codeine Nausea And Vomiting   Neosporin Wound Cleanser [Benzalkonium Chloride] Other (See Comments)    Skin blisters   Penicillins Hives    Family History: Family History  Problem Relation Age of Onset   Emphysema Father    Cancer Neg Hx    Heart disease Neg Hx    Diabetes Neg Hx    Breast cancer Neg Hx     Social History:  reports that she has never smoked. She has never been exposed to tobacco smoke. She has never used smokeless tobacco. She reports that she does not drink alcohol and does not use drugs.   Physical Exam: BP (!) 152/79   Pulse 99   Ht 4\' 9"  (1.448 m)   Wt 101 lb (45.8 kg)   BMI 21.86 kg/m   Constitutional:  Alert and oriented, No acute distress. HEENT: [place] AT, moist mucus membranes.  Trachea midline, no masses. Neurologic: Grossly intact, no focal deficits, moving all 4 extremities. Psychiatric: Normal mood and affect.   Assessment & Plan:    1. Urothelial Carcinoma with Bladder Recurrence - The aggressive nature of the tumor is concerning, given the rapid recurrence post-BCG treatment.   - Options  discussed include another round of BCG, immunotherapy with IV infusions every three weeks, or observation with rescoping in three months.  - Recommend consult with Dr. Orlie Dakin to help determine the next steps. Will reach out via MyChart after this discussion. - Referral to palliative care (Josh at the cancer center) is recommended to assist with decision-making and provide support for the patient and family.  No follow-ups on file.  [facility] [address], [address] [place], [place] [address] [phone]

## 2024-01-02 ENCOUNTER — Inpatient Hospital Stay (HOSPITAL_BASED_OUTPATIENT_CLINIC_OR_DEPARTMENT_OTHER): Admitting: Hospice and Palliative Medicine

## 2024-01-02 ENCOUNTER — Other Ambulatory Visit: Payer: Self-pay

## 2024-01-02 ENCOUNTER — Encounter: Payer: Self-pay | Admitting: Oncology

## 2024-01-02 ENCOUNTER — Inpatient Hospital Stay: Attending: Oncology | Admitting: Oncology

## 2024-01-02 ENCOUNTER — Telehealth: Payer: Self-pay | Admitting: *Deleted

## 2024-01-02 VITALS — BP 103/57 | HR 90 | Temp 98.4°F | Resp 16 | Ht <= 58 in | Wt 100.0 lb

## 2024-01-02 DIAGNOSIS — R197 Diarrhea, unspecified: Secondary | ICD-10-CM | POA: Diagnosis not present

## 2024-01-02 DIAGNOSIS — Z5112 Encounter for antineoplastic immunotherapy: Secondary | ICD-10-CM | POA: Diagnosis not present

## 2024-01-02 DIAGNOSIS — C642 Malignant neoplasm of left kidney, except renal pelvis: Secondary | ICD-10-CM

## 2024-01-02 DIAGNOSIS — Z515 Encounter for palliative care: Secondary | ICD-10-CM

## 2024-01-02 DIAGNOSIS — Z79620 Long term (current) use of immunosuppressive biologic: Secondary | ICD-10-CM | POA: Insufficient documentation

## 2024-01-02 MED ORDER — DIPHENOXYLATE-ATROPINE 2.5-0.025 MG PO TABS: 1.0000 | ORAL_TABLET | Freq: Four times a day (QID) | ORAL | 0 refills | Status: DC | PRN

## 2024-01-02 NOTE — Progress Notes (Signed)
 [facility]  [phone] Fax:([phone]  ID: Jessica Morrison OB: [birth year]  MR#: [id number]  FBP#:[id number]  Patient Care Team: Marguarite Arbour, MD as PCP - General (Internal Medicine) Jeralyn Ruths, MD as Consulting Physician (Oncology) Vanna Scotland, MD as Consulting Physician (Urology)  CHIEF COMPLAINT: Recurrent urothelial carcinoma of the kidney.  INTERVAL HISTORY: Patient is referred back to clinic from urology after noting recurrence of disease on recent cystoscopy to discuss treatment options.  She currently feels well and is approximately at her baseline.  She continue to have a poor memory.  She has no neurologic complaints.  She denies any recent fevers or illnesses. She has no chest pain, shortness of breath, cough, or hemoptysis.  She denies any nausea, vomiting, or constipation.  She continues to have significant diarrhea.  She has no urinary complaints.  Patient offers no further specific complaints today.  REVIEW OF SYSTEMS:   Review of Systems  Constitutional: Negative.  Negative for fever, malaise/fatigue and weight loss.  Respiratory: Negative.  Negative for cough and shortness of breath.   Cardiovascular: Negative.  Negative for chest pain and leg swelling.  Gastrointestinal:  Positive for diarrhea. Negative for abdominal pain.  Genitourinary: Negative.  Negative for hematuria.  Musculoskeletal: Negative.  Negative for back pain.  Skin: Negative.  Negative for rash.  Neurological: Negative.  Negative for dizziness, focal weakness, weakness and headaches.  Psychiatric/Behavioral:  Positive for memory loss. The patient is not nervous/anxious.     As per HPI. Otherwise, a complete review of systems is negative.  PAST MEDICAL HISTORY: Past Medical History:  Diagnosis Date   B12 deficiency    Back pain    Basal cell carcinoma 08/04/2020   0.5cm above the left mid brow, EDC 09/15/20   Cancer (HCC)    Melanoma    Curvature of spine    Dysplastic nevus 04/30/2013   left post shoulder, left parasternal   Dysplastic nevus 05/04/2014   right prox ant deltoid, left lat buttock   Dysplastic nevus 03/26/2019   right lat neck adjacent to the inf top of MM in situ site scar   Dysplastic nevus 10/29/2019   upper back left paraspinal at level of mid scapula   Dysplastic nevus 03/31/2020, 05/12/20 shave removal   Left mid pretibial. Moderate to severe atypia, deep margin involved.   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   right lat neck inf to right angle of mandible, excised: 07/29/2018, margins free   Memory loss    Mild mitral regurgitation by prior echocardiogram    Nephrolithiasis    Osteoarthritis    Osteopenia    Osteoporosis    Pelvic pain in female    PONV (postoperative nausea and vomiting)    Pre-diabetes    Raynaud disease    Renal cancer, left (HCC) 03/2023   Thyromegaly    Transitional cell carcinoma of kidney, left (HCC)    Urothelial carcinoma of kidney, left (HCC)     PAST SURGICAL HISTORY: Past Surgical History:  Procedure Laterality Date   ABDOMINAL HYSTERECTOMY  BILATERAL OOPHORECTOMY  1999   BLADDER INSTILLATION N/A 04/01/2023   Procedure: BLADDER INSTILLATION OF GEMCITABINE;  Surgeon: Vanna Scotland, MD;  Location: [facility];  Service: Urology;  Laterality: N/A;   BLADDER INSTILLATION N/A 07/01/2023   Procedure: BLADDER INSTILLATION OF GEMCITABINE;  Surgeon: Vanna Scotland, MD;  Location: [facility];  Service: Urology;  Laterality: N/A;   CATARACT EXTRACTION W/PHACO Right 05/09/2023   Procedure: CATARACT EXTRACTION PHACO AND INTRAOCULAR  LENS PLACEMENT (IOC) RIGHT MALYUGIN OMIDRIA 7.79 00:59.0;  Surgeon: Estanislado Pandy, MD;  Location: [facility];  Service: Ophthalmology;  Laterality: Right;   CATARACT EXTRACTION W/PHACO Left 05/23/2023   Procedure: CATARACT EXTRACTION PHACO AND INTRAOCULAR LENS PLACEMENT (IOC) LEFT MALYUGIN OMIDRIA 12.13 1:23.6;  Surgeon: Estanislado Pandy, MD;  Location: [facility];  Service: Ophthalmology;  Laterality: Left;   CESAREAN SECTION     COLONOSCOPY  09/02/2020   COLONOSCOPY WITH PROPOFOL N/A 05/09/2015   Procedure: COLONOSCOPY WITH PROPOFOL;  Surgeon: Elnita Maxwell, MD;  Location: [facility];  Service: Endoscopy;  Laterality: N/A;   CYSTOSCOPY W/ RETROGRADES Right 07/01/2023   Procedure: CYSTOSCOPY WITH RETROGRADE PYELOGRAM;  Surgeon: Vanna Scotland, MD;  Location: [facility];  Service: Urology;  Laterality: Right;   CYSTOSCOPY WITH RETROGRADE PYELOGRAM, URETEROSCOPY AND STENT PLACEMENT Left 10/09/2022   Procedure: CYSTOSCOPY WITH RETROGRADE PYELOGRAM, URETEROSCOPY AND STENT PLACEMENT;  Surgeon: Riki Altes, MD;  Location: [facility];  Service: Urology;  Laterality: Left;   IR IMAGING GUIDED PORT INSERTION  12/06/2022   MELANOMA EXCISION Right 07/29/2018   lateral neck inf. to right angle of mandible   OOPHORECTOMY     ROBOT ASSITED LAPAROSCOPIC NEPHROURETERECTOMY Left 04/01/2023   Procedure: XI ROBOT ASSITED LAPAROSCOPIC NEPHROURETERECTOMY;  Surgeon: Vanna Scotland, MD;  Location: [facility];  Service: Urology;  Laterality: Left;   TRANSURETHRAL RESECTION OF BLADDER TUMOR N/A 07/01/2023   Procedure: TRANSURETHRAL RESECTION OF BLADDER TUMOR (TURBT);  Surgeon: Vanna Scotland, MD;  Location: [facility];  Service: Urology;  Laterality: N/A;   TRANSURETHRAL RESECTION OF BLADDER TUMOR N/A 12/23/2023   Procedure: TURBT (TRANSURETHRAL RESECTION OF BLADDER TUMOR);  Surgeon: Vanna Scotland, MD;  Location: [facility];  Service: Urology;  Laterality: N/A;   URETERAL BIOPSY  Left 10/09/2022   Procedure: URETERAL BIOPSY;  Surgeon: Riki Altes, MD;  Location: [facility];  Service: Urology;  Laterality: Left;    FAMILY HISTORY: Family History  Problem Relation Age of Onset   Emphysema Father    Cancer Neg Hx    Heart disease Neg Hx    Diabetes Neg Hx    Breast cancer Neg Hx     ADVANCED DIRECTIVES (Y/N):  N  HEALTH MAINTENANCE: Social History   Tobacco Use   Smoking status: Never    Passive exposure: Never   Smokeless tobacco: Never  Vaping Use   Vaping status: Never Used  Substance Use Topics   Alcohol use: No   Drug use: No     Colonoscopy:  PAP:  Bone density:  Lipid panel:  Allergies  Allergen Reactions   Codeine Nausea And Vomiting   Neosporin Wound Cleanser [Benzalkonium Chloride] Other (See Comments)    Skin blisters   Penicillins Hives    Current Outpatient Medications  Medication Sig Dispense Refill   acetaminophen (TYLENOL) 325 MG tablet Take 650 mg by mouth every 6 (six) hours as needed for moderate pain (pain score 4-6).     calcium-vitamin D (OSCAL WITH D) 500-200 MG-UNIT tablet 1 tablet daily with breakfast.  Cholecalciferol 25 MCG (1000 UT) tablet Take 1,000 Units by mouth daily.     cyanocobalamin 1000 MCG tablet Take 1,000 mcg by mouth daily.     desvenlafaxine (PRISTIQ) 25 MG 24 hr tablet Take 25 mg by mouth daily.     donepezil (ARICEPT) 10 MG tablet Take 10 mg by mouth every evening.     lidocaine-prilocaine (EMLA) cream Apply to affected area once (Patient taking differently: Apply 1 Application topically daily as needed (prior to port flush). Apply to affected area once) 30 g 3   memantine (NAMENDA) 5 MG tablet Take 5 mg by mouth in the morning and at bedtime.     omeprazole (PRILOSEC) 40 MG capsule 40 mg daily.     ondansetron (ZOFRAN) 8 MG tablet Take 1 tablet (8 mg total) by mouth every 8 (eight) hours as needed for nausea or vomiting. Start on the third day after cisplatin. 60 tablet 2   oxybutynin  (DITROPAN) 5 MG tablet Take 1 tablet (5 mg total) by mouth every 8 (eight) hours as needed for bladder spasms. 30 tablet 0   prochlorperazine (COMPAZINE) 10 MG tablet Take 1 tablet (10 mg total) by mouth every 6 (six) hours as needed (Nausea or vomiting). 60 tablet 2   raloxifene (EVISTA) 60 MG tablet TAKE ONE TABLET BY MOUTH ONCE A DAY 90 tablet 0   rosuvastatin (CRESTOR) 5 MG tablet Take 5 mg by mouth daily.     diphenoxylate-atropine (LOMOTIL) 2.5-0.025 MG tablet Take 1 tablet by mouth 4 (four) times daily as needed for diarrhea or loose stools. 30 tablet 0   No current facility-administered medications for this visit.    OBJECTIVE: Vitals:   01/02/24 1002  BP: (!) 103/57  Pulse: 90  Resp: 16  Temp: 98.4 F (36.9 C)  SpO2: 100%     Body mass index is 21.64 kg/m.    ECOG FS:0 - Asymptomatic  General: Well-developed, well-nourished, no acute distress. Eyes: Pink conjunctiva, anicteric sclera. HEENT: Normocephalic, moist mucous membranes. Lungs: No audible wheezing or coughing. Heart: Regular rate and rhythm. Abdomen: Soft, nontender, no obvious distention. Musculoskeletal: No edema, cyanosis, or clubbing. Neuro: Alert, answering all questions appropriately. Cranial nerves grossly intact. Skin: No rashes or petechiae noted. Psych: Normal affect.  LAB RESULTS:  Lab Results  Component Value Date   NA 137 12/10/2023   K 3.9 12/10/2023   CL 109 12/10/2023   CO2 19 (L) 12/10/2023   GLUCOSE 90 12/10/2023   BUN 27 (H) 12/10/2023   CREATININE 1.91 (H) 12/10/2023   CALCIUM 8.4 (L) 12/10/2023   PROT 6.1 (L) 11/11/2023   ALBUMIN 3.9 11/11/2023   AST 20 11/11/2023   ALT 13 11/11/2023   ALKPHOS 48 11/11/2023   BILITOT 0.6 11/11/2023   GFRNONAA 27 (L) 12/10/2023    Lab Results  Component Value Date   WBC 9.3 12/09/2023   NEUTROABS 4.9 11/11/2023   HGB 12.5 12/09/2023   HCT 38.0 12/09/2023   MCV 86.2 12/09/2023   PLT 167 12/09/2023     STUDIES: NM Pulmonary  Perfusion Result Date: 12/09/2023 CLINICAL DATA:  Pulmonary embolism (PE) suspected, high prob EXAM: NUCLEAR MEDICINE PERFUSION LUNG SCAN TECHNIQUE: Perfusion images were obtained in multiple projections after intravenous injection of radiopharmaceutical. Ventilation scans intentionally deferred if perfusion scan and chest x-ray adequate for interpretation during COVID 19 epidemic. RADIOPHARMACEUTICALS:  4.0 mCi Tc-74m MAA IV COMPARISON:  X-ray 12/09/2023 FINDINGS: Homogeneous distribution of radiotracer throughout both lungs. No perfusion defects. IMPRESSION: No scintigraphic evidence  of pulmonary embolism. Electronically Signed   By: Duanne Guess D.O.   On: 12/09/2023 15:33   US RENAL Result Date: 12/09/2023 CLINICAL DATA:  Acute kidney injury EXAM: RENAL / URINARY TRACT ULTRASOUND COMPLETE COMPARISON:  11/11/2023 FINDINGS: Right Kidney: Renal measurements: 8.9 x 3.4 x 4.5 cm = volume: 70.8 mL. Normal renal cortical echotexture. There is mild right-sided hydronephrosis, which persists after voiding. No renal mass or urinary tract calculi. Left Kidney: Surgically absent. Bladder: The bladder is moderately distended, with a prevoid volume of 120 cc. Postvoid residual measures 2 cc. There is a rounded hypoechoic mass along the left posterolateral wall the bladder, measuring 1.1 x 0.7 x 1.2 cm. Other: None. IMPRESSION: 1. Prior left nephrectomy. 2. Mild right hydronephrosis, which persists after voiding. No urinary tract calculi are identified. 3. 1.2 cm hypodense mass within the left posterolateral aspect of the bladder. Further evaluation with cystoscopy may be useful. Electronically Signed   By: Sharlet Salina M.D.   On: 12/09/2023 15:21   DG Chest 2 View Result Date: 12/09/2023 CLINICAL DATA:  Shortness of breath. EXAM: CHEST - 2 VIEW COMPARISON:  None Available. FINDINGS: Right-sided Port-A-Cath with tip at the cavoatrial junction. No focal consolidation, pleural effusion, or pneumothorax. The cardiac  silhouette is within limits. No acute osseous pathology. Degenerative changes of the spine and scoliosis. IMPRESSION: No active cardiopulmonary disease. Electronically Signed   By: Elgie Collard M.D.   On: 12/09/2023 14:50    ASSESSMENT: Recurrent urothelial carcinoma of the kidney.  PLAN:    Recurrent urothelial carcinoma of the kidney:  Patient completed 4 cycles of neoadjuvant cisplatin and gemcitabine on Feb 19, 2023.  She underwent complete R0 surgical resection on April 01, 2023.  Patient also recently underwent BCG treatments for noninvasive lesions in her bladder.  Repeat cystoscopy on December 23, 2023 revealed a recurrence of a high-grade invasive urothelial carcinoma.  After lengthy discussion with the patient and her daughter, is agreed upon that surgery and chemotherapy were no longer options for treatment.  She has agreed to single agent Keytruda every 3 weeks.  Hospice and comfort care were also discussed, but patient would like to try treatment first.  She expressed understanding that there is likely no further treatment options after the immunotherapy.  Return to clinic in 1 week to initiate cycle 1 of Keytruda.  Appreciate palliative care input.  Memory loss: Chronic and unchanged.  Continue follow-up with neurology as indicated.   Renal insufficiency: Chronic and unchanged.  Patient's most recent GFR was 27.  Rande Lawman does not need to be dose reduced due to renal insufficiency. Hyperkalemia: Resolved. Anemia: Resolved. Thrombocytopenia: Resolved. Diarrhea: Previously, patient was on extended antibiotics, therefore will send for C. difficile as well as other infectious etiologies.  She is given a prescription for Lomotil, but instructed not to initiate treatment until his stool sample was taken.  Patient expressed understanding and was in agreement with this plan. She also understands that She can call clinic at any time with any questions, concerns, or complaints.    Cancer Staging   Urothelial carcinoma of kidney [facility]) Staging form: Kidney, AJCC 8th Edition - Clinical stage from 12/01/2022: Stage Unknown (cTX, cN0, cM0) - Signed by Jeralyn Ruths, MD on 12/01/2022 Stage prefix: Initial diagnosis Histologic grade (G): G3 Histologic grading system: 4 grade system   Jeralyn Ruths, MD   01/02/2024 1:31 PM

## 2024-01-02 NOTE — Progress Notes (Signed)
 Having lower abdominal pain since the end of January. Can not control bowels or bladder. Having diarrhea and daughter states that she had given her imodium and pepto and it does not help at all. Daughter states she has been on antibiotics more in the last couple months than she is used to. Had tumors removed last week from bladder.

## 2024-01-02 NOTE — Progress Notes (Signed)
 Pharmacist Chemotherapy Monitoring - Initial Assessment    Anticipated start date: 01/09/24   The following has been reviewed per standard work regarding the patient's treatment regimen: The patient's diagnosis, treatment plan and drug doses, and organ/hematologic function Lab orders and baseline tests specific to treatment regimen  The treatment plan start date, drug sequencing, and pre-medications Prior authorization status  Patient's documented medication list, including drug-drug interaction screen and prescriptions for anti-emetics and supportive care specific to the treatment regimen The drug concentrations, fluid compatibility, administration routes, and timing of the medications to be used The patient's access for treatment and lifetime cumulative dose history, if applicable  The patient's medication allergies and previous infusion related reactions, if applicable   Changes made to treatment plan:  N/A  Follow up needed:  N/A   Jessica Morrison, RPH, 01/02/2024  2:35 PM

## 2024-01-02 NOTE — Progress Notes (Signed)
 DISCONTINUE ON PATHWAY REGIMEN - Bladder     A cycle is every 21 days:     Gemcitabine      Cisplatin   **Always confirm dose/schedule in your pharmacy ordering system**  PRIOR TREATMENT: [id number]: Gemcitabine 1,000 mg/m2 D1, 8 + Cisplatin 70 mg/m2 D1 q21 Days x 4 Cycles  START ON PATHWAY REGIMEN - Bladder     A cycle is every 21 days:     Pembrolizumab   **Always confirm dose/schedule in your pharmacy ordering system**  Patient Characteristics: Advanced/Metastatic Disease, Second Line, Prior/Ineligible for Platinum-Based Therapy, FGFR3 Alteration Absent or Unknown, Prior Platinum-Based Therapy and No Prior PD-1/PD-L1 Inhibitor Therapeutic Status: Advanced/Metastatic Disease Line of Therapy: Second Line FGFR3 Alteration Status: Did Not Order Test Intent of Therapy: Non-Curative / Palliative Intent, Discussed with Patient

## 2024-01-02 NOTE — Progress Notes (Signed)
 Palliative Medicine [facility] at [facility] [phone] [phone]   Name: Jessica Morrison Date: 01/02/2024 MRN: [id number]  DOB: [birth year]  Patient Care Team: Marguarite Arbour, MD as PCP - General (Internal Medicine) Jeralyn Ruths, MD as Consulting Physician (Oncology) Vanna Scotland, MD as Consulting Physician (Urology)    REASON FOR CONSULTATION: Jessica Morrison is a 78 y.o. female with multiple medical problems including CKD stage IV, dementia, upper tract urothelial cancer and multiple recurrent bladder cancer status post previous left nephrectomy/chemotherapy.  She is status post previous TURBT and instillation of intravascular gemcitabine and BCG treatments.  She was referred to palliative care to address goals.  SOCIAL HISTORY:     reports that she has never smoked. She has never been exposed to tobacco smoke. She has never used smokeless tobacco. She reports that she does not drink alcohol and does not use drugs.  Patient is widowed.  She lives at home alone but her daughter lives next-door.  Patient previously worked in Designer, fashion/clothing.  ADVANCE DIRECTIVES:  On file  CODE STATUS: DNR/DNI (DNR order signed on 01/02/2024)  PAST MEDICAL HISTORY: Past Medical History:  Diagnosis Date   B12 deficiency    Back pain    Basal cell carcinoma 08/04/2020   0.5cm above the left mid brow, EDC 09/15/20   Cancer (HCC)    Melanoma   Curvature of spine    Dysplastic nevus 04/30/2013   left post shoulder, left parasternal   Dysplastic nevus 05/04/2014   right prox ant deltoid, left lat buttock   Dysplastic nevus 03/26/2019   right lat neck adjacent to the inf top of MM in situ site scar   Dysplastic nevus 10/29/2019   upper back left paraspinal at level of mid scapula   Dysplastic nevus 03/31/2020, 05/12/20 shave removal   Left mid pretibial. Moderate to severe atypia, deep margin involved.   Dysplastic nevus 06/29/2021   L  ant deltoid, moderat to severe atypia, excised 08/15/21   Dyspnea    Endometriosis    Fibrocystic disease of breast    Fibroid    GERD (gastroesophageal reflux disease)    Grade I diastolic dysfunction    History of kidney stones    HLD (hyperlipidemia)    Hyperlipemia    Hypertension    Kidney stone    Melanoma in situ (HCC) 07/03/2018   right lat neck inf to right angle of mandible, excised: 07/29/2018, margins free   Memory loss    Mild mitral regurgitation by prior echocardiogram    Nephrolithiasis    Osteoarthritis    Osteopenia    Osteoporosis    Pelvic pain in female    PONV (postoperative nausea and vomiting)    Pre-diabetes    Raynaud disease    Renal cancer, left (HCC) 03/2023   Thyromegaly    Transitional cell carcinoma of kidney, left (HCC)    Urothelial carcinoma of kidney, left (HCC)     PAST SURGICAL HISTORY:  Past Surgical History:  Procedure Laterality Date   ABDOMINAL HYSTERECTOMY     BILATERAL OOPHORECTOMY  1999   BLADDER INSTILLATION N/A 04/01/2023   Procedure: BLADDER INSTILLATION OF GEMCITABINE;  Surgeon: Vanna Scotland, MD;  Location: [facility];  Service: Urology;  Laterality: N/A;   BLADDER INSTILLATION N/A 07/01/2023   Procedure: BLADDER INSTILLATION OF GEMCITABINE;  Surgeon: Vanna Scotland, MD;  Location: [facility];  Service: Urology;  Laterality: N/A;   CATARACT EXTRACTION W/PHACO Right 05/09/2023  Procedure: CATARACT EXTRACTION PHACO AND INTRAOCULAR LENS PLACEMENT (IOC) RIGHT MALYUGIN OMIDRIA 7.79 00:59.0;  Surgeon: Estanislado Pandy, MD;  Location: [facility];  Service: Ophthalmology;  Laterality: Right;   CATARACT EXTRACTION W/PHACO Left 05/23/2023   Procedure: CATARACT EXTRACTION PHACO AND INTRAOCULAR LENS PLACEMENT (IOC) LEFT MALYUGIN OMIDRIA 12.13 1:23.6;  Surgeon: Estanislado Pandy, MD;  Location: [facility];  Service: Ophthalmology;  Laterality: Left;   CESAREAN SECTION     COLONOSCOPY  09/02/2020   COLONOSCOPY  WITH PROPOFOL N/A 05/09/2015   Procedure: COLONOSCOPY WITH PROPOFOL;  Surgeon: Elnita Maxwell, MD;  Location: [facility]COPY;  Service: Endoscopy;  Laterality: N/A;   CYSTOSCOPY W/ RETROGRADES Right 07/01/2023   Procedure: CYSTOSCOPY WITH RETROGRADE PYELOGRAM;  Surgeon: Vanna Scotland, MD;  Location: [facility];  Service: Urology;  Laterality: Right;   CYSTOSCOPY WITH RETROGRADE PYELOGRAM, URETEROSCOPY AND STENT PLACEMENT Left 10/09/2022   Procedure: CYSTOSCOPY WITH RETROGRADE PYELOGRAM, URETEROSCOPY AND STENT PLACEMENT;  Surgeon: Riki Altes, MD;  Location: [facility];  Service: Urology;  Laterality: Left;   IR IMAGING GUIDED PORT INSERTION  12/06/2022   MELANOMA EXCISION Right 07/29/2018   lateral neck inf. to right angle of mandible   OOPHORECTOMY     ROBOT ASSITED LAPAROSCOPIC NEPHROURETERECTOMY Left 04/01/2023   Procedure: XI ROBOT ASSITED LAPAROSCOPIC NEPHROURETERECTOMY;  Surgeon: Vanna Scotland, MD;  Location: [facility];  Service: Urology;  Laterality: Left;   TRANSURETHRAL RESECTION OF BLADDER TUMOR N/A 07/01/2023   Procedure: TRANSURETHRAL RESECTION OF BLADDER TUMOR (TURBT);  Surgeon: Vanna Scotland, MD;  Location: [facility];  Service: Urology;  Laterality: N/A;   TRANSURETHRAL RESECTION OF BLADDER TUMOR N/A 12/23/2023   Procedure: TURBT (TRANSURETHRAL RESECTION OF BLADDER TUMOR);  Surgeon: Vanna Scotland, MD;  Location: [facility];  Service: Urology;  Laterality: N/A;   URETERAL BIOPSY Left 10/09/2022   Procedure: URETERAL BIOPSY;  Surgeon: Riki Altes, MD;  Location: [facility];  Service: Urology;  Laterality: Left;    HEMATOLOGY/ONCOLOGY HISTORY:  Oncology History  Urothelial carcinoma of kidney (HCC)  11/30/2022 Initial Diagnosis   Urothelial carcinoma of kidney (HCC)   12/01/2022 Cancer Staging   Staging form: Kidney, AJCC 8th Edition - Clinical stage from 12/01/2022: Stage Unknown (cTX, cN0, cM0) - Signed by Jeralyn Ruths, MD on 12/01/2022 Stage prefix: Initial  diagnosis Histologic grade (G): G3 Histologic grading system: 4 grade system   12/11/2022 -  Chemotherapy   Patient is on Treatment Plan : BLADDER Cisplatin D1 + Gemcitabine D1,8 q21d x 4 Cycles       ALLERGIES:  is allergic to codeine, neosporin wound cleanser [benzalkonium chloride], and penicillins.  MEDICATIONS:  Current Outpatient Medications  Medication Sig Dispense Refill   acetaminophen (TYLENOL) 325 MG tablet Take 650 mg by mouth every 6 (six) hours as needed for moderate pain (pain score 4-6).     calcium-vitamin D (OSCAL WITH D) 500-200 MG-UNIT tablet 1 tablet daily with breakfast.     Cholecalciferol 25 MCG (1000 UT) tablet Take 1,000 Units by mouth daily.     cyanocobalamin 1000 MCG tablet Take 1,000 mcg by mouth daily.     desvenlafaxine (PRISTIQ) 25 MG 24 hr tablet Take 25 mg by mouth daily.     donepezil (ARICEPT) 10 MG tablet Take 10 mg by mouth every evening.     lidocaine-prilocaine (EMLA) cream Apply to affected area once (Patient taking differently: Apply 1 Application topically daily as needed (prior to port flush). Apply to affected area once) 30 g 3  memantine (NAMENDA) 5 MG tablet Take 5 mg by mouth in the morning and at bedtime.     omeprazole (PRILOSEC) 40 MG capsule 40 mg daily.     ondansetron (ZOFRAN) 8 MG tablet Take 1 tablet (8 mg total) by mouth every 8 (eight) hours as needed for nausea or vomiting. Start on the third day after cisplatin. 60 tablet 2   oxybutynin (DITROPAN) 5 MG tablet Take 1 tablet (5 mg total) by mouth every 8 (eight) hours as needed for bladder spasms. 30 tablet 0   prochlorperazine (COMPAZINE) 10 MG tablet Take 1 tablet (10 mg total) by mouth every 6 (six) hours as needed (Nausea or vomiting). 60 tablet 2   raloxifene (EVISTA) 60 MG tablet TAKE ONE TABLET BY MOUTH ONCE A DAY 90 tablet 0   rosuvastatin (CRESTOR) 5 MG tablet Take 5 mg by mouth daily.     No current facility-administered medications for this visit.    VITAL  SIGNS: There were no vitals taken for this visit. There were no vitals filed for this visit.  Estimated body mass index is 21.86 kg/m as calculated from the following:   Height as of 12/31/23: 4\' 9"  (1.448 m).   Weight as of 12/31/23: 101 lb (45.8 kg).  LABS: CBC:    Component Value Date/Time   WBC 9.3 12/09/2023 1008   HGB 12.5 12/09/2023 1008   HGB 9.9 (L) 07/31/2023 1234   HGB 12.4 11/27/2022 1200   HCT 38.0 12/09/2023 1008   HCT 37.5 11/27/2022 1200   PLT 167 12/09/2023 1008   PLT 106 (L) 07/31/2023 1234   MCV 86.2 12/09/2023 1008   MCV 84 11/27/2022 1200   NEUTROABS 4.9 11/11/2023 1304   NEUTROABS 4.8 11/27/2022 1200   LYMPHSABS 1.8 11/11/2023 1304   LYMPHSABS 1.4 11/27/2022 1200   MONOABS 0.9 11/11/2023 1304   EOSABS 0.1 11/11/2023 1304   EOSABS 0.2 11/27/2022 1200   BASOSABS 0.1 11/11/2023 1304   BASOSABS 0.1 11/27/2022 1200   Comprehensive Metabolic Panel:    Component Value Date/Time   NA 137 12/10/2023 0443   K 3.9 12/10/2023 0443   CL 109 12/10/2023 0443   CO2 19 (L) 12/10/2023 0443   BUN 27 (H) 12/10/2023 0443   CREATININE 1.91 (H) 12/10/2023 0443   CREATININE 1.87 (H) 11/11/2023 1305   GLUCOSE 90 12/10/2023 0443   CALCIUM 8.4 (L) 12/10/2023 0443   AST 20 11/11/2023 1305   ALT 13 11/11/2023 1305   ALKPHOS 48 11/11/2023 1305   BILITOT 0.6 11/11/2023 1305   PROT 6.1 (L) 11/11/2023 1305   ALBUMIN 3.9 11/11/2023 1305    RADIOGRAPHIC STUDIES: NM Pulmonary Perfusion Result Date: 12/09/2023 CLINICAL DATA:  Pulmonary embolism (PE) suspected, high prob EXAM: NUCLEAR MEDICINE PERFUSION LUNG SCAN TECHNIQUE: Perfusion images were obtained in multiple projections after intravenous injection of radiopharmaceutical. Ventilation scans intentionally deferred if perfusion scan and chest x-ray adequate for interpretation during COVID 19 epidemic. RADIOPHARMACEUTICALS:  4.0 mCi Tc-14m MAA IV COMPARISON:  X-ray 12/09/2023 FINDINGS: Homogeneous distribution of radiotracer  throughout both lungs. No perfusion defects. IMPRESSION: No scintigraphic evidence of pulmonary embolism. Electronically Signed   By: Duanne Guess D.O.   On: 12/09/2023 15:33   US RENAL Result Date: 12/09/2023 CLINICAL DATA:  Acute kidney injury EXAM: RENAL / URINARY TRACT ULTRASOUND COMPLETE COMPARISON:  11/11/2023 FINDINGS: Right Kidney: Renal measurements: 8.9 x 3.4 x 4.5 cm = volume: 70.8 mL. Normal renal cortical echotexture. There is mild right-sided hydronephrosis, which persists after voiding. No  renal mass or urinary tract calculi. Left Kidney: Surgically absent. Bladder: The bladder is moderately distended, with a prevoid volume of 120 cc. Postvoid residual measures 2 cc. There is a rounded hypoechoic mass along the left posterolateral wall the bladder, measuring 1.1 x 0.7 x 1.2 cm. Other: None. IMPRESSION: 1. Prior left nephrectomy. 2. Mild right hydronephrosis, which persists after voiding. No urinary tract calculi are identified. 3. 1.2 cm hypodense mass within the left posterolateral aspect of the bladder. Further evaluation with cystoscopy may be useful. Electronically Signed   By: Sharlet Salina M.D.   On: 12/09/2023 15:21   DG Chest 2 View Result Date: 12/09/2023 CLINICAL DATA:  Shortness of breath. EXAM: CHEST - 2 VIEW COMPARISON:  None Available. FINDINGS: Right-sided Port-A-Cath with tip at the cavoatrial junction. No focal consolidation, pleural effusion, or pneumothorax. The cardiac silhouette is within limits. No acute osseous pathology. Degenerative changes of the spine and scoliosis. IMPRESSION: No active cardiopulmonary disease. Electronically Signed   By: Elgie Collard M.D.   On: 12/09/2023 14:50    PERFORMANCE STATUS (ECOG) : 2 - Symptomatic, <50% confined to bed  Review of Systems Unless otherwise noted, a complete review of systems is negative.  Physical Exam General: NAD Cardiovascular: regular rate and rhythm Pulmonary: clear ant fields Abdomen: soft,  nontender, + bowel sounds GU: no suprapubic tenderness Extremities: no edema, no joint deformities Skin: no rashes Neurological: Weakness but otherwise nonfocal  IMPRESSION: Patient has had recurrent high-grade urothelial cancer status post previous TURBTs.  Patient met with medical oncology today who discussed option of single agent immunotherapy versus best supportive care.  I met with patient and daughter to discuss goals.  Despite dementia, patient did have reasonable recall of her discussion with Dr. Orlie Dakin earlier this morning.  She understands that options for treatment of urothelial cancer are limited.  However, she states that she is "not ready to die" and would like to try something for treatment if possible.  She is in agreement with immunotherapy and future cystoscopies but does not want to undergo additional surgical procedures.  In the event that immunotherapy is ineffective, we discussed future option of hospice and best supportive care.  Patient also desires comfort and symptom management through treatment.  Symptomatically, patient is having active diarrhea.  Has been prescribed Lomotil by Dr. Orlie Dakin.  Recently on antibiotics so we will check stool studies and C. difficile PCR.  Discussed CODE STATUS in detail.  Patient states clearly that she would not want to be resuscitated or have her life prolonged artificially on machines.  Both patient and daughter were in agreement DNR/DNI.  DNR order signed today.  PLAN: -Continue current scope of treatment -GI panel/C. Difficile -DNR/DNI -Referral to community PC/discuss dementia program -RTC 2 to 3 weeks   Patient expressed understanding and was in agreement with this plan. She also understands that She can call the clinic at any time with any questions, concerns, or complaints.     Time Total: 20 minutes  Visit consisted of counseling and education dealing with the complex and emotionally intense issues of symptom  management and palliative care in the setting of serious and potentially life-threatening illness.Greater than 50%  of this time was spent counseling and coordinating care related to the above assessment and plan.  Signed by: Laurette Schimke, PhD, NP-C

## 2024-01-02 NOTE — Telephone Encounter (Signed)
 We would like to have specimen the same day that it is collected. If you get it late at night to please bring it back in am as soon to get here . Daughter knows this , I spoke to her on phone

## 2024-01-03 ENCOUNTER — Other Ambulatory Visit: Payer: Self-pay | Admitting: Obstetrics and Gynecology

## 2024-01-03 ENCOUNTER — Other Ambulatory Visit: Payer: Self-pay

## 2024-01-03 ENCOUNTER — Other Ambulatory Visit: Payer: Self-pay | Admitting: Hospice and Palliative Medicine

## 2024-01-03 ENCOUNTER — Telehealth: Payer: Self-pay

## 2024-01-03 DIAGNOSIS — N390 Urinary tract infection, site not specified: Secondary | ICD-10-CM

## 2024-01-03 DIAGNOSIS — Z01419 Encounter for gynecological examination (general) (routine) without abnormal findings: Secondary | ICD-10-CM

## 2024-01-03 DIAGNOSIS — R197 Diarrhea, unspecified: Secondary | ICD-10-CM

## 2024-01-03 DIAGNOSIS — Z5112 Encounter for antineoplastic immunotherapy: Secondary | ICD-10-CM | POA: Diagnosis not present

## 2024-01-03 LAB — GASTROINTESTINAL PANEL BY PCR, STOOL (REPLACES STOOL CULTURE)

## 2024-01-03 LAB — C DIFFICILE QUICK SCREEN W PCR REFLEX
C Diff antigen: POSITIVE — AB
C Diff toxin: NEGATIVE

## 2024-01-03 LAB — CLOSTRIDIUM DIFFICILE BY PCR, REFLEXED: Toxigenic C. Difficile by PCR: POSITIVE — AB

## 2024-01-03 MED ORDER — VANCOMYCIN HCL 125 MG PO CAPS: 125.0000 mg | ORAL_CAPSULE | Freq: Four times a day (QID) | ORAL | 0 refills | Status: DC

## 2024-01-03 NOTE — Telephone Encounter (Signed)
 Patient's daughter, Joni Reining, notified of Sharia Reeve B recommendation of results of C diff + and rx for oral vancomycin x 10 days sent to pharmacy. Keep appointments as scheduled.  Reminded daughter of hand hygiene during care for her mother during this time.

## 2024-01-07 ENCOUNTER — Other Ambulatory Visit: Payer: Self-pay | Admitting: Oncology

## 2024-01-09 ENCOUNTER — Encounter: Payer: Self-pay | Admitting: Oncology

## 2024-01-09 ENCOUNTER — Inpatient Hospital Stay

## 2024-01-09 ENCOUNTER — Inpatient Hospital Stay (HOSPITAL_BASED_OUTPATIENT_CLINIC_OR_DEPARTMENT_OTHER): Admitting: Oncology

## 2024-01-09 VITALS — BP 142/63 | HR 73 | Temp 98.2°F | Resp 16 | Ht <= 58 in | Wt 99.5 lb

## 2024-01-09 VITALS — BP 125/61 | HR 76 | Resp 16

## 2024-01-09 DIAGNOSIS — C642 Malignant neoplasm of left kidney, except renal pelvis: Secondary | ICD-10-CM

## 2024-01-09 DIAGNOSIS — Z5112 Encounter for antineoplastic immunotherapy: Secondary | ICD-10-CM | POA: Diagnosis not present

## 2024-01-09 LAB — CBC WITH DIFFERENTIAL (CANCER CENTER ONLY)
Abs Immature Granulocytes: 0.27 10*3/uL — ABNORMAL HIGH (ref 0.00–0.07)
Basophils Absolute: 0.1 10*3/uL (ref 0.0–0.1)
Basophils Relative: 1 %
Eosinophils Absolute: 0.3 10*3/uL (ref 0.0–0.5)
Eosinophils Relative: 2 %
HCT: 31 % — ABNORMAL LOW (ref 36.0–46.0)
Hemoglobin: 9.9 g/dL — ABNORMAL LOW (ref 12.0–15.0)
Immature Granulocytes: 3 %
Lymphocytes Relative: 19 %
Lymphs Abs: 2 10*3/uL (ref 0.7–4.0)
MCH: 28.2 pg (ref 26.0–34.0)
MCHC: 31.9 g/dL (ref 30.0–36.0)
MCV: 88.3 fL (ref 80.0–100.0)
Monocytes Absolute: 1 10*3/uL (ref 0.1–1.0)
Monocytes Relative: 9 %
Neutro Abs: 7.1 10*3/uL (ref 1.7–7.7)
Neutrophils Relative %: 66 %
Platelet Count: 293 10*3/uL (ref 150–400)
RBC: 3.51 MIL/uL — ABNORMAL LOW (ref 3.87–5.11)
RDW: 14.3 % (ref 11.5–15.5)
WBC Count: 10.6 10*3/uL — ABNORMAL HIGH (ref 4.0–10.5)
nRBC: 0 % (ref 0.0–0.2)

## 2024-01-09 LAB — CMP (CANCER CENTER ONLY)
ALT: 10 U/L (ref 0–44)
AST: 17 U/L (ref 15–41)
Albumin: 3.2 g/dL — ABNORMAL LOW (ref 3.5–5.0)
Alkaline Phosphatase: 62 U/L (ref 38–126)
Anion gap: 11 (ref 5–15)
BUN: 18 mg/dL (ref 8–23)
CO2: 22 mmol/L (ref 22–32)
Calcium: 8.9 mg/dL (ref 8.9–10.3)
Chloride: 105 mmol/L (ref 98–111)
Creatinine: 1.9 mg/dL — ABNORMAL HIGH (ref 0.44–1.00)
GFR, Estimated: 27 mL/min — ABNORMAL LOW (ref >60)
Glucose, Bld: 116 mg/dL — ABNORMAL HIGH (ref 70–99)
Potassium: 4.6 mmol/L (ref 3.5–5.1)
Sodium: 138 mmol/L (ref 135–145)
Total Bilirubin: 0.4 mg/dL (ref 0.0–1.2)
Total Protein: 6.3 g/dL — ABNORMAL LOW (ref 6.5–8.1)

## 2024-01-09 LAB — TSH: TSH: 1.792 u[IU]/mL (ref 0.350–4.500)

## 2024-01-09 MED ORDER — SODIUM CHLORIDE 0.9 % IV SOLN
INTRAVENOUS | Status: DC
  Filled 2024-01-09: qty 250

## 2024-01-09 MED ORDER — PEMBROLIZUMAB CHEMO INJECTION 100 MG/4ML
200.0000 mg | Freq: Once | INTRAVENOUS | Status: AC
  Administered 2024-01-09: 200 mg via INTRAVENOUS
  Filled 2024-01-09: qty 200

## 2024-01-09 MED ORDER — SODIUM CHLORIDE 0.9% FLUSH
10.0000 mL | INTRAVENOUS | Status: DC | PRN
  Filled 2024-01-09: qty 10

## 2024-01-09 MED ORDER — HEPARIN SOD (PORK) LOCK FLUSH 100 UNIT/ML IV SOLN
500.0000 [IU] | Freq: Once | INTRAVENOUS | Status: DC | PRN
  Filled 2024-01-09: qty 5

## 2024-01-09 NOTE — Patient Instructions (Signed)
 [facility] - A DEPT OF [facility]  Discharge Instructions: Thank you for choosing [facility] to provide your oncology and hematology care.  If you have a lab appointment with the Cancer Center, please go directly to the Cancer Center and check in at the registration area.  Wear comfortable clothing and clothing appropriate for easy access to any Portacath or PICC line.   We strive to give you quality time with your provider. You may need to reschedule your appointment if you arrive late (15 or more minutes).  Arriving late affects you and other patients whose appointments are after yours.  Also, if you miss three or more appointments without notifying the office, you may be dismissed from the clinic at the provider's discretion.      For prescription refill requests, have your pharmacy contact our office and allow 72 hours for refills to be completed.    Today you received the following chemotherapy and/or immunotherapy agents Pembrolizumab      To help prevent nausea and vomiting after your treatment, we encourage you to take your nausea medication as directed.  BELOW ARE SYMPTOMS THAT SHOULD BE REPORTED IMMEDIATELY: *FEVER GREATER THAN 100.4 F (38 C) OR HIGHER *CHILLS OR SWEATING *NAUSEA AND VOMITING THAT IS NOT CONTROLLED WITH YOUR NAUSEA MEDICATION *UNUSUAL SHORTNESS OF BREATH *UNUSUAL BRUISING OR BLEEDING *URINARY PROBLEMS (pain or burning when urinating, or frequent urination) *BOWEL PROBLEMS (unusual diarrhea, constipation, pain near the anus) TENDERNESS IN MOUTH AND THROAT WITH OR WITHOUT PRESENCE OF ULCERS (sore throat, sores in mouth, or a toothache) UNUSUAL RASH, SWELLING OR PAIN  UNUSUAL VAGINAL DISCHARGE OR ITCHING   Items with * indicate a potential emergency and should be followed up as soon as possible or go to the Emergency Department if any problems should occur.  Please show the CHEMOTHERAPY ALERT CARD or  IMMUNOTHERAPY ALERT CARD at check-in to the Emergency Department and triage nurse.  Should you have questions after your visit or need to cancel or reschedule your appointment, please contact [facility] - A DEPT OF [facility]  [phone] and follow the prompts.  Office hours are 8:00 a.m. to 4:30 p.m. Monday - Friday. Please note that voicemails left after 4:00 p.m. may not be returned until the following business day.  We are closed weekends and major holidays. You have access to a nurse at all times for urgent questions. Please call the main number to the clinic [phone] and follow the prompts.  For any non-urgent questions, you may also contact your provider using MyChart. We now offer e-Visits for anyone 6 and older to request care online for non-urgent symptoms. For details visit [email or web address].   Also download the MyChart app! Go to the app store, search "MyChart", open the app, select [facility], and log in with your MyChart username and password.

## 2024-01-09 NOTE — Progress Notes (Signed)
 [facility]  Telephone:([phone] Fax:(336) [phone]  ID: Jessica Morrison OB: 10/29/1945  MR#: [id number]  AOZ#:[id number]  Patient Care Team: Marguarite Arbour, MD as PCP - General (Internal Medicine) Jeralyn Ruths, MD as Consulting Physician (Oncology) Vanna Scotland, MD as Consulting Physician (Urology)  CHIEF COMPLAINT: Recurrent urothelial carcinoma of the kidney.  INTERVAL HISTORY: Patient returns to clinic today for further evaluation and initiation of cycle 1 of Keytruda.  She continues to feel well and is at her baseline. She continue to have a poor memory.  She has no neurologic complaints.  She denies any recent fevers or illnesses. She has no chest pain, shortness of breath, cough, or hemoptysis.  She denies any nausea, vomiting, or constipation.  She does not complain of diarrhea.  She continues to have significant diarrhea.  She has no urinary complaints.  Patient offers no further specific complaints today.  REVIEW OF SYSTEMS:   Review of Systems  Constitutional: Negative.  Negative for fever, malaise/fatigue and weight loss.  Respiratory: Negative.  Negative for cough and shortness of breath.   Cardiovascular: Negative.  Negative for chest pain and leg swelling.  Gastrointestinal: Negative.  Negative for abdominal pain and diarrhea.  Genitourinary: Negative.  Negative for hematuria.  Musculoskeletal: Negative.  Negative for back pain.  Skin: Negative.  Negative for rash.  Neurological: Negative.  Negative for dizziness, focal weakness, weakness and headaches.  Psychiatric/Behavioral:  Positive for memory loss. The patient is not nervous/anxious.     As per HPI. Otherwise, a complete review of systems is negative.  PAST MEDICAL HISTORY: Past Medical History:  Diagnosis Date   B12 deficiency    Back pain    Basal cell carcinoma 08/04/2020   0.5cm above the left mid brow, EDC 09/15/20   Cancer (HCC)    Melanoma   Curvature of spine     Dysplastic nevus 04/30/2013   left post shoulder, left parasternal   Dysplastic nevus 05/04/2014   right prox ant deltoid, left lat buttock   Dysplastic nevus 03/26/2019   right lat neck adjacent to the inf top of MM in situ site scar   Dysplastic nevus 10/29/2019   upper back left paraspinal at level of mid scapula   Dysplastic nevus 03/31/2020, 05/12/20 shave removal   Left mid pretibial. Moderate to severe atypia, deep margin involved.   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   right lat neck inf to right angle of mandible, excised: 07/29/2018, margins free   Memory loss    Mild mitral regurgitation by prior echocardiogram    Nephrolithiasis    Osteoarthritis    Osteopenia    Osteoporosis    Pelvic pain in female    PONV (postoperative nausea and vomiting)    Pre-diabetes    Raynaud disease    Renal cancer, left (HCC) 03/2023   Thyromegaly    Transitional cell carcinoma of kidney, left (HCC)    Urothelial carcinoma of kidney, left (HCC)     PAST SURGICAL HISTORY: Past Surgical History:  Procedure Laterality Date   ABDOMINAL  HYSTERECTOMY     BILATERAL OOPHORECTOMY  1999   BLADDER INSTILLATION N/A 04/01/2023   Procedure: BLADDER INSTILLATION OF GEMCITABINE;  Surgeon: Vanna Scotland, MD;  Location: [facility];  Service: Urology;  Laterality: N/A;   BLADDER INSTILLATION N/A 07/01/2023   Procedure: BLADDER INSTILLATION OF GEMCITABINE;  Surgeon: Vanna Scotland, MD;  Location: [facility];  Service: Urology;  Laterality: N/A;   CATARACT EXTRACTION W/PHACO Right 05/09/2023   Procedure: CATARACT EXTRACTION PHACO AND INTRAOCULAR LENS PLACEMENT (IOC)  RIGHT MALYUGIN OMIDRIA 7.79 00:59.0;  Surgeon: Estanislado Pandy, MD;  Location: [facility];  Service: Ophthalmology;  Laterality: Right;   CATARACT EXTRACTION W/PHACO Left 05/23/2023   Procedure: CATARACT EXTRACTION PHACO AND INTRAOCULAR LENS PLACEMENT (IOC) LEFT MALYUGIN OMIDRIA 12.13 1:23.6;  Surgeon: Estanislado Pandy, MD;  Location: [facility];  Service: Ophthalmology;  Laterality: Left;   CESAREAN SECTION     COLONOSCOPY  09/02/2020   COLONOSCOPY WITH PROPOFOL N/A 05/09/2015   Procedure: COLONOSCOPY WITH PROPOFOL;  Surgeon: Elnita Maxwell, MD;  Location: [facility];  Service: Endoscopy;  Laterality: N/A;   CYSTOSCOPY W/ RETROGRADES Right 07/01/2023   Procedure: CYSTOSCOPY WITH RETROGRADE PYELOGRAM;  Surgeon: Vanna Scotland, MD;  Location: [facility];  Service: Urology;  Laterality: Right;   CYSTOSCOPY WITH RETROGRADE PYELOGRAM, URETEROSCOPY AND STENT PLACEMENT Left 10/09/2022   Procedure: CYSTOSCOPY WITH RETROGRADE PYELOGRAM, URETEROSCOPY AND STENT PLACEMENT;  Surgeon: Riki Altes, MD;  Location: [facility];  Service: Urology;  Laterality: Left;   IR IMAGING GUIDED PORT INSERTION  12/06/2022   MELANOMA EXCISION Right 07/29/2018   lateral neck inf. to right angle of mandible   OOPHORECTOMY     ROBOT ASSITED LAPAROSCOPIC NEPHROURETERECTOMY Left 04/01/2023   Procedure: XI ROBOT ASSITED LAPAROSCOPIC NEPHROURETERECTOMY;  Surgeon: Vanna Scotland, MD;  Location: [facility];  Service: Urology;  Laterality: Left;   TRANSURETHRAL RESECTION OF BLADDER TUMOR N/A 07/01/2023   Procedure: TRANSURETHRAL RESECTION OF BLADDER TUMOR (TURBT);  Surgeon: Vanna Scotland, MD;  Location: [facility];  Service: Urology;  Laterality: N/A;   TRANSURETHRAL RESECTION OF BLADDER TUMOR N/A 12/23/2023   Procedure: TURBT (TRANSURETHRAL RESECTION OF BLADDER TUMOR);  Surgeon: Vanna Scotland, MD;  Location: [facility];  Service: Urology;  Laterality: N/A;   URETERAL BIOPSY Left 10/09/2022    Procedure: URETERAL BIOPSY;  Surgeon: Riki Altes, MD;  Location: [facility];  Service: Urology;  Laterality: Left;    FAMILY HISTORY: Family History  Problem Relation Age of Onset   Emphysema Father    Cancer Neg Hx    Heart disease Neg Hx    Diabetes Neg Hx    Breast cancer Neg Hx     ADVANCED DIRECTIVES (Y/N):  N  HEALTH MAINTENANCE: Social History   Tobacco Use   Smoking status: Never    Passive exposure: Never   Smokeless tobacco: Never  Vaping Use   Vaping status: Never Used  Substance Use Topics   Alcohol use: No   Drug use: No     Colonoscopy:  PAP:  Bone density:  Lipid panel:  Allergies  Allergen Reactions   Codeine Nausea And Vomiting   Neosporin Wound Cleanser [Benzalkonium Chloride] Other (See Comments)    Skin blisters   Penicillins Hives    Current Outpatient Medications  Medication Sig Dispense Refill   acetaminophen (TYLENOL) 325 MG tablet Take 650 mg by mouth every 6 (six) hours as needed for moderate pain (pain score 4-6).     calcium-vitamin D (OSCAL WITH D) 500-200 MG-UNIT tablet 1  tablet daily with breakfast.     Cholecalciferol 25 MCG (1000 UT) tablet Take 1,000 Units by mouth daily.     cyanocobalamin 1000 MCG tablet Take 1,000 mcg by mouth daily.     desvenlafaxine (PRISTIQ) 25 MG 24 hr tablet Take 25 mg by mouth daily.     diphenoxylate-atropine (LOMOTIL) 2.5-0.025 MG tablet TAKE ONE TABLET BY MOUTH FOUR (FOUR) TIMES DAILY AS NEEDED FOR DIARRHEA OR LOOSE STOOLS. 30 tablet 0   donepezil (ARICEPT) 10 MG tablet Take 10 mg by mouth every evening.     memantine (NAMENDA) 5 MG tablet Take 5 mg by mouth in the morning and at bedtime.     omeprazole (PRILOSEC) 40 MG capsule 40 mg daily.     oxybutynin (DITROPAN) 5 MG tablet Take 1 tablet (5 mg total) by mouth every 8 (eight) hours as needed for bladder spasms. 30 tablet 0   raloxifene (EVISTA) 60 MG tablet TAKE ONE TABLET BY MOUTH ONCE A DAY 90 tablet 2   rosuvastatin (CRESTOR) 5 MG tablet  Take 5 mg by mouth daily.     vancomycin (VANCOCIN) 125 MG capsule Take 1 capsule (125 mg total) by mouth 4 (four) times daily. 40 capsule 0   No current facility-administered medications for this visit.    OBJECTIVE: Vitals:   01/09/24 0903  BP: (!) 142/63  Pulse: 73  Resp: 16  Temp: 98.2 F (36.8 C)  SpO2: 100%     Body mass index is 21.53 kg/m.    ECOG FS:0 - Asymptomatic  General: Well-developed, well-nourished, no acute distress. Eyes: Pink conjunctiva, anicteric sclera. HEENT: Normocephalic, moist mucous membranes. Lungs: No audible wheezing or coughing. Heart: Regular rate and rhythm. Abdomen: Soft, nontender, no obvious distention. Musculoskeletal: No edema, cyanosis, or clubbing. Neuro: Alert, answering all questions appropriately. Cranial nerves grossly intact. Skin: No rashes or petechiae noted. Psych: Normal affect.  LAB RESULTS:  Lab Results  Component Value Date   NA 138 01/09/2024   K 4.6 01/09/2024   CL 105 01/09/2024   CO2 22 01/09/2024   GLUCOSE 116 (H) 01/09/2024   BUN 18 01/09/2024   CREATININE 1.90 (H) 01/09/2024   CALCIUM 8.9 01/09/2024   PROT 6.3 (L) 01/09/2024   ALBUMIN 3.2 (L) 01/09/2024   AST 17 01/09/2024   ALT 10 01/09/2024   ALKPHOS 62 01/09/2024   BILITOT 0.4 01/09/2024   GFRNONAA 27 (L) 01/09/2024    Lab Results  Component Value Date   WBC 10.6 (H) 01/09/2024   NEUTROABS 7.1 01/09/2024   HGB 9.9 (L) 01/09/2024   HCT 31.0 (L) 01/09/2024   MCV 88.3 01/09/2024   PLT 293 01/09/2024     STUDIES: No results found.   ASSESSMENT: Recurrent urothelial carcinoma of the kidney.  PLAN:    Recurrent urothelial carcinoma of the kidney:  Patient completed 4 cycles of neoadjuvant cisplatin and gemcitabine on Feb 19, 2023.  She underwent complete R0 surgical resection on April 01, 2023.  Patient also recently underwent BCG treatments for noninvasive lesions in her bladder.  Repeat cystoscopy on December 23, 2023 revealed a recurrence of a  high-grade invasive urothelial carcinoma.  After lengthy discussion with the patient and her daughter, is agreed upon that surgery and chemotherapy were no longer options for treatment.  She has agreed to single agent Keytruda every 3 weeks.  Hospice and comfort care were also discussed, but patient would like to try treatment first.  She expressed understanding that there is likely no further treatment options  after the immunotherapy.  Proceed with cycle 1 of Keytruda today.  Return to clinic in 3 weeks for further evaluation and consideration of cycle 2. Memory loss: Chronic and unchanged.  Continue follow-up with neurology as indicated.   Renal insufficiency: Chronic and unchanged.  Patient's GFR remains stable at 27.  Rande Lawman does not need to be dose reduced due to renal insufficiency. Diarrhea: Patient found to be colonized with C. difficile and initiated treatment with oral vancomycin.  Her daughter reports diarrhea still evident, but mildly improved.  They will complete treatment on Sunday.  Continue Lomotil as prescribed.   I spent a total of 30 minutes reviewing chart data, face-to-face evaluation with the patient, counseling and coordination of care as detailed above.  Patient expressed understanding and was in agreement with this plan. She also understands that She can call clinic at any time with any questions, concerns, or complaints.    Cancer Staging  Urothelial carcinoma of kidney [facility]) Staging form: Kidney, AJCC 8th Edition - Clinical stage from 12/01/2022: Stage Unknown (cTX, cN0, cM0) - Signed by Jeralyn Ruths, MD on 12/01/2022 Stage prefix: Initial diagnosis Histologic grade (G): G3 Histologic grading system: 4 grade system   Jeralyn Ruths, MD   01/09/2024 9:41 AM

## 2024-01-10 LAB — T4: T4, Total: 9.2 ug/dL (ref 4.5–12.0)

## 2024-01-13 ENCOUNTER — Encounter: Payer: Self-pay | Admitting: Oncology

## 2024-01-20 ENCOUNTER — Other Ambulatory Visit: Payer: Self-pay | Admitting: *Deleted

## 2024-01-20 DIAGNOSIS — C689 Malignant neoplasm of urinary organ, unspecified: Secondary | ICD-10-CM

## 2024-01-20 MED ORDER — DIPHENOXYLATE-ATROPINE 2.5-0.025 MG PO TABS: 1.0000 | ORAL_TABLET | Freq: Four times a day (QID) | ORAL | 0 refills | Status: DC | PRN

## 2024-01-22 ENCOUNTER — Ambulatory Visit
Admission: RE | Admit: 2024-01-22 | Discharge: 2024-01-22 | Disposition: A | Discharge status: Home / Self Care | Source: Ambulatory Visit | Attending: Oncology | Admitting: Oncology

## 2024-01-22 DIAGNOSIS — Z905 Acquired absence of kidney: Secondary | ICD-10-CM | POA: Diagnosis not present

## 2024-01-22 DIAGNOSIS — R109 Unspecified abdominal pain: Secondary | ICD-10-CM | POA: Diagnosis not present

## 2024-01-22 DIAGNOSIS — R197 Diarrhea, unspecified: Secondary | ICD-10-CM | POA: Diagnosis not present

## 2024-01-22 DIAGNOSIS — C689 Malignant neoplasm of urinary organ, unspecified: Secondary | ICD-10-CM

## 2024-01-30 ENCOUNTER — Inpatient Hospital Stay

## 2024-01-30 ENCOUNTER — Inpatient Hospital Stay: Attending: Oncology | Admitting: Oncology

## 2024-01-30 ENCOUNTER — Inpatient Hospital Stay: Attending: Oncology

## 2024-01-30 ENCOUNTER — Encounter: Payer: Self-pay | Admitting: Oncology

## 2024-01-30 ENCOUNTER — Encounter: Payer: Self-pay | Admitting: Urology

## 2024-01-30 VITALS — BP 128/67 | HR 76 | Temp 98.3°F | Resp 16 | Ht <= 58 in | Wt 96.8 lb

## 2024-01-30 DIAGNOSIS — Z79620 Long term (current) use of immunosuppressive biologic: Secondary | ICD-10-CM | POA: Insufficient documentation

## 2024-01-30 DIAGNOSIS — Z5112 Encounter for antineoplastic immunotherapy: Secondary | ICD-10-CM | POA: Insufficient documentation

## 2024-01-30 DIAGNOSIS — C642 Malignant neoplasm of left kidney, except renal pelvis: Secondary | ICD-10-CM | POA: Insufficient documentation

## 2024-01-30 DIAGNOSIS — R197 Diarrhea, unspecified: Secondary | ICD-10-CM | POA: Diagnosis not present

## 2024-01-30 LAB — CBC WITH DIFFERENTIAL (CANCER CENTER ONLY)
Abs Immature Granulocytes: 0.16 10*3/uL — ABNORMAL HIGH (ref 0.00–0.07)
Basophils Absolute: 0.1 10*3/uL (ref 0.0–0.1)
Basophils Relative: 1 %
Eosinophils Absolute: 0.2 10*3/uL (ref 0.0–0.5)
Eosinophils Relative: 2 %
HCT: 30.5 % — ABNORMAL LOW (ref 36.0–46.0)
Hemoglobin: 9.7 g/dL — ABNORMAL LOW (ref 12.0–15.0)
Immature Granulocytes: 1 %
Lymphocytes Relative: 14 %
Lymphs Abs: 1.6 10*3/uL (ref 0.7–4.0)
MCH: 28.1 pg (ref 26.0–34.0)
MCHC: 31.8 g/dL (ref 30.0–36.0)
MCV: 88.4 fL (ref 80.0–100.0)
Monocytes Absolute: 0.9 10*3/uL (ref 0.1–1.0)
Monocytes Relative: 8 %
Neutro Abs: 8.4 10*3/uL — ABNORMAL HIGH (ref 1.7–7.7)
Neutrophils Relative %: 74 %
Platelet Count: 241 10*3/uL (ref 150–400)
RBC: 3.45 MIL/uL — ABNORMAL LOW (ref 3.87–5.11)
RDW: 14.8 % (ref 11.5–15.5)
WBC Count: 11.4 10*3/uL — ABNORMAL HIGH (ref 4.0–10.5)
nRBC: 0 % (ref 0.0–0.2)

## 2024-01-30 LAB — CMP (CANCER CENTER ONLY)
ALT: 9 U/L (ref 0–44)
AST: 14 U/L — ABNORMAL LOW (ref 15–41)
Albumin: 3.1 g/dL — ABNORMAL LOW (ref 3.5–5.0)
Alkaline Phosphatase: 56 U/L (ref 38–126)
Anion gap: 8 (ref 5–15)
BUN: 26 mg/dL — ABNORMAL HIGH (ref 8–23)
CO2: 21 mmol/L — ABNORMAL LOW (ref 22–32)
Calcium: 8.6 mg/dL — ABNORMAL LOW (ref 8.9–10.3)
Chloride: 107 mmol/L (ref 98–111)
Creatinine: 1.67 mg/dL — ABNORMAL HIGH (ref 0.44–1.00)
GFR, Estimated: 31 mL/min — ABNORMAL LOW (ref >60)
Glucose, Bld: 102 mg/dL — ABNORMAL HIGH (ref 70–99)
Potassium: 4.4 mmol/L (ref 3.5–5.1)
Sodium: 136 mmol/L (ref 135–145)
Total Bilirubin: 0.5 mg/dL (ref 0.0–1.2)
Total Protein: 5.9 g/dL — ABNORMAL LOW (ref 6.5–8.1)

## 2024-01-30 MED ORDER — SODIUM CHLORIDE 0.9 % IV SOLN
200.0000 mg | Freq: Once | INTRAVENOUS | Status: AC
  Administered 2024-01-30: 200 mg via INTRAVENOUS
  Filled 2024-01-30: qty 8

## 2024-01-30 MED ORDER — SODIUM CHLORIDE 0.9% FLUSH
10.0000 mL | INTRAVENOUS | Status: DC | PRN
  Administered 2024-01-30: 10 mL
  Filled 2024-01-30: qty 10

## 2024-01-30 MED ORDER — SODIUM CHLORIDE 0.9 % IV SOLN
INTRAVENOUS | Status: DC
  Filled 2024-01-30 (×2): qty 250

## 2024-01-30 MED ORDER — HEPARIN SOD (PORK) LOCK FLUSH 100 UNIT/ML IV SOLN
500.0000 [IU] | Freq: Once | INTRAVENOUS | Status: AC | PRN
Start: 2024-01-30 — End: 2024-01-30
  Administered 2024-01-30: 500 [IU]
  Filled 2024-01-30: qty 5

## 2024-01-30 NOTE — Progress Notes (Signed)
 Still having diarrhea and stomach issues.

## 2024-01-30 NOTE — Progress Notes (Signed)
 [facility]  Telephone:(336) [phone] Fax:(336) [phone]  ID: Jessica Morrison OB: 1946/10/06  MR#: [id number]  AOZ#:[id number]  Patient Care Team: Marguarite Arbour, MD as PCP - General (Internal Medicine) Jeralyn Ruths, MD as Consulting Physician (Oncology) Vanna Scotland, MD as Consulting Physician (Urology)  CHIEF COMPLAINT: Recurrent urothelial carcinoma of the kidney.  INTERVAL HISTORY: Patient returns to clinic today for further evaluation and consideration of cycle 2 of Keytruda.  She continues to have diarrhea, but this has improved over the past several weeks.  She continue to have a poor memory.  She has no neurologic complaints.  She denies any recent fevers or illnesses. She has no chest pain, shortness of breath, cough, or hemoptysis.  She denies any nausea, vomiting, or constipation. She has no urinary complaints.  Patient offers no further specific complaints today.  REVIEW OF SYSTEMS:   Review of Systems  Constitutional: Negative.  Negative for fever, malaise/fatigue and weight loss.  Respiratory: Negative.  Negative for cough and shortness of breath.   Cardiovascular: Negative.  Negative for chest pain and leg swelling.  Gastrointestinal:  Positive for diarrhea. Negative for abdominal pain.  Genitourinary: Negative.  Negative for hematuria.  Musculoskeletal: Negative.  Negative for back pain.  Skin: Negative.  Negative for rash.  Neurological: Negative.  Negative for dizziness, focal weakness, weakness and headaches.  Psychiatric/Behavioral:  Positive for memory loss. The patient is not nervous/anxious.     As per HPI. Otherwise, a complete review of systems is negative.  PAST MEDICAL HISTORY: Past Medical History:  Diagnosis Date   B12 deficiency    Back pain    Basal cell carcinoma 08/04/2020   0.5cm above the left mid brow, EDC 09/15/20   Cancer (HCC)    Melanoma   Curvature of spine    Dysplastic nevus 04/30/2013   left post  shoulder, left parasternal   Dysplastic nevus 05/04/2014   right prox ant deltoid, left lat buttock   Dysplastic nevus 03/26/2019   right lat neck adjacent to the inf top of MM in situ site scar   Dysplastic nevus 10/29/2019   upper back left paraspinal at level of mid scapula   Dysplastic nevus 03/31/2020, 05/12/20 shave removal   Left mid pretibial. Moderate to severe atypia, deep margin involved.   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   right lat neck inf to right angle of mandible, excised: 07/29/2018, margins free   Memory loss    Mild mitral regurgitation by prior echocardiogram    Nephrolithiasis    Osteoarthritis    Osteopenia    Osteoporosis    Pelvic pain in female    PONV (postoperative nausea and vomiting)    Pre-diabetes    Raynaud disease    Renal cancer, left (HCC) 03/2023   Thyromegaly    Transitional cell carcinoma of kidney, left (HCC)    Urothelial carcinoma of kidney, left (HCC)     PAST SURGICAL HISTORY: Past Surgical History:  Procedure Laterality Date   ABDOMINAL HYSTERECTOMY     BILATERAL OOPHORECTOMY  1999  BLADDER INSTILLATION N/A 04/01/2023   Procedure: BLADDER INSTILLATION OF GEMCITABINE;  Surgeon: Vanna Scotland, MD;  Location: [facility];  Service: Urology;  Laterality: N/A;   BLADDER INSTILLATION N/A 07/01/2023   Procedure: BLADDER INSTILLATION OF GEMCITABINE;  Surgeon: Vanna Scotland, MD;  Location: [facility];  Service: Urology;  Laterality: N/A;   CATARACT EXTRACTION W/PHACO Right 05/09/2023   Procedure: CATARACT EXTRACTION PHACO AND INTRAOCULAR LENS PLACEMENT (IOC) RIGHT MALYUGIN OMIDRIA 7.79 00:59.0;   Surgeon: Estanislado Pandy, MD;  Location: [facility];  Service: Ophthalmology;  Laterality: Right;   CATARACT EXTRACTION W/PHACO Left 05/23/2023   Procedure: CATARACT EXTRACTION PHACO AND INTRAOCULAR LENS PLACEMENT (IOC) LEFT MALYUGIN OMIDRIA 12.13 1:23.6;  Surgeon: Estanislado Pandy, MD;  Location: [facility];  Service: Ophthalmology;  Laterality: Left;   CESAREAN SECTION     COLONOSCOPY  09/02/2020   COLONOSCOPY WITH PROPOFOL N/A 05/09/2015   Procedure: COLONOSCOPY WITH PROPOFOL;  Surgeon: Elnita Maxwell, MD;  Location: [facility];  Service: Endoscopy;  Laterality: N/A;   CYSTOSCOPY W/ RETROGRADES Right 07/01/2023   Procedure: CYSTOSCOPY WITH RETROGRADE PYELOGRAM;  Surgeon: Vanna Scotland, MD;  Location: [facility];  Service: Urology;  Laterality: Right;   CYSTOSCOPY WITH RETROGRADE PYELOGRAM, URETEROSCOPY AND STENT PLACEMENT Left 10/09/2022   Procedure: CYSTOSCOPY WITH RETROGRADE PYELOGRAM, URETEROSCOPY AND STENT PLACEMENT;  Surgeon: Riki Altes, MD;  Location: [facility];  Service: Urology;  Laterality: Left;   IR IMAGING GUIDED PORT INSERTION  12/06/2022   MELANOMA EXCISION Right 07/29/2018   lateral neck inf. to right angle of mandible   OOPHORECTOMY     ROBOT ASSITED LAPAROSCOPIC NEPHROURETERECTOMY Left 04/01/2023   Procedure: XI ROBOT ASSITED LAPAROSCOPIC NEPHROURETERECTOMY;  Surgeon: Vanna Scotland, MD;  Location: [facility];  Service: Urology;  Laterality: Left;   TRANSURETHRAL RESECTION OF BLADDER TUMOR N/A 07/01/2023   Procedure: TRANSURETHRAL RESECTION OF BLADDER TUMOR (TURBT);  Surgeon: Vanna Scotland, MD;  Location: [facility];  Service: Urology;  Laterality: N/A;   TRANSURETHRAL RESECTION OF BLADDER TUMOR N/A 12/23/2023   Procedure: TURBT (TRANSURETHRAL RESECTION OF BLADDER TUMOR);  Surgeon: Vanna Scotland, MD;  Location: [facility];  Service: Urology;  Laterality: N/A;   URETERAL BIOPSY Left 10/09/2022   Procedure: URETERAL BIOPSY;  Surgeon:  Riki Altes, MD;  Location: [facility];  Service: Urology;  Laterality: Left;    FAMILY HISTORY: Family History  Problem Relation Age of Onset   Emphysema Father    Cancer Neg Hx    Heart disease Neg Hx    Diabetes Neg Hx    Breast cancer Neg Hx     ADVANCED DIRECTIVES (Y/N):  N  HEALTH MAINTENANCE: Social History   Tobacco Use   Smoking status: Never    Passive exposure: Never   Smokeless tobacco: Never  Vaping Use   Vaping status: Never Used  Substance Use Topics   Alcohol use: No   Drug use: No     Colonoscopy:  PAP:  Bone density:  Lipid panel:  Allergies  Allergen Reactions   Codeine Nausea And Vomiting   Neosporin Wound Cleanser [Benzalkonium Chloride] Other (See Comments)    Skin blisters   Penicillins Hives    Current Outpatient Medications  Medication Sig Dispense Refill   acetaminophen (TYLENOL) 325 MG tablet Take 650 mg by mouth every 6 (six) hours as needed for moderate pain (pain score 4-6).     calcium-vitamin D (OSCAL WITH D) 500-200 MG-UNIT tablet 1 tablet daily with breakfast.     Cholecalciferol 25 MCG (  1000 UT) tablet Take 1,000 Units by mouth daily.     cyanocobalamin 1000 MCG tablet Take 1,000 mcg by mouth daily.     desvenlafaxine (PRISTIQ) 25 MG 24 hr tablet Take 25 mg by mouth daily.     diphenoxylate-atropine (LOMOTIL) 2.5-0.025 MG tablet Take 1 tablet by mouth 4 (four) times daily as needed for diarrhea or loose stools. TAKE ONE TABLET BY MOUTH FOUR (FOUR) TIMES DAILY AS NEEDED FOR DIARRHEA OR LOOSE STOOLS. 30 tablet 0   donepezil (ARICEPT) 10 MG tablet Take 10 mg by mouth every evening.     memantine (NAMENDA) 5 MG tablet Take 5 mg by mouth in the morning and at bedtime.     omeprazole (PRILOSEC) 40 MG capsule 40 mg daily.     oxybutynin (DITROPAN) 5 MG tablet Take 1 tablet (5 mg total) by mouth every 8 (eight) hours as needed for bladder spasms. 30 tablet 0   raloxifene (EVISTA) 60 MG tablet TAKE ONE TABLET BY MOUTH ONCE A DAY 90  tablet 2   rosuvastatin (CRESTOR) 5 MG tablet Take 5 mg by mouth daily.     vancomycin (VANCOCIN) 125 MG capsule Take 1 capsule (125 mg total) by mouth 4 (four) times daily. (Patient not taking: Reported on 01/30/2024) 40 capsule 0   No current facility-administered medications for this visit.   Facility-Administered Medications Ordered in Other Visits  Medication Dose Route Frequency Provider Last Rate Last Admin   0.9 %  sodium chloride infusion   Intravenous Continuous Shellie Dials, MD   Stopped at 01/30/24 1209   sodium chloride flush (NS) 0.9 % injection 10 mL  10 mL Intracatheter PRN Shellie Dials, MD   10 mL at 01/30/24 1215    OBJECTIVE: Vitals:   01/30/24 0943  BP: 128/67  Pulse: 76  Resp: 16  Temp: 98.3 F (36.8 C)  SpO2: 100%     Body mass index is 20.95 kg/m.    ECOG FS:0 - Asymptomatic  General: Well-developed, well-nourished, no acute distress. Eyes: Pink conjunctiva, anicteric sclera. HEENT: Normocephalic, moist mucous membranes. Lungs: No audible wheezing or coughing. Heart: Regular rate and rhythm. Abdomen: Soft, nontender, no obvious distention. Musculoskeletal: No edema, cyanosis, or clubbing. Neuro: Alert, answering all questions appropriately. Cranial nerves grossly intact. Skin: No rashes or petechiae noted. Psych: Normal affect.  LAB RESULTS:  Lab Results  Component Value Date   NA 136 01/30/2024   K 4.4 01/30/2024   CL 107 01/30/2024   CO2 21 (L) 01/30/2024   GLUCOSE 102 (H) 01/30/2024   BUN 26 (H) 01/30/2024   CREATININE 1.67 (H) 01/30/2024   CALCIUM 8.6 (L) 01/30/2024   PROT 5.9 (L) 01/30/2024   ALBUMIN 3.1 (L) 01/30/2024   AST 14 (L) 01/30/2024   ALT 9 01/30/2024   ALKPHOS 56 01/30/2024   BILITOT 0.5 01/30/2024   GFRNONAA 31 (L) 01/30/2024    Lab Results  Component Value Date   WBC 11.4 (H) 01/30/2024   NEUTROABS 8.4 (H) 01/30/2024   HGB 9.7 (L) 01/30/2024   HCT 30.5 (L) 01/30/2024   MCV 88.4 01/30/2024   PLT 241  01/30/2024     STUDIES: No results found.   ASSESSMENT: Recurrent urothelial carcinoma of the kidney.  PLAN:    Recurrent urothelial carcinoma of the kidney:  Patient completed 4 cycles of neoadjuvant cisplatin and gemcitabine on Feb 19, 2023.  She underwent complete R0 surgical resection on April 01, 2023.  Patient also recently underwent BCG treatments for noninvasive  lesions in her bladder.  Repeat cystoscopy on December 23, 2023 revealed a recurrence of a high-grade invasive urothelial carcinoma.  After lengthy discussion with the patient and her daughter, is agreed upon that surgery and chemotherapy were no longer options for treatment.  She has agreed to single agent Keytruda every 3 weeks.  Hospice and comfort care were previously discussed, but patient would like to try treatment first.  She expressed understanding that there is likely no further treatment options after the immunotherapy.  Proceed with cycle 2 of Keytruda today.  Return to clinic in 3 weeks for further evaluation and consideration of cycle 3. Memory loss: Chronic and unchanged.  Continue follow-up with neurology as indicated.   Renal insufficiency: GFR mildly improved to 31.  Keytruda does not need to be dose reduced due to renal insufficiency. Diarrhea: Mildly improved.  Unrelated to [facility] as symptoms were evident prior to initiating treatment.  Previously, patient found to be colonized with C. difficile and treated with oral vancomycin.  Continue Lomotil as prescribed and patient was given a referral to GI for further evaluation. Leukocytosis: Mild, monitor.  Likely reactive. Anemia: Chronic and unchanged.  Patient's most recent hemoglobin was 9.7.   Patient expressed understanding and was in agreement with this plan. She also understands that She can call clinic at any time with any questions, concerns, or complaints.    Cancer Staging  Urothelial carcinoma of kidney [facility]) Staging form: Kidney, AJCC 8th Edition -  Clinical stage from 12/01/2022: Stage Unknown (cTX, cN0, cM0) - Signed by Shellie Dials, MD on 12/01/2022 Stage prefix: Initial diagnosis Histologic grade (G): G3 Histologic grading system: 4 grade system   Shellie Dials, MD   01/30/2024 1:16 PM

## 2024-02-17 ENCOUNTER — Encounter: Payer: Self-pay | Admitting: Oncology

## 2024-02-17 ENCOUNTER — Telehealth: Payer: Self-pay | Admitting: *Deleted

## 2024-02-17 NOTE — Telephone Encounter (Signed)
 [facility] reaching out for clearance for patient Dr. Adrian Alba.  [name] wants to make sure that it is okay to do the cleaning and it is going to be May 7.  I checked with Nils Baseman and Dr. Adrian Alba and it is fine to get the cleaning.  I called the dentistry and let them know that she can get it on May 7 just for cleaning, [name] says that is great.

## 2024-02-20 ENCOUNTER — Encounter: Payer: Self-pay | Admitting: Oncology

## 2024-02-20 ENCOUNTER — Inpatient Hospital Stay: Admitting: Oncology

## 2024-02-20 ENCOUNTER — Inpatient Hospital Stay

## 2024-02-20 ENCOUNTER — Inpatient Hospital Stay: Attending: Oncology

## 2024-02-20 VITALS — BP 126/71 | HR 74 | Temp 98.2°F | Resp 16 | Ht <= 58 in | Wt 97.9 lb

## 2024-02-20 VITALS — BP 133/75

## 2024-02-20 DIAGNOSIS — C642 Malignant neoplasm of left kidney, except renal pelvis: Secondary | ICD-10-CM

## 2024-02-20 DIAGNOSIS — Z79620 Long term (current) use of immunosuppressive biologic: Secondary | ICD-10-CM | POA: Insufficient documentation

## 2024-02-20 DIAGNOSIS — D649 Anemia, unspecified: Secondary | ICD-10-CM | POA: Insufficient documentation

## 2024-02-20 DIAGNOSIS — Z5112 Encounter for antineoplastic immunotherapy: Secondary | ICD-10-CM | POA: Diagnosis not present

## 2024-02-20 LAB — CBC WITH DIFFERENTIAL (CANCER CENTER ONLY)
Abs Immature Granulocytes: 0.12 10*3/uL — ABNORMAL HIGH (ref 0.00–0.07)
Basophils Absolute: 0.1 10*3/uL (ref 0.0–0.1)
Basophils Relative: 1 %
Eosinophils Absolute: 0.2 10*3/uL (ref 0.0–0.5)
Eosinophils Relative: 3 %
HCT: 31.4 % — ABNORMAL LOW (ref 36.0–46.0)
Hemoglobin: 10 g/dL — ABNORMAL LOW (ref 12.0–15.0)
Immature Granulocytes: 2 %
Lymphocytes Relative: 23 %
Lymphs Abs: 1.7 10*3/uL (ref 0.7–4.0)
MCH: 27.4 pg (ref 26.0–34.0)
MCHC: 31.8 g/dL (ref 30.0–36.0)
MCV: 86 fL (ref 80.0–100.0)
Monocytes Absolute: 0.7 10*3/uL (ref 0.1–1.0)
Monocytes Relative: 10 %
Neutro Abs: 4.5 10*3/uL (ref 1.7–7.7)
Neutrophils Relative %: 61 %
Platelet Count: 184 10*3/uL (ref 150–400)
RBC: 3.65 MIL/uL — ABNORMAL LOW (ref 3.87–5.11)
RDW: 15.1 % (ref 11.5–15.5)
WBC Count: 7.3 10*3/uL (ref 4.0–10.5)
nRBC: 0 % (ref 0.0–0.2)

## 2024-02-20 LAB — TSH: TSH: 1.631 u[IU]/mL (ref 0.350–4.500)

## 2024-02-20 LAB — CMP (CANCER CENTER ONLY)
ALT: 9 U/L (ref 0–44)
AST: 16 U/L (ref 15–41)
Albumin: 3.8 g/dL (ref 3.5–5.0)
Alkaline Phosphatase: 50 U/L (ref 38–126)
Anion gap: 11 (ref 5–15)
BUN: 32 mg/dL — ABNORMAL HIGH (ref 8–23)
CO2: 20 mmol/L — ABNORMAL LOW (ref 22–32)
Calcium: 8.8 mg/dL — ABNORMAL LOW (ref 8.9–10.3)
Chloride: 108 mmol/L (ref 98–111)
Creatinine: 1.61 mg/dL — ABNORMAL HIGH (ref 0.44–1.00)
GFR, Estimated: 33 mL/min — ABNORMAL LOW (ref >60)
Glucose, Bld: 94 mg/dL (ref 70–99)
Potassium: 4.4 mmol/L (ref 3.5–5.1)
Sodium: 139 mmol/L (ref 135–145)
Total Bilirubin: 0.7 mg/dL (ref 0.0–1.2)
Total Protein: 6.6 g/dL (ref 6.5–8.1)

## 2024-02-20 MED ORDER — HEPARIN SOD (PORK) LOCK FLUSH 100 UNIT/ML IV SOLN
500.0000 [IU] | Freq: Once | INTRAVENOUS | Status: AC | PRN
Start: 2024-02-20 — End: 2024-02-20
  Administered 2024-02-20: 500 [IU]
  Filled 2024-02-20: qty 5

## 2024-02-20 MED ORDER — SODIUM CHLORIDE 0.9 % IV SOLN
200.0000 mg | Freq: Once | INTRAVENOUS | Status: AC
  Administered 2024-02-20: 200 mg via INTRAVENOUS
  Filled 2024-02-20: qty 8

## 2024-02-20 MED ORDER — SODIUM CHLORIDE 0.9 % IV SOLN
INTRAVENOUS | Status: DC
  Filled 2024-02-20: qty 250

## 2024-02-20 NOTE — Patient Instructions (Signed)
 [facility] - A DEPT OF [facility]  Discharge Instructions: Thank you for choosing [facility] to provide your oncology and hematology care.  If you have a lab appointment with the Cancer Center, please go directly to the Cancer Center and check in at the registration area.  Wear comfortable clothing and clothing appropriate for easy access to any Portacath or PICC line.   We strive to give you quality time with your provider. You may need to reschedule your appointment if you arrive late (15 or more minutes).  Arriving late affects you and other patients whose appointments are after yours.  Also, if you miss three or more appointments without notifying the office, you may be dismissed from the clinic at the provider's discretion.      For prescription refill requests, have your pharmacy contact our office and allow 72 hours for refills to be completed.    Today you received the following chemotherapy and/or immunotherapy agents Keytruda      To help prevent nausea and vomiting after your treatment, we encourage you to take your nausea medication as directed.  BELOW ARE SYMPTOMS THAT SHOULD BE REPORTED IMMEDIATELY: *FEVER GREATER THAN 100.4 F (38 C) OR HIGHER *CHILLS OR SWEATING *NAUSEA AND VOMITING THAT IS NOT CONTROLLED WITH YOUR NAUSEA MEDICATION *UNUSUAL SHORTNESS OF BREATH *UNUSUAL BRUISING OR BLEEDING *URINARY PROBLEMS (pain or burning when urinating, or frequent urination) *BOWEL PROBLEMS (unusual diarrhea, constipation, pain near the anus) TENDERNESS IN MOUTH AND THROAT WITH OR WITHOUT PRESENCE OF ULCERS (sore throat, sores in mouth, or a toothache) UNUSUAL RASH, SWELLING OR PAIN  UNUSUAL VAGINAL DISCHARGE OR ITCHING   Items with * indicate a potential emergency and should be followed up as soon as possible or go to the Emergency Department if any problems should occur.  Please show the CHEMOTHERAPY ALERT CARD or IMMUNOTHERAPY  ALERT CARD at check-in to the Emergency Department and triage nurse.  Should you have questions after your visit or need to cancel or reschedule your appointment, please contact [facility] - A DEPT OF [facility]  [phone] and follow the prompts.  Office hours are 8:00 a.m. to 4:30 p.m. Monday - Friday. Please note that voicemails left after 4:00 p.m. may not be returned until the following business day.  We are closed weekends and major holidays. You have access to a nurse at all times for urgent questions. Please call the main number to the clinic [phone] and follow the prompts.  For any non-urgent questions, you may also contact your provider using MyChart. We now offer e-Visits for anyone 58 and older to request care online for non-urgent symptoms. For details visit [email or web address].   Also download the MyChart app! Go to the app store, search "MyChart", open the app, select [place], and log in with your MyChart username and password.

## 2024-02-20 NOTE — Progress Notes (Signed)
 [facility]  Telephone:([phone]) [phone] Fax:(336) [phone]  ID: Jessica Morrison OB: 02/23/1946  MR#: [id number]  QIO#:[id number]  Patient Care Team: Yehuda Helms, MD as PCP - General (Internal Medicine) Shellie Dials, MD as Consulting Physician (Oncology) Dustin Gimenez, MD as Consulting Physician (Urology)  CHIEF COMPLAINT: Recurrent urothelial carcinoma of the kidney.  INTERVAL HISTORY: Patient returns clinic today for further evaluation and consideration of cycle 3 of Keytruda .  She currently feels well and is asymptomatic.  She does not complain of any weakness or fatigue.  She no longer complains of diarrhea.  She continue to have a poor memory.  She has no neurologic complaints.  She denies any recent fevers or illnesses. She has no chest pain, shortness of breath, cough, or hemoptysis.  She denies any nausea, vomiting, or constipation. She has no urinary complaints.  Patient offers no specific complaints today.  REVIEW OF SYSTEMS:   Review of Systems  Constitutional: Negative.  Negative for fever, malaise/fatigue and weight loss.  Respiratory: Negative.  Negative for cough and shortness of breath.   Cardiovascular: Negative.  Negative for chest pain and leg swelling.  Gastrointestinal: Negative.  Negative for abdominal pain and diarrhea.  Genitourinary: Negative.  Negative for hematuria.  Musculoskeletal: Negative.  Negative for back pain.  Skin: Negative.  Negative for rash.  Neurological: Negative.  Negative for dizziness, focal weakness, weakness and headaches.  Psychiatric/Behavioral:  Positive for memory loss. The patient is not nervous/anxious.     As per HPI. Otherwise, a complete review of systems is negative.  PAST MEDICAL HISTORY: Past Medical History:  Diagnosis Date   B12 deficiency    Back pain    Basal cell carcinoma 08/04/2020   0.5cm above the left mid brow, EDC 09/15/20   Cancer (HCC)    Melanoma   Curvature of spine     Dysplastic nevus 04/30/2013   left post shoulder, left parasternal   Dysplastic nevus 05/04/2014   right prox ant deltoid, left lat buttock   Dysplastic nevus 03/26/2019   right lat neck adjacent to the inf top of MM in situ site scar   Dysplastic nevus 10/29/2019   upper back left paraspinal at level of mid scapula   Dysplastic nevus 03/31/2020, 05/12/20 shave removal   Left mid pretibial. Moderate to severe atypia, deep margin involved.   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   right lat neck inf to right angle of mandible, excised: 07/29/2018, margins free   Memory loss    Mild mitral regurgitation by prior echocardiogram    Nephrolithiasis    Osteoarthritis    Osteopenia    Osteoporosis    Pelvic pain in female    PONV (postoperative nausea and vomiting)    Pre-diabetes    Raynaud disease    Renal cancer, left (HCC) 03/2023   Thyromegaly    Transitional cell carcinoma of kidney, left (HCC)    Urothelial carcinoma of kidney, left (HCC)     PAST SURGICAL HISTORY: Past Surgical History:  Procedure Laterality Date   ABDOMINAL HYSTERECTOMY  BILATERAL OOPHORECTOMY  1999   BLADDER INSTILLATION N/A 04/01/2023   Procedure: BLADDER INSTILLATION OF GEMCITABINE ;  Surgeon: Dustin Gimenez, MD;  Location: [facility];  Service: Urology;  Laterality: N/A;   BLADDER INSTILLATION N/A 07/01/2023   Procedure: BLADDER INSTILLATION OF GEMCITABINE ;  Surgeon: Dustin Gimenez, MD;  Location: [facility];  Service: Urology;  Laterality: N/A;   CATARACT EXTRACTION W/PHACO Right 05/09/2023   Procedure: CATARACT EXTRACTION PHACO AND INTRAOCULAR LENS PLACEMENT (IOC)  RIGHT MALYUGIN OMIDRIA  7.79 00:59.0;  Surgeon: Trudi Fus, MD;  [facility];  Service: Ophthalmology;  Laterality: Right;   CATARACT EXTRACTION W/PHACO Left 05/23/2023   Procedure: CATARACT EXTRACTION PHACO AND INTRAOCULAR LENS PLACEMENT (IOC) LEFT MALYUGIN OMIDRIA  12.13 1:23.6;  Surgeon: Trudi Fus, MD;  Location: [facility];  Service: Ophthalmology;  Laterality: Left;   CESAREAN SECTION     COLONOSCOPY  09/02/2020   COLONOSCOPY WITH PROPOFOL  N/A 05/09/2015   Procedure: COLONOSCOPY WITH PROPOFOL ;  Surgeon: Luella Sager, MD;  Location: [facility];  Service: Endoscopy;  Laterality: N/A;   CYSTOSCOPY W/ RETROGRADES Right 07/01/2023   Procedure: CYSTOSCOPY WITH RETROGRADE PYELOGRAM;  Surgeon: Dustin Gimenez, MD;  Location: [facility];  Service: Urology;  Laterality: Right;   CYSTOSCOPY WITH RETROGRADE PYELOGRAM, URETEROSCOPY AND STENT PLACEMENT Left 10/09/2022   Procedure: CYSTOSCOPY WITH RETROGRADE PYELOGRAM, URETEROSCOPY AND STENT PLACEMENT;  Surgeon: Geraline Knapp, MD;  Location: [facility];  Service: Urology;  Laterality: Left;   IR IMAGING GUIDED PORT INSERTION  12/06/2022   MELANOMA EXCISION Right 07/29/2018   lateral neck inf. to right angle of mandible   OOPHORECTOMY     ROBOT ASSITED LAPAROSCOPIC NEPHROURETERECTOMY Left 04/01/2023   Procedure: XI ROBOT ASSITED LAPAROSCOPIC NEPHROURETERECTOMY;  Surgeon: Dustin Gimenez, MD;  Location: [facility];  Service: Urology;  Laterality: Left;   TRANSURETHRAL RESECTION OF BLADDER TUMOR N/A 07/01/2023   Procedure: TRANSURETHRAL RESECTION OF BLADDER TUMOR (TURBT);  Surgeon: Dustin Gimenez, MD;  Location: [facility];  Service: Urology;  Laterality: N/A;   TRANSURETHRAL RESECTION OF BLADDER TUMOR N/A 12/23/2023   Procedure: TURBT (TRANSURETHRAL RESECTION OF BLADDER TUMOR);  Surgeon: Dustin Gimenez, MD;  Location: [facility];  Service: Urology;  Laterality: N/A;   URETERAL BIOPSY Left 10/09/2022    Procedure: URETERAL BIOPSY;  Surgeon: Geraline Knapp, MD;  Location: [facility];  Service: Urology;  Laterality: Left;    FAMILY HISTORY: Family History  Problem Relation Age of Onset   Emphysema Father    Cancer Neg Hx    Heart disease Neg Hx    Diabetes Neg Hx    Breast cancer Neg Hx     ADVANCED DIRECTIVES (Y/N):  N  HEALTH MAINTENANCE: Social History   Tobacco Use   Smoking status: Never    Passive exposure: Never   Smokeless tobacco: Never  Vaping Use   Vaping status: Never Used  Substance Use Topics   Alcohol use: No   Drug use: No     Colonoscopy:  PAP:  Bone density:  Lipid panel:  Allergies  Allergen Reactions   Codeine Nausea And Vomiting   Neosporin Wound Cleanser [Benzalkonium Chloride] Other (See Comments)    Skin blisters   Penicillins Hives    Current Outpatient Medications  Medication Sig Dispense Refill   acetaminophen  (TYLENOL ) 325 MG tablet Take 650 mg by mouth every 6 (six) hours as needed for moderate pain (pain score 4-6).     calcium -vitamin D (OSCAL WITH D) 500-200 MG-UNIT tablet 1 tablet daily with breakfast.  Cholecalciferol 25 MCG (1000 UT) tablet Take 1,000 Units by mouth daily.     cyanocobalamin  1000 MCG tablet Take 1,000 mcg by mouth daily.     desvenlafaxine (PRISTIQ) 25 MG 24 hr tablet Take 25 mg by mouth daily.     diphenoxylate -atropine  (LOMOTIL ) 2.5-0.025 MG tablet Take 1 tablet by mouth 4 (four) times daily as needed for diarrhea or loose stools. TAKE ONE TABLET BY MOUTH FOUR (FOUR) TIMES DAILY AS NEEDED FOR DIARRHEA OR LOOSE STOOLS. 30 tablet 0   donepezil  (ARICEPT ) 10 MG tablet Take 10 mg by mouth every evening.     memantine  (NAMENDA ) 5 MG tablet Take 5 mg by mouth in the morning and at bedtime.     omeprazole (PRILOSEC) 40 MG capsule 40 mg daily.     oxybutynin  (DITROPAN ) 5 MG tablet Take 1 tablet (5 mg total) by mouth every 8 (eight) hours as needed for bladder spasms. 30 tablet 0   raloxifene  (EVISTA ) 60 MG tablet  TAKE ONE TABLET BY MOUTH ONCE A DAY 90 tablet 2   rosuvastatin  (CRESTOR ) 5 MG tablet Take 5 mg by mouth daily.     vancomycin  (VANCOCIN ) 125 MG capsule Take 1 capsule (125 mg total) by mouth 4 (four) times daily. (Patient not taking: Reported on 02/20/2024) 40 capsule 0   No current facility-administered medications for this visit.    OBJECTIVE: Vitals:   02/20/24 0958  BP: 126/71  Pulse: 74  Resp: 16  Temp: 98.2 F (36.8 C)  SpO2: 100%     Body mass index is 21.19 kg/m.    ECOG FS:0 - Asymptomatic  General: Well-developed, well-nourished, no acute distress. Eyes: Pink conjunctiva, anicteric sclera. HEENT: Normocephalic, moist mucous membranes. Lungs: No audible wheezing or coughing. Heart: Regular rate and rhythm. Abdomen: Soft, nontender, no obvious distention. Musculoskeletal: No edema, cyanosis, or clubbing. Neuro: Alert, answering all questions appropriately. Cranial nerves grossly intact. Skin: No rashes or petechiae noted. Psych: Normal affect.  LAB RESULTS:  Lab Results  Component Value Date   NA 136 01/30/2024   K 4.4 01/30/2024   CL 107 01/30/2024   CO2 21 (L) 01/30/2024   GLUCOSE 102 (H) 01/30/2024   BUN 26 (H) 01/30/2024   CREATININE 1.67 (H) 01/30/2024   CALCIUM  8.6 (L) 01/30/2024   PROT 5.9 (L) 01/30/2024   ALBUMIN  3.1 (L) 01/30/2024   AST 14 (L) 01/30/2024   ALT 9 01/30/2024   ALKPHOS 56 01/30/2024   BILITOT 0.5 01/30/2024   GFRNONAA 31 (L) 01/30/2024    Lab Results  Component Value Date   WBC 7.3 02/20/2024   NEUTROABS 4.5 02/20/2024   HGB 10.0 (L) 02/20/2024   HCT 31.4 (L) 02/20/2024   MCV 86.0 02/20/2024   PLT 184 02/20/2024     STUDIES: CT ABDOMEN PELVIS WO CONTRAST Result Date: 02/03/2024 CLINICAL DATA:  Diarrhea discomfort abdominal pain. History of urothelial carcinoma EXAM: CT ABDOMEN AND PELVIS WITHOUT CONTRAST TECHNIQUE: Multidetector CT imaging of the abdomen and pelvis was performed following the standard protocol without IV  contrast. RADIATION DOSE REDUCTION: This exam was performed according to the departmental dose-optimization program which includes automated exposure control, adjustment of the mA and/or kV according to patient size and/or use of iterative reconstruction technique. COMPARISON:  CT abdomen and pelvis November 11, 2023 FINDINGS: Lower chest: No infiltrates or consolidations, no pleural effusions Hepatobiliary: Liver normal size no masses no biliary dilatation. Gallbladder unremarkable. No gallstones. Pancreas: Pancreas normal size. No masses calcifications or inflammatory changes. Spleen: Normal in  size without focal abnormality., accessory spleen 1 cm. Unchanged since prior. Adrenals/Urinary Tract: Normal adrenal glands, normal right kidney Prior left nephrectomy. No calcifications no hydronephrosis. Bladder appears unremarkable. Kidneys are normal. No masses calcifications or hydronephrosis Stomach/Bowel: No small or large bowel obstruction or inflammatory changes. Moderate amount of residual fecal material throughout the colon without obstruction or constipation. Vascular/Lymphatic: Aortic atherosclerosis. No enlarged abdominal or pelvic lymph nodes. Reproductive: Status post hysterectomy. No adnexal masses. Multilevel degenerative disc Other: Anterior abdominal wall unremarkable without evidence of umbilical or inguinal hernias Musculoskeletal: Visualized portion of the thoracolumbar spine and pelvic structures grossly unremarkable without evidence of fracture bony abnormalities or soft tissue masses. Multilevel degenerative disc disease without fractures. IMPRESSION: *No acute intra-abdominal or pelvic pathology. *Prior left nephrectomy. *Moderate amount of residual fecal material throughout the colon without obstruction or constipation. *Aortic atherosclerosis. 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 Electronically Signed   By: Fredrich Jefferson M.D.   On: 02/03/2024 12:42     ASSESSMENT:  Recurrent urothelial carcinoma of the kidney.  PLAN:    Recurrent urothelial carcinoma of the kidney:  Patient completed 4 cycles of neoadjuvant cisplatin  and gemcitabine  on Feb 19, 2023.  She underwent complete R0 surgical resection on April 01, 2023.  Patient also recently underwent BCG treatments for noninvasive lesions in her bladder.  Repeat cystoscopy on December 23, 2023 revealed a recurrence of a high-grade invasive urothelial carcinoma.  After lengthy discussion with the patient and her daughter, is agreed upon that surgery and chemotherapy were no longer options for treatment.  She has agreed to single agent Keytruda  every 3 weeks.  Hospice and comfort care were previously discussed, but patient would like to try treatment first.  She expressed understanding that there is likely no further treatment options after the immunotherapy.  Proceed with cycle 3 of Keytruda  today.  Return to clinic in 3 weeks for further evaluation and consideration of cycle 4.  Patient has been instructed to keep her follow-up appointment with urology in June 2025. Memory loss: Chronic and unchanged.  Continue follow-up with neurology as indicated.   Renal insufficiency: Chronic and unchanged.  Patient's GFR is 33 today.  Keytruda  does not need to be dose reduced due to renal insufficiency. Diarrhea: Resolved.  Unrelated to Keytruda  as symptoms were evident prior to initiating treatment.  Previously, patient found to be colonized with C. difficile and treated with oral vancomycin .  Continue Lomotil  as prescribed and patient was previously given a referral to GI for further evaluation. Leukocytosis: Resolved. Anemia: Hemoglobin mildly improved to 10.0.     Patient expressed understanding and was in agreement with this plan. She also understands that She can call clinic at any time with any questions, concerns, or complaints.    Cancer Staging  Urothelial carcinoma of kidney [facility]) Staging form: Kidney, AJCC 8th Edition -  Clinical stage from 12/01/2022: Stage Unknown (cTX, cN0, cM0) - Signed by Shellie Dials, MD on 12/01/2022 Stage prefix: Initial diagnosis Histologic grade (G): G3 Histologic grading system: 4 grade system   Shellie Dials, MD   02/20/2024 10:12 AM

## 2024-02-21 LAB — T4: T4, Total: 8.7 ug/dL (ref 4.5–12.0)

## 2024-02-26 ENCOUNTER — Ambulatory Visit: Payer: PPO | Admitting: Dermatology

## 2024-02-28 DIAGNOSIS — G3184 Mild cognitive impairment, so stated: Secondary | ICD-10-CM | POA: Diagnosis not present

## 2024-02-28 DIAGNOSIS — C689 Malignant neoplasm of urinary organ, unspecified: Secondary | ICD-10-CM | POA: Diagnosis not present

## 2024-02-28 DIAGNOSIS — Z87898 Personal history of other specified conditions: Secondary | ICD-10-CM | POA: Diagnosis not present

## 2024-03-12 ENCOUNTER — Encounter: Payer: Self-pay | Admitting: Oncology

## 2024-03-12 ENCOUNTER — Inpatient Hospital Stay: Admitting: Oncology

## 2024-03-12 ENCOUNTER — Inpatient Hospital Stay

## 2024-03-12 VITALS — BP 137/78 | HR 71 | Temp 98.9°F | Resp 16 | Ht <= 58 in | Wt 99.7 lb

## 2024-03-12 DIAGNOSIS — C642 Malignant neoplasm of left kidney, except renal pelvis: Secondary | ICD-10-CM

## 2024-03-12 DIAGNOSIS — Z5112 Encounter for antineoplastic immunotherapy: Secondary | ICD-10-CM | POA: Diagnosis not present

## 2024-03-12 LAB — CBC WITH DIFFERENTIAL (CANCER CENTER ONLY)
Abs Immature Granulocytes: 0.03 10*3/uL (ref 0.00–0.07)
Basophils Absolute: 0.1 10*3/uL (ref 0.0–0.1)
Basophils Relative: 1 %
Eosinophils Absolute: 0.2 10*3/uL (ref 0.0–0.5)
Eosinophils Relative: 2 %
HCT: 33.6 % — ABNORMAL LOW (ref 36.0–46.0)
Hemoglobin: 10.7 g/dL — ABNORMAL LOW (ref 12.0–15.0)
Immature Granulocytes: 0 %
Lymphocytes Relative: 24 %
Lymphs Abs: 2 10*3/uL (ref 0.7–4.0)
MCH: 27.2 pg (ref 26.0–34.0)
MCHC: 31.8 g/dL (ref 30.0–36.0)
MCV: 85.5 fL (ref 80.0–100.0)
Monocytes Absolute: 0.7 10*3/uL (ref 0.1–1.0)
Monocytes Relative: 9 %
Neutro Abs: 5.3 10*3/uL (ref 1.7–7.7)
Neutrophils Relative %: 64 %
Platelet Count: 182 10*3/uL (ref 150–400)
RBC: 3.93 MIL/uL (ref 3.87–5.11)
RDW: 13.9 % (ref 11.5–15.5)
WBC Count: 8.3 10*3/uL (ref 4.0–10.5)
nRBC: 0 % (ref 0.0–0.2)

## 2024-03-12 LAB — CMP (CANCER CENTER ONLY)
ALT: 13 U/L (ref 0–44)
AST: 17 U/L (ref 15–41)
Albumin: 3.9 g/dL (ref 3.5–5.0)
Alkaline Phosphatase: 58 U/L (ref 38–126)
Anion gap: 9 (ref 5–15)
BUN: 29 mg/dL — ABNORMAL HIGH (ref 8–23)
CO2: 21 mmol/L — ABNORMAL LOW (ref 22–32)
Calcium: 8.7 mg/dL — ABNORMAL LOW (ref 8.9–10.3)
Chloride: 107 mmol/L (ref 98–111)
Creatinine: 1.58 mg/dL — ABNORMAL HIGH (ref 0.44–1.00)
GFR, Estimated: 33 mL/min — ABNORMAL LOW (ref >60)
Glucose, Bld: 96 mg/dL (ref 70–99)
Potassium: 5.1 mmol/L (ref 3.5–5.1)
Sodium: 137 mmol/L (ref 135–145)
Total Bilirubin: 0.7 mg/dL (ref 0.0–1.2)
Total Protein: 6.6 g/dL (ref 6.5–8.1)

## 2024-03-12 MED ORDER — SODIUM CHLORIDE 0.9 % IV SOLN
200.0000 mg | Freq: Once | INTRAVENOUS | Status: AC
  Administered 2024-03-12: 200 mg via INTRAVENOUS
  Filled 2024-03-12: qty 8

## 2024-03-12 MED ORDER — SODIUM CHLORIDE 0.9 % IV SOLN
INTRAVENOUS | Status: DC
  Filled 2024-03-12: qty 250

## 2024-03-12 MED ORDER — HEPARIN SOD (PORK) LOCK FLUSH 100 UNIT/ML IV SOLN
500.0000 [IU] | Freq: Once | INTRAVENOUS | Status: AC | PRN
  Administered 2024-03-12: 500 [IU]
  Filled 2024-03-12: qty 5

## 2024-03-12 NOTE — Progress Notes (Signed)
  [facility]  [phone] [phone]  ID: Jessica Morrison OB: 02/18/1946  MR#: [id number]  WNU#:[id number]  Patient Care Team: Yehuda Helms, MD as PCP - General (Internal Medicine) Shellie Dials, MD as Consulting Physician (Oncology) Dustin Gimenez, MD as Consulting Physician (Urology)  CHIEF COMPLAINT: Recurrent urothelial carcinoma of the kidney.  INTERVAL HISTORY: Patient returns to clinic today for further evaluation and consideration of cycle 4 of Keytruda .  She continues to tolerate her treatments well without significant side effects.  She currently feels well and is asymptomatic.  She does not complain of any weakness or fatigue.  She no longer complains of diarrhea.  She continue to have a poor memory.  She has no neurologic complaints.  She denies any recent fevers or illnesses. She has no chest pain, shortness of breath, cough, or hemoptysis.  She denies any nausea, vomiting, or constipation. She has no urinary complaints.  Patient offers no specific complaints today.  REVIEW OF SYSTEMS:   Review of Systems  Constitutional: Negative.  Negative for fever, malaise/fatigue and weight loss.  Respiratory: Negative.  Negative for cough and shortness of breath.   Cardiovascular: Negative.  Negative for chest pain and leg swelling.  Gastrointestinal: Negative.  Negative for abdominal pain and diarrhea.  Genitourinary: Negative.  Negative for hematuria.  Musculoskeletal: Negative.  Negative for back pain.  Skin: Negative.  Negative for rash.  Neurological: Negative.  Negative for dizziness, focal weakness, weakness and headaches.  Psychiatric/Behavioral:  Positive for memory loss. The patient is not nervous/anxious.     As per HPI. Otherwise, a complete review of systems is negative.  PAST MEDICAL HISTORY: Past Medical History:  Diagnosis Date   B12 deficiency    Back pain    Basal cell carcinoma 08/04/2020   0.5cm above the  left mid brow, EDC 09/15/20   Cancer (HCC)    Melanoma   Curvature of spine    Dysplastic nevus 04/30/2013   left post shoulder, left parasternal   Dysplastic nevus 05/04/2014   right prox ant deltoid, left lat buttock   Dysplastic nevus 03/26/2019   right lat neck adjacent to the inf top of MM in situ site scar   Dysplastic nevus 10/29/2019   upper back left paraspinal at level of mid scapula   Dysplastic nevus 03/31/2020, 05/12/20 shave removal   Left mid pretibial. Moderate to severe atypia, deep margin involved.   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   right lat neck inf to right angle of mandible, excised: 07/29/2018, margins free   Memory loss    Mild mitral regurgitation by prior echocardiogram    Nephrolithiasis    Osteoarthritis    Osteopenia    Osteoporosis    Pelvic pain in female    PONV (postoperative nausea and vomiting)    Pre-diabetes    Raynaud disease    Renal cancer, left (HCC) 03/2023   Thyromegaly    Transitional cell carcinoma of kidney, left (HCC)    Urothelial carcinoma of kidney, left (HCC)     PAST SURGICAL  HISTORY: Past Surgical History:  Procedure Laterality Date   ABDOMINAL HYSTERECTOMY     BILATERAL OOPHORECTOMY  1999   BLADDER INSTILLATION N/A 04/01/2023   Procedure: BLADDER INSTILLATION OF GEMCITABINE ;  Surgeon: Dustin Gimenez, MD;  Location: [facility];  Service: Urology;  Laterality: N/A;   BLADDER INSTILLATION N/A 07/01/2023   Procedure: BLADDER INSTILLATION OF GEMCITABINE ;  Surgeon: Dustin Gimenez, MD;  Location: [facility];  Service: Urology;  Laterality: N/A;   CATARACT EXTRACTION W/PHACO Right  05/09/2023   Procedure: CATARACT EXTRACTION PHACO AND INTRAOCULAR LENS PLACEMENT (IOC) RIGHT MALYUGIN OMIDRIA  7.79 00:59.0;  Surgeon: Trudi Fus, MD;  Location: [facility];  Service: Ophthalmology;  Laterality: Right;   CATARACT EXTRACTION W/PHACO Left 05/23/2023   Procedure: CATARACT EXTRACTION PHACO AND INTRAOCULAR LENS PLACEMENT (IOC) LEFT MALYUGIN OMIDRIA  12.13 1:23.6;  Surgeon: Trudi Fus, MD;  Location: [facility];  Service: Ophthalmology;  Laterality: Left;   CESAREAN SECTION     COLONOSCOPY  09/02/2020   COLONOSCOPY WITH PROPOFOL  N/A 05/09/2015   Procedure: COLONOSCOPY WITH PROPOFOL ;  Surgeon: Luella Sager, MD;  Location: [facility];  Service: Endoscopy;  Laterality: N/A;   CYSTOSCOPY W/ RETROGRADES Right 07/01/2023   Procedure: CYSTOSCOPY WITH RETROGRADE PYELOGRAM;  Surgeon: Dustin Gimenez, MD;  Location: [facility];  Service: Urology;  Laterality: Right;   CYSTOSCOPY WITH RETROGRADE PYELOGRAM, URETEROSCOPY AND STENT PLACEMENT Left 10/09/2022   Procedure: CYSTOSCOPY WITH RETROGRADE PYELOGRAM, URETEROSCOPY AND STENT PLACEMENT;  Surgeon: Geraline Knapp, MD;  Location: [facility];  Service: Urology;  Laterality: Left;   IR IMAGING GUIDED PORT INSERTION  12/06/2022   MELANOMA EXCISION Right 07/29/2018   lateral neck inf. to right angle of mandible   OOPHORECTOMY     ROBOT ASSITED LAPAROSCOPIC NEPHROURETERECTOMY Left 04/01/2023   Procedure: XI ROBOT ASSITED LAPAROSCOPIC NEPHROURETERECTOMY;  Surgeon: Dustin Gimenez, MD;  Location: [facility];  Service: Urology;  Laterality: Left;   TRANSURETHRAL RESECTION OF BLADDER TUMOR N/A 07/01/2023   Procedure: TRANSURETHRAL RESECTION OF BLADDER TUMOR (TURBT);  Surgeon: Dustin Gimenez, MD;  Location: [facility];  Service: Urology;  Laterality: N/A;   TRANSURETHRAL RESECTION OF BLADDER TUMOR N/A 12/23/2023   Procedure: TURBT (TRANSURETHRAL RESECTION OF BLADDER TUMOR);  Surgeon: Dustin Gimenez, MD;  Location:  [facility];  Service: Urology;  Laterality: N/A;   URETERAL BIOPSY Left 10/09/2022   Procedure: URETERAL BIOPSY;  Surgeon: Geraline Knapp, MD;  Location: [facility];  Service: Urology;  Laterality: Left;    FAMILY HISTORY: Family History  Problem Relation Age of Onset   Emphysema Father    Cancer Neg Hx    Heart disease Neg Hx    Diabetes Neg Hx    Breast cancer Neg Hx     ADVANCED DIRECTIVES (Y/N):  N  HEALTH MAINTENANCE: Social History   Tobacco Use   Smoking status: Never    Passive exposure: Never   Smokeless tobacco: Never  Vaping Use   Vaping status: Never Used  Substance Use Topics   Alcohol use: No   Drug use: No     Colonoscopy:  PAP:  Bone density:  Lipid panel:  Allergies  Allergen Reactions   Codeine Nausea And Vomiting   Neosporin Wound Cleanser [Benzalkonium Chloride] Other (See Comments)    Skin blisters   Penicillins Hives    Current Outpatient Medications  Medication Sig Dispense Refill   acetaminophen  (TYLENOL ) 325 MG tablet Take 650 mg by mouth every 6 (six) hours as needed for moderate pain (pain score 4-6).  calcium -vitamin D (OSCAL WITH D) 500-200 MG-UNIT tablet 1 tablet daily with breakfast.     Cholecalciferol 25 MCG (1000 UT) tablet Take 1,000 Units by mouth daily.     cyanocobalamin  1000 MCG tablet Take 1,000 mcg by mouth daily.     desvenlafaxine (PRISTIQ) 25 MG 24 hr tablet Take 25 mg by mouth daily.     diphenoxylate -atropine  (LOMOTIL ) 2.5-0.025 MG tablet Take 1 tablet by mouth 4 (four) times daily as needed for diarrhea or loose stools. TAKE ONE TABLET BY MOUTH FOUR (FOUR) TIMES DAILY AS NEEDED FOR DIARRHEA OR LOOSE STOOLS. 30 tablet 0   donepezil  (ARICEPT ) 10 MG tablet Take 10 mg by mouth every evening.     memantine  (NAMENDA ) 5 MG tablet Take 5 mg by mouth in the morning and at bedtime.     omeprazole (PRILOSEC) 40 MG capsule 40 mg daily.     oxybutynin  (DITROPAN ) 5 MG tablet Take 1 tablet (5 mg total) by mouth every 8 (eight)  hours as needed for bladder spasms. 30 tablet 0   raloxifene  (EVISTA ) 60 MG tablet TAKE ONE TABLET BY MOUTH ONCE A DAY 90 tablet 2   rosuvastatin  (CRESTOR ) 5 MG tablet Take 5 mg by mouth daily.     saccharomyces boulardii (FLORASTOR) 250 MG capsule Take 250 mg by mouth 2 (two) times daily.     vancomycin  (VANCOCIN ) 125 MG capsule Take 1 capsule (125 mg total) by mouth 4 (four) times daily. (Patient not taking: Reported on 03/12/2024) 40 capsule 0   No current facility-administered medications for this visit.    OBJECTIVE: Vitals:   03/12/24 0943  BP: 137/78  Pulse: 71  Resp: 16  Temp: 98.9 F (37.2 C)  SpO2: 100%     Body mass index is 21.57 kg/m.    ECOG FS:0 - Asymptomatic  General: Well-developed, well-nourished, no acute distress. Eyes: Pink conjunctiva, anicteric sclera. HEENT: Normocephalic, moist mucous membranes. Lungs: No audible wheezing or coughing. Heart: Regular rate and rhythm. Abdomen: Soft, nontender, no obvious distention. Musculoskeletal: No edema, cyanosis, or clubbing. Neuro: Alert, answering all questions appropriately. Cranial nerves grossly intact. Skin: No rashes or petechiae noted. Psych: Normal affect.  LAB RESULTS:  Lab Results  Component Value Date   NA 139 02/20/2024   K 4.4 02/20/2024   CL 108 02/20/2024   CO2 20 (L) 02/20/2024   GLUCOSE 94 02/20/2024   BUN 32 (H) 02/20/2024   CREATININE 1.61 (H) 02/20/2024   CALCIUM  8.8 (L) 02/20/2024   PROT 6.6 02/20/2024   ALBUMIN  3.8 02/20/2024   AST 16 02/20/2024   ALT 9 02/20/2024   ALKPHOS 50 02/20/2024   BILITOT 0.7 02/20/2024   GFRNONAA 33 (L) 02/20/2024    Lab Results  Component Value Date   WBC 8.3 03/12/2024   NEUTROABS 5.3 03/12/2024   HGB 10.7 (L) 03/12/2024   HCT 33.6 (L) 03/12/2024   MCV 85.5 03/12/2024   PLT 182 03/12/2024     STUDIES: No results found.    ASSESSMENT: Recurrent urothelial carcinoma of the kidney.  PLAN:    Recurrent urothelial carcinoma of the  kidney:  Patient completed 4 cycles of neoadjuvant cisplatin  and gemcitabine  on Feb 19, 2023.  She underwent complete R0 surgical resection on April 01, 2023.  Patient also recently underwent BCG treatments for noninvasive lesions in her bladder.  Repeat cystoscopy on December 23, 2023 revealed a recurrence of a high-grade invasive urothelial carcinoma.  After lengthy discussion with the patient and her daughter, is agreed upon  that surgery and chemotherapy were no longer options for treatment.  She has agreed to single agent Keytruda  every 3 weeks.  Hospice and comfort care were previously discussed, but patient would like to try treatment first.  She expressed understanding that there is likely no further treatment options after the immunotherapy.  Proceed with cycle 4 of Keytruda  today.  Return to clinic in 3 weeks for further evaluation and consideration of cycle 5.  Patient has an appointment and cystoscopy scheduled with urology on April 08, 2024.  Memory loss: Chronic and unchanged.  Continue follow-up with neurology as indicated.   Renal insufficiency: Chronic and unchanged.  Patient's GFR is 33 today.  Keytruda  does not need to be dose reduced due to renal insufficiency. Diarrhea: Resolved.  Unrelated to Keytruda  as symptoms were evident prior to initiating treatment.  Previously, patient found to be colonized with C. difficile and treated with oral vancomycin .  Continue Lomotil  as prescribed and patient was previously given a referral to GI for further evaluation. Anemia: Hemoglobin continues to trend up and is now 10.7.  I spent a total of 30 minutes reviewing chart data, face-to-face evaluation with the patient, counseling and coordination of care as detailed above.   Patient expressed understanding and was in agreement with this plan. She also understands that She can call clinic at any time with any questions, concerns, or complaints.    Cancer Staging  Urothelial carcinoma of kidney  [facility]) Staging form: Kidney, AJCC 8th Edition - Clinical stage from 12/01/2022: Stage Unknown (cTX, cN0, cM0) - Signed by Shellie Dials, MD on 12/01/2022 Stage prefix: Initial diagnosis Histologic grade (G): G3 Histologic grading system: 4 grade system   Shellie Dials, MD   03/12/2024 10:00 AM

## 2024-03-12 NOTE — Patient Instructions (Signed)
 [facility] - A DEPT OF [facility]  Discharge Instructions: Thank you for choosing [facility] to provide your oncology and hematology care.  If you have a lab appointment with the Cancer Center, please go directly to the Cancer Center and check in at the registration area.  Wear comfortable clothing and clothing appropriate for easy access to any Portacath or PICC line.   We strive to give you quality time with your provider. You may need to reschedule your appointment if you arrive late (15 or more minutes).  Arriving late affects you and other patients whose appointments are after yours.  Also, if you miss three or more appointments without notifying the office, you may be dismissed from the clinic at the provider's discretion.      For prescription refill requests, have your pharmacy contact our office and allow 72 hours for refills to be completed.    Today you received the following chemotherapy and/or immunotherapy agents Jessica Morrison      To help prevent nausea and vomiting after your treatment, we encourage you to take your nausea medication as directed.  BELOW ARE SYMPTOMS THAT SHOULD BE REPORTED IMMEDIATELY: *FEVER GREATER THAN 100.4 F (38 C) OR HIGHER *CHILLS OR SWEATING *NAUSEA AND VOMITING THAT IS NOT CONTROLLED WITH YOUR NAUSEA MEDICATION *UNUSUAL SHORTNESS OF BREATH *UNUSUAL BRUISING OR BLEEDING *URINARY PROBLEMS (pain or burning when urinating, or frequent urination) *BOWEL PROBLEMS (unusual diarrhea, constipation, pain near the anus) TENDERNESS IN MOUTH AND THROAT WITH OR WITHOUT PRESENCE OF ULCERS (sore throat, sores in mouth, or a toothache) UNUSUAL RASH, SWELLING OR PAIN  UNUSUAL VAGINAL DISCHARGE OR ITCHING   Items with * indicate a potential emergency and should be followed up as soon as possible or go to the Emergency Department if any problems should occur.  Please show the CHEMOTHERAPY ALERT CARD or IMMUNOTHERAPY  ALERT CARD at check-in to the Emergency Department and triage nurse.  Should you have questions after your visit or need to cancel or reschedule your appointment, please contact [facility] - A DEPT OF [facility]  [phone] and follow the prompts.  Office hours are 8:00 a.m. to 4:30 p.m. Monday - Friday. Please note that voicemails left after 4:00 p.m. may not be returned until the following business day.  We are closed weekends and major holidays. You have access to a nurse at all times for urgent questions. Please call the main number to the clinic [phone] and follow the prompts.  For any non-urgent questions, you may also contact your provider using MyChart. We now offer e-Visits for anyone 48 and older to request care online for non-urgent symptoms. For details visit [email or web address].   Also download the MyChart app! Go to the app store, search "MyChart", open the app, select [place], and log in with your MyChart username and password.

## 2024-03-26 ENCOUNTER — Other Ambulatory Visit: Payer: Self-pay | Admitting: Oncology

## 2024-03-26 NOTE — Progress Notes (Signed)
 Patient declined services on 03/26/2024.  The referral has been closed.  [facility] remains available should the patient express interest in the future.

## 2024-03-30 ENCOUNTER — Encounter: Payer: Self-pay | Admitting: Oncology

## 2024-03-30 ENCOUNTER — Encounter: Payer: Self-pay | Admitting: Dermatology

## 2024-03-30 ENCOUNTER — Ambulatory Visit: Admitting: Dermatology

## 2024-03-30 DIAGNOSIS — Z85820 Personal history of malignant melanoma of skin: Secondary | ICD-10-CM | POA: Diagnosis not present

## 2024-03-30 DIAGNOSIS — Z86018 Personal history of other benign neoplasm: Secondary | ICD-10-CM

## 2024-03-30 DIAGNOSIS — D1801 Hemangioma of skin and subcutaneous tissue: Secondary | ICD-10-CM

## 2024-03-30 DIAGNOSIS — B351 Tinea unguium: Secondary | ICD-10-CM | POA: Diagnosis not present

## 2024-03-30 DIAGNOSIS — L570 Actinic keratosis: Secondary | ICD-10-CM | POA: Diagnosis not present

## 2024-03-30 DIAGNOSIS — L578 Other skin changes due to chronic exposure to nonionizing radiation: Secondary | ICD-10-CM

## 2024-03-30 DIAGNOSIS — Z85828 Personal history of other malignant neoplasm of skin: Secondary | ICD-10-CM | POA: Diagnosis not present

## 2024-03-30 DIAGNOSIS — L814 Other melanin hyperpigmentation: Secondary | ICD-10-CM

## 2024-03-30 DIAGNOSIS — L821 Other seborrheic keratosis: Secondary | ICD-10-CM | POA: Diagnosis not present

## 2024-03-30 DIAGNOSIS — Z1283 Encounter for screening for malignant neoplasm of skin: Secondary | ICD-10-CM | POA: Diagnosis not present

## 2024-03-30 DIAGNOSIS — B353 Tinea pedis: Secondary | ICD-10-CM | POA: Diagnosis not present

## 2024-03-30 DIAGNOSIS — Z79899 Other long term (current) drug therapy: Secondary | ICD-10-CM

## 2024-03-30 DIAGNOSIS — L820 Inflamed seborrheic keratosis: Secondary | ICD-10-CM | POA: Diagnosis not present

## 2024-03-30 DIAGNOSIS — Z7189 Other specified counseling: Secondary | ICD-10-CM

## 2024-03-30 DIAGNOSIS — W908XXA Exposure to other nonionizing radiation, initial encounter: Secondary | ICD-10-CM | POA: Diagnosis not present

## 2024-03-30 MED ORDER — KETOCONAZOLE 2 % EX CREA: TOPICAL_CREAM | CUTANEOUS | 11 refills | Status: DC

## 2024-03-30 MED ORDER — JUBLIA 10 % EX SOLN: CUTANEOUS | 11 refills | Status: DC

## 2024-03-30 NOTE — Progress Notes (Signed)
 Follow-Up Visit   Subjective  Jessica Morrison is a 78 y.o. female who presents for the following: Skin Cancer Screening and Full Body Skin Exam. Hx of MIS, HxDN, HxBCC. Areas of concern on nose.   The patient presents for Total-Body Skin Exam (TBSE) for skin cancer screening and mole check. The patient has spots, moles and lesions to be evaluated, some may be new or changing and the patient may have concern these could be cancer.  The following portions of the chart were reviewed this encounter and updated as appropriate: medications, allergies, medical history  Review of Systems:  No other skin or systemic complaints except as noted in HPI or Assessment and Plan.  Objective  Well appearing patient in no apparent distress; mood and affect are within normal limits.  A full examination was performed including scalp, head, eyes, ears, nose, lips, neck, chest, axillae, abdomen, back, buttocks, bilateral upper extremities, bilateral lower extremities, hands, feet, fingers, toes, fingernails, and toenails. All findings within normal limits unless otherwise noted below.   Relevant physical exam findings are noted in the Assessment and Plan.  R Nose x1 Erythematous thin papules/macules with gritty scale.  Right Temple x2 (2) Erythematous keratotic or waxy stuck-on papule or plaque.  Assessment & Plan   SKIN CANCER SCREENING PERFORMED TODAY.  HISTORY OF MELANOMA IN SITU. Right lat neck inf to right angle of mandible, excised: 07/29/2018, margins free  - No evidence of recurrence today - No lymphadenopathy  - Recommend regular full body skin exams - Recommend daily broad spectrum sunscreen SPF 30+ to sun-exposed areas, reapply every 2 hours as needed.  - Call if any new or changing lesions are noted between office visits    HISTORY OF BASAL CELL CARCINOMA OF THE SKIN - No evidence of recurrence today - Recommend regular full body skin exams - Recommend daily broad spectrum sunscreen  SPF 30+ to sun-exposed areas, reapply every 2 hours as needed.  - Call if any new or changing lesions are noted between office visits    HISTORY OF DYSPLASTIC NEVI No evidence of recurrence today Recommend regular full body skin exams Recommend daily broad spectrum sunscreen SPF 30+ to sun-exposed areas, reapply every 2 hours as needed.  Call if any new or changing lesions are noted between office visits    ACTINIC DAMAGE - Chronic condition, secondary to cumulative UV/sun exposure - diffuse scaly erythematous macules with underlying dyspigmentation - Recommend daily broad spectrum sunscreen SPF 30+ to sun-exposed areas, reapply every 2 hours as needed.  - Staying in the shade or wearing long sleeves, sun glasses (UVA+UVB protection) and wide brim hats (4-inch brim around the entire circumference of the hat) are also recommended for sun protection.  - Call for new or changing lesions.  LENTIGINES, SEBORRHEIC KERATOSES, HEMANGIOMAS - Benign normal skin lesions - Benign-appearing - Call for any changes  MELANOCYTIC NEVI - Tan-brown and/or pink-flesh-colored symmetric macules and papules - Benign appearing on exam today - Observation - Call clinic for new or changing moles - Recommend daily use of broad spectrum spf 30+ sunscreen to sun-exposed areas.   Hx of renal cancer, Hx of bladder cancer.  TINEA PEDIS with tinea unguium Exam: Scaling and maceration web spaces and over distal and lateral soles. Thickened discolored toenails.  Chronic and persistent condition with duration or expected duration over one year. Condition is symptomatic / bothersome to patient. Not to goal. Treatment Plan: Start Ketoconazole cream twice a day to feet until clear.  Start  Jublia solution to affected toenails every night at bedtime.     AK (ACTINIC KERATOSIS) R Nose x1 Actinic keratoses are precancerous spots that appear secondary to cumulative UV radiation exposure/sun exposure over time. They are  chronic with expected duration over 1 year. A portion of actinic keratoses will progress to squamous cell carcinoma of the skin. It is not possible to reliably predict which spots will progress to skin cancer and so treatment is recommended to prevent development of skin cancer.  Recommend daily broad spectrum sunscreen SPF 30+ to sun-exposed areas, reapply every 2 hours as needed.  Recommend staying in the shade or wearing long sleeves, sun glasses (UVA+UVB protection) and wide brim hats (4-inch brim around the entire circumference of the hat). Call for new or changing lesions. Destruction of lesion - R Nose x1 Complexity: simple   Destruction method: cryotherapy   Informed consent: discussed and consent obtained   Timeout:  patient name, date of birth, surgical site, and procedure verified Lesion destroyed using liquid nitrogen: Yes   Region frozen until ice ball extended beyond lesion: Yes   Outcome: patient tolerated procedure well with no complications   Post-procedure details: wound care instructions given   INFLAMED SEBORRHEIC KERATOSIS (2) Right Temple x2 (2) Symptomatic, irritating, patient would like treated. Destruction of lesion - Right Temple x2 (2) Complexity: simple   Destruction method: cryotherapy   Informed consent: discussed and consent obtained   Timeout:  patient name, date of birth, surgical site, and procedure verified Lesion destroyed using liquid nitrogen: Yes   Region frozen until ice ball extended beyond lesion: Yes   Outcome: patient tolerated procedure well with no complications   Post-procedure details: wound care instructions given   Additional details:  Prior to procedure, discussed risks of blister formation, small wound, skin dyspigmentation, or rare scar following cryotherapy. Recommend Vaseline ointment to treated areas while healing.   Return in about 1 year (around 03/30/2025) for TBSE, Hx of MIS, HxDN, HxBCC.  I, Jill Parcell, CMA, am acting as  scribe for Celine Collard, MD.   Documentation: I have reviewed the above documentation for accuracy and completeness, and I agree with the above.  Celine Collard, MD

## 2024-03-30 NOTE — Patient Instructions (Addendum)
 Start Ketoconazole cream twice a day to feet until clear.  Start Jublia solution to affected toenails every night at bedtime.      Cryotherapy Aftercare  Wash gently with soap and water  everyday.   Apply Vaseline Jelly daily until healed.   Recommend daily broad spectrum sunscreen SPF 30+ to sun-exposed areas, reapply every 2 hours as needed. Call for new or changing lesions.  Staying in the shade or wearing long sleeves, sun glasses (UVA+UVB protection) and wide brim hats (4-inch brim around the entire circumference of the hat) are also recommended for sun protection.      Melanoma ABCDEs  Melanoma is the most dangerous type of skin cancer, and is the leading cause of death from skin disease.  You are more likely to develop melanoma if you: Have light-colored skin, light-colored eyes, or red or blond hair Spend a lot of time in the sun Tan regularly, either outdoors or in a tanning bed Have had blistering sunburns, especially during childhood Have a close family member who has had a melanoma Have atypical moles or large birthmarks  Early detection of melanoma is key since treatment is typically straightforward and cure rates are extremely high if we catch it early.   The first sign of melanoma is often a change in a mole or a new dark spot.  The ABCDE system is a way of remembering the signs of melanoma.  A for asymmetry:  The two halves do not match. B for border:  The edges of the growth are irregular. C for color:  A mixture of colors are present instead of an even brown color. D for diameter:  Melanomas are usually (but not always) greater than 6mm - the size of a pencil eraser. E for evolution:  The spot keeps changing in size, shape, and color.  Please check your skin once per month between visits. You can use a small mirror in front and a large mirror behind you to keep an eye on the back side or your body.   If you see any new or changing lesions before your next  follow-up, please call to schedule a visit.  Please continue daily skin protection including broad spectrum sunscreen SPF 30+ to sun-exposed areas, reapplying every 2 hours as needed when you're outdoors.   Staying in the shade or wearing long sleeves, sun glasses (UVA+UVB protection) and wide brim hats (4-inch brim around the entire circumference of the hat) are also recommended for sun protection.      Due to recent changes in healthcare laws, you may see results of your pathology and/or laboratory studies on MyChart before the doctors have had a chance to review them. We understand that in some cases there may be results that are confusing or concerning to you. Please understand that not all results are received at the same time and often the doctors may need to interpret multiple results in order to provide you with the best plan of care or course of treatment. Therefore, we ask that you please give us  2 business days to thoroughly review all your results before contacting the office for clarification. Should we see a critical lab result, you will be contacted sooner.   If You Need Anything After Your Visit  If you have any questions or concerns for your doctor, please call our main line at [phone] and press option 4 to reach your doctor's medical assistant. If no one answers, please leave a voicemail as directed and we will return  your call as soon as possible. Messages left after 4 pm will be answered the following business day.   You may also send us  a message via MyChart. We typically respond to MyChart messages within 1-2 business days.  For prescription refills, please ask your pharmacy to contact our office. Our fax number is [phone].  If you have an urgent issue when the clinic is closed that cannot wait until the next business day, you can page your doctor at the number below.    Please note that while we do our best to be available for urgent issues outside of office  hours, we are not available 24/7.   If you have an urgent issue and are unable to reach us , you may choose to seek medical care at your doctor's office, retail clinic, urgent care center, or emergency room.  If you have a medical emergency, please immediately call 911 or go to the emergency department.  Pager Numbers  - Dr. Bary Likes: [phone]  - Dr. Annette Barters: [phone]  - Dr. Felipe Horton: [phone]   In the event of inclement weather, please call our main line at [phone] for an update on the status of any delays or closures.  Dermatology Medication Tips: Please keep the boxes that topical medications come in in order to help keep track of the instructions about where and how to use these. Pharmacies typically print the medication instructions only on the boxes and not directly on the medication tubes.   If your medication is too expensive, please contact our office at [phone] option 4 or send us  a message through MyChart.   We are unable to tell what your co-pay for medications will be in advance as this is different depending on your insurance coverage. However, we may be able to find a substitute medication at lower cost or fill out paperwork to get insurance to cover a needed medication.   If a prior authorization is required to get your medication covered by your insurance company, please allow us  1-2 business days to complete this process.  Drug prices often vary depending on where the prescription is filled and some pharmacies may offer cheaper prices.  The website www.goodrx.com contains coupons for medications through different pharmacies. The prices here do not account for what the cost may be with help from insurance (it may be cheaper with your insurance), but the website can give you the price if you did not use any insurance.  - You can print the associated coupon and take it with your prescription to the pharmacy.  - You may also stop by our office during  regular business hours and pick up a GoodRx coupon card.  - If you need your prescription sent electronically to a different pharmacy, notify our office through [facility] or by phone at [phone] option 4.     Si Usted Necesita Algo Despus de Su Visita  Tambin puede enviarnos un mensaje a travs de Clinical cytogeneticist. Por lo general respondemos a los mensajes de MyChart en el transcurso de 1 a 2 das hbiles.  Para renovar recetas, por favor pida a su farmacia que se ponga en contacto con nuestra oficina. Franz Jacks de fax es [place] [phone].  Si tiene un asunto urgente cuando la clnica est cerrada y que no puede esperar hasta el siguiente da hbil, puede llamar/localizar a su doctor(a) al nmero que aparece a continuacin.   Por favor, tenga en cuenta que aunque hacemos todo lo posible para estar disponibles para asuntos  urgentes fuera del horario de [place], no estamos disponibles las 24 horas del da, los 7 [address]  [address] de la [place].   Si tiene un problema urgente y no puede comunicarse con nosotros, puede optar por buscar atencin mdica  en el consultorio de su doctor(a), en una clnica privada, en un centro de atencin urgente o en una sala de emergencias.  Si tiene Engineer, drilling, por favor llame inmediatamente al 911 o vaya a la sala de emergencias.  Nmeros de bper  - Dr. Bary Likes: [phone]  - Dra. Annette Barters: [phone]  - Dr. Felipe Horton: [phone]   En caso de inclemencias del tiempo, por favor llame a Lajuan Pila principal al [phone] para una actualizacin sobre el [place] de cualquier retraso o cierre.  Consejos para la medicacin en dermatologa: Por favor, guarde las cajas en las que vienen los medicamentos de uso tpico para ayudarle a seguir las instrucciones sobre dnde y cmo usarlos. Las farmacias generalmente imprimen las instrucciones del medicamento slo en las cajas y no directamente en los tubos del [place].   Si su medicamento es muy  caro, por favor, pngase en contacto con Bettyjane Brunet llamando al [phone] y presione la opcin 4 o envenos un mensaje a travs de Clinical cytogeneticist.   No podemos decirle cul ser su copago por los medicamentos por adelantado ya que esto es diferente dependiendo de la cobertura de su seguro. Sin embargo, es posible que podamos encontrar un medicamento sustituto a Audiological scientist un formulario para que el seguro cubra el medicamento que se considera necesario.   Si se requiere una autorizacin previa para que su compaa de seguros [place] su medicamento, por favor permtanos de 1 a 2 das hbiles para completar este proceso.  Los precios de los medicamentos varan con frecuencia dependiendo del Environmental consultant de dnde se surte la receta y alguna farmacias pueden ofrecer precios ms baratos.  El sitio web www.goodrx.com tiene cupones para medicamentos de Health and safety inspector. Los precios aqu no tienen en cuenta lo que podra costar con la ayuda del seguro (puede ser ms barato con su seguro), pero el sitio web puede darle el precio si no utiliz Tourist information centre manager.  - Puede imprimir el cupn correspondiente y llevarlo con su receta a la farmacia.  - Tambin puede pasar por nuestra oficina durante el horario de atencin regular y Education officer, museum una tarjeta de cupones de GoodRx.  - Si necesita que su receta se enve electrnicamente a una farmacia diferente, informe a nuestra oficina a travs de MyChart de [place] o por telfono llamando al [phone] y presione la opcin 4.

## 2024-04-02 ENCOUNTER — Encounter: Payer: Self-pay | Admitting: Oncology

## 2024-04-02 ENCOUNTER — Inpatient Hospital Stay

## 2024-04-02 ENCOUNTER — Inpatient Hospital Stay: Admitting: Oncology

## 2024-04-02 ENCOUNTER — Inpatient Hospital Stay: Attending: Oncology

## 2024-04-02 VITALS — BP 135/70 | HR 71 | Temp 97.1°F | Resp 18 | Ht <= 58 in | Wt 102.4 lb

## 2024-04-02 VITALS — BP 140/70

## 2024-04-02 DIAGNOSIS — Z79620 Long term (current) use of immunosuppressive biologic: Secondary | ICD-10-CM | POA: Insufficient documentation

## 2024-04-02 DIAGNOSIS — C642 Malignant neoplasm of left kidney, except renal pelvis: Secondary | ICD-10-CM | POA: Diagnosis not present

## 2024-04-02 DIAGNOSIS — Z5112 Encounter for antineoplastic immunotherapy: Secondary | ICD-10-CM | POA: Diagnosis not present

## 2024-04-02 LAB — CBC WITH DIFFERENTIAL (CANCER CENTER ONLY)
Abs Immature Granulocytes: 0.03 10*3/uL (ref 0.00–0.07)
Basophils Absolute: 0 10*3/uL (ref 0.0–0.1)
Basophils Relative: 1 %
Eosinophils Absolute: 0.2 10*3/uL (ref 0.0–0.5)
Eosinophils Relative: 3 %
HCT: 33.5 % — ABNORMAL LOW (ref 36.0–46.0)
Hemoglobin: 10.6 g/dL — ABNORMAL LOW (ref 12.0–15.0)
Immature Granulocytes: 1 %
Lymphocytes Relative: 29 %
Lymphs Abs: 2 10*3/uL (ref 0.7–4.0)
MCH: 26.8 pg (ref 26.0–34.0)
MCHC: 31.6 g/dL (ref 30.0–36.0)
MCV: 84.6 fL (ref 80.0–100.0)
Monocytes Absolute: 0.6 10*3/uL (ref 0.1–1.0)
Monocytes Relative: 9 %
Neutro Abs: 3.8 10*3/uL (ref 1.7–7.7)
Neutrophils Relative %: 57 %
Platelet Count: 157 10*3/uL (ref 150–400)
RBC: 3.96 MIL/uL (ref 3.87–5.11)
RDW: 13.2 % (ref 11.5–15.5)
WBC Count: 6.7 10*3/uL (ref 4.0–10.5)
nRBC: 0 % (ref 0.0–0.2)

## 2024-04-02 LAB — CMP (CANCER CENTER ONLY)
ALT: 16 U/L (ref 0–44)
AST: 24 U/L (ref 15–41)
Albumin: 3.8 g/dL (ref 3.5–5.0)
Alkaline Phosphatase: 59 U/L (ref 38–126)
Anion gap: 8 (ref 5–15)
BUN: 27 mg/dL — ABNORMAL HIGH (ref 8–23)
CO2: 23 mmol/L (ref 22–32)
Calcium: 8.7 mg/dL — ABNORMAL LOW (ref 8.9–10.3)
Chloride: 105 mmol/L (ref 98–111)
Creatinine: 1.8 mg/dL — ABNORMAL HIGH (ref 0.44–1.00)
GFR, Estimated: 28 mL/min — ABNORMAL LOW (ref >60)
Glucose, Bld: 96 mg/dL (ref 70–99)
Potassium: 4.9 mmol/L (ref 3.5–5.1)
Sodium: 136 mmol/L (ref 135–145)
Total Bilirubin: 0.6 mg/dL (ref 0.0–1.2)
Total Protein: 6.5 g/dL (ref 6.5–8.1)

## 2024-04-02 MED ORDER — SODIUM CHLORIDE 0.9 % IV SOLN
INTRAVENOUS | Status: DC
  Filled 2024-04-02: qty 250

## 2024-04-02 MED ORDER — HEPARIN SOD (PORK) LOCK FLUSH 100 UNIT/ML IV SOLN
500.0000 [IU] | Freq: Once | INTRAVENOUS | Status: AC | PRN
  Administered 2024-04-02: 500 [IU]
  Filled 2024-04-02: qty 5

## 2024-04-02 MED ORDER — SODIUM CHLORIDE 0.9 % IV SOLN
200.0000 mg | Freq: Once | INTRAVENOUS | Status: AC
  Administered 2024-04-02: 200 mg via INTRAVENOUS
  Filled 2024-04-02: qty 8

## 2024-04-02 NOTE — Progress Notes (Signed)
 [facility]  Telephone:(336) [phone] Fax:(336) [phone]  ID: Jessica Morrison OB: December 14, 1945  MR#: [id number]  RDW#:[id number]  Patient Care Team: Auston Reyes BIRCH, MD as PCP - General (Internal Medicine) Jacobo Evalene PARAS, MD as Consulting Physician (Oncology) Penne Knee, MD as Consulting Physician (Urology)  CHIEF COMPLAINT: Recurrent urothelial carcinoma of the kidney.  INTERVAL HISTORY: Patient returns to clinic today for further evaluation and consideration of cycle 5 of Keytruda .  She currently feels well and is asymptomatic.  She is tolerating her treatments without significant side effects.  She does not complain of any weakness or fatigue.  She continue to have a poor memory.  She has no neurologic complaints.  She denies any recent fevers or illnesses. She has no chest pain, shortness of breath, cough, or hemoptysis.  She denies any nausea, vomiting, constipation, or diarrhea. She has no urinary complaints.  Patient offers no specific complaints today.  REVIEW OF SYSTEMS:   Review of Systems  Constitutional: Negative.  Negative for fever, malaise/fatigue and weight loss.  Respiratory: Negative.  Negative for cough and shortness of breath.   Cardiovascular: Negative.  Negative for chest pain and leg swelling.  Gastrointestinal: Negative.  Negative for abdominal pain and diarrhea.  Genitourinary: Negative.  Negative for hematuria.  Musculoskeletal: Negative.  Negative for back pain.  Skin: Negative.  Negative for rash.  Neurological: Negative.  Negative for dizziness, focal weakness, weakness and headaches.  Psychiatric/Behavioral:  Positive for memory loss. The patient is not nervous/anxious.     As per HPI. Otherwise, a complete review of systems is negative.  PAST MEDICAL HISTORY: Past Medical History:  Diagnosis Date   B12 deficiency    Back pain    Basal cell carcinoma 08/04/2020   0.5cm above the left mid brow, EDC 09/15/20   Cancer (HCC)     Melanoma   Curvature of spine    Dysplastic nevus 04/30/2013   left post shoulder, left parasternal   Dysplastic nevus 05/04/2014   right prox ant deltoid, left lat buttock   Dysplastic nevus 03/26/2019   right lat neck adjacent to the inf top of MM in situ site scar   Dysplastic nevus 10/29/2019   upper back left paraspinal at level of mid scapula   Dysplastic nevus 03/31/2020, 05/12/20 shave removal   Left mid pretibial. Moderate to severe atypia, deep margin involved.   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   right lat neck inf to right angle of mandible, excised: 07/29/2018, margins free   Memory loss    Mild mitral regurgitation by prior echocardiogram    Nephrolithiasis    Osteoarthritis    Osteopenia    Osteoporosis    Pelvic pain in female    PONV (postoperative nausea and vomiting)    Pre-diabetes    Raynaud disease    Renal cancer, left (HCC) 03/2023   Thyromegaly    Transitional cell carcinoma of kidney, left (HCC)    Urothelial carcinoma of kidney, left (HCC)     PAST SURGICAL HISTORY: Past Surgical History:  Procedure Laterality Date  ABDOMINAL HYSTERECTOMY     BILATERAL OOPHORECTOMY  1999   BLADDER INSTILLATION N/A 04/01/2023   Procedure: BLADDER INSTILLATION OF GEMCITABINE ;  Surgeon: Penne Knee, MD;  Location: [facility];  Service: Urology;  Laterality: N/A;   BLADDER INSTILLATION N/A 07/01/2023   Procedure: BLADDER INSTILLATION OF GEMCITABINE ;  Surgeon: Penne Knee, MD;  Location: [facility];  Service: Urology;  Laterality: N/A;   CATARACT EXTRACTION W/PHACO Right 05/09/2023   Procedure: CATARACT EXTRACTION PHACO  AND INTRAOCULAR LENS PLACEMENT (IOC) RIGHT MALYUGIN OMIDRIA  7.79 00:59.0;  Surgeon: Enola Feliciano Hugger, MD;  Location: [facility];  Service: Ophthalmology;  Laterality: Right;   CATARACT EXTRACTION W/PHACO Left 05/23/2023   Procedure: CATARACT EXTRACTION PHACO AND INTRAOCULAR LENS PLACEMENT (IOC) LEFT MALYUGIN OMIDRIA  12.13 1:23.6;  Surgeon: Enola Feliciano Hugger, MD;  Location: [facility];  Service: Ophthalmology;  Laterality: Left;   CESAREAN SECTION     COLONOSCOPY  09/02/2020   COLONOSCOPY WITH PROPOFOL  N/A 05/09/2015   Procedure: COLONOSCOPY WITH PROPOFOL ;  Surgeon: Donnice Vaughn Manes, MD;  Location: [facility];  Service: Endoscopy;  Laterality: N/A;   CYSTOSCOPY W/ RETROGRADES Right 07/01/2023   Procedure: CYSTOSCOPY WITH RETROGRADE PYELOGRAM;  Surgeon: Penne Knee, MD;  Location: [facility];  Service: Urology;  Laterality: Right;   CYSTOSCOPY WITH RETROGRADE PYELOGRAM, URETEROSCOPY AND STENT PLACEMENT Left 10/09/2022   Procedure: CYSTOSCOPY WITH RETROGRADE PYELOGRAM, URETEROSCOPY AND STENT PLACEMENT;  Surgeon: Twylla Glendia BROCKS, MD;  Location: [facility];  Service: Urology;  Laterality: Left;   IR IMAGING GUIDED PORT INSERTION  12/06/2022   MELANOMA EXCISION Right 07/29/2018   lateral neck inf. to right angle of mandible   OOPHORECTOMY     ROBOT ASSITED LAPAROSCOPIC NEPHROURETERECTOMY Left 04/01/2023   Procedure: XI ROBOT ASSITED LAPAROSCOPIC NEPHROURETERECTOMY;  Surgeon: Penne Knee, MD;  Location: [facility];  Service: Urology;  Laterality: Left;   TRANSURETHRAL RESECTION OF BLADDER TUMOR N/A 07/01/2023   Procedure: TRANSURETHRAL RESECTION OF BLADDER TUMOR (TURBT);  Surgeon: Penne Knee, MD;  Location: [facility];  Service: Urology;  Laterality: N/A;   TRANSURETHRAL RESECTION OF BLADDER TUMOR N/A 12/23/2023   Procedure: TURBT (TRANSURETHRAL RESECTION OF BLADDER TUMOR);  Surgeon: Penne Knee, MD;  Location: [facility];  Service: Urology;  Laterality: N/A;    URETERAL BIOPSY Left 10/09/2022   Procedure: URETERAL BIOPSY;  Surgeon: Twylla Glendia BROCKS, MD;  Location: [facility];  Service: Urology;  Laterality: Left;    FAMILY HISTORY: Family History  Problem Relation Age of Onset   Emphysema Father    Cancer Neg Hx    Heart disease Neg Hx    Diabetes Neg Hx    Breast cancer Neg Hx     ADVANCED DIRECTIVES (Y/N):  N  HEALTH MAINTENANCE: Social History   Tobacco Use   Smoking status: Never    Passive exposure: Never   Smokeless tobacco: Never  Vaping Use   Vaping status: Never Used  Substance Use Topics   Alcohol use: No   Drug use: No     Colonoscopy:  PAP:  Bone density:  Lipid panel:  Allergies  Allergen Reactions   Codeine Nausea And Vomiting   Neosporin Wound Cleanser [Benzalkonium Chloride] Other (See Comments)    Skin blisters   Penicillins Hives    Current Outpatient Medications  Medication Sig Dispense Refill   acetaminophen  (TYLENOL ) 325 MG tablet Take 650 mg by mouth every 6 (six) hours as needed for moderate pain (pain score 4-6).     calcium -vitamin D (OSCAL WITH D) 500-200 MG-UNIT tablet  1 tablet daily with breakfast.     Cholecalciferol 25 MCG (1000 UT) tablet Take 1,000 Units by mouth daily.     cyanocobalamin  1000 MCG tablet Take 1,000 mcg by mouth daily.     desvenlafaxine (PRISTIQ) 25 MG 24 hr tablet Take 25 mg by mouth daily.     diphenoxylate -atropine  (LOMOTIL ) 2.5-0.025 MG tablet Take 1 tablet by mouth 4 (four) times daily as needed for diarrhea or loose stools. TAKE ONE TABLET BY MOUTH FOUR (FOUR) TIMES DAILY AS NEEDED FOR DIARRHEA OR LOOSE STOOLS. 30 tablet 0   donepezil  (ARICEPT ) 10 MG tablet Take 10 mg by mouth every evening.     Efinaconazole (JUBLIA) 10 % SOLN Apply to affected toenails every night at bedtime. 8 mL 11   ketoconazole (NIZORAL) 2 % cream Apply twice daily to feet until clear 60 g 11   lidocaine -prilocaine  (EMLA ) cream APPLY TO AFFECTED AREAS ONCE DAILY 30 g 3   memantine  (NAMENDA ) 5  MG tablet Take 5 mg by mouth in the morning and at bedtime.     omeprazole (PRILOSEC) 40 MG capsule 40 mg daily.     oxybutynin  (DITROPAN ) 5 MG tablet Take 1 tablet (5 mg total) by mouth every 8 (eight) hours as needed for bladder spasms. 30 tablet 0   raloxifene  (EVISTA ) 60 MG tablet TAKE ONE TABLET BY MOUTH ONCE A DAY 90 tablet 2   rosuvastatin  (CRESTOR ) 5 MG tablet Take 5 mg by mouth daily.     saccharomyces boulardii (FLORASTOR) 250 MG capsule Take 250 mg by mouth 2 (two) times daily.     vancomycin  (VANCOCIN ) 125 MG capsule Take 1 capsule (125 mg total) by mouth 4 (four) times daily. (Patient not taking: Reported on 03/12/2024) 40 capsule 0   No current facility-administered medications for this visit.    OBJECTIVE: Vitals:   04/02/24 1028  BP: 135/70  Pulse: 71  Resp: 18  Temp: (!) 97.1 F (36.2 C)  SpO2: 100%     Body mass index is 22.16 kg/m.    ECOG FS:0 - Asymptomatic  General: Well-developed, well-nourished, no acute distress. Eyes: Pink conjunctiva, anicteric sclera. HEENT: Normocephalic, moist mucous membranes. Lungs: No audible wheezing or coughing. Heart: Regular rate and rhythm. Abdomen: Soft, nontender, no obvious distention. Musculoskeletal: No edema, cyanosis, or clubbing. Neuro: Alert, answering all questions appropriately. Cranial nerves grossly intact. Skin: No rashes or petechiae noted. Psych: Normal affect.  LAB RESULTS:  Lab Results  Component Value Date   NA 136 04/02/2024   K 4.9 04/02/2024   CL 105 04/02/2024   CO2 23 04/02/2024   GLUCOSE 96 04/02/2024   BUN 27 (H) 04/02/2024   CREATININE 1.80 (H) 04/02/2024   CALCIUM  8.7 (L) 04/02/2024   PROT 6.5 04/02/2024   ALBUMIN  3.8 04/02/2024   AST 24 04/02/2024   ALT 16 04/02/2024   ALKPHOS 59 04/02/2024   BILITOT 0.6 04/02/2024   GFRNONAA 28 (L) 04/02/2024    Lab Results  Component Value Date   WBC 6.7 04/02/2024   NEUTROABS 3.8 04/02/2024   HGB 10.6 (L) 04/02/2024   HCT 33.5 (L)  04/02/2024   MCV 84.6 04/02/2024   PLT 157 04/02/2024     STUDIES: No results found.    ASSESSMENT: Recurrent urothelial carcinoma of the kidney.  PLAN:    Recurrent urothelial carcinoma of the kidney:  Patient completed 4 cycles of neoadjuvant cisplatin  and gemcitabine  on Feb 19, 2023.  She underwent complete R0 surgical resection on April 01, 2023.  Patient also recently underwent BCG treatments for noninvasive lesions in her bladder.  Repeat cystoscopy on December 23, 2023 revealed a recurrence of a high-grade invasive urothelial carcinoma.  After lengthy discussion with the patient and her daughter, is agreed upon that surgery and chemotherapy were no longer options for treatment.  She has agreed to single agent Keytruda  every 3 weeks.  Hospice and comfort care were previously discussed, but patient would like to try treatment first.  She expressed understanding that there is likely no further treatment options after the immunotherapy.  Proceed with cycle 5 of Keytruda  today.  Return to clinic in 3 weeks for further evaluation and consideration of cycle 6.  Patient has an appointment and cystoscopy scheduled with urology on April 08, 2024.  Memory loss: Chronic and unchanged.  Continue follow-up with neurology as indicated.   Renal insufficiency: GFR has trended down slightly to 28.  Keytruda  does not need to be dose reduced due to renal insufficiency.  Follow-up with neurology as above.  Diarrhea: Resolved.  Unrelated to Keytruda  as symptoms were evident prior to initiating treatment.  Previously, patient found to be colonized with C. difficile and treated with oral vancomycin .  Continue Lomotil  as prescribed and patient was previously given a referral to GI for further evaluation. Anemia: Chronic and unchanged.  Patient's hemoglobin is 10.6 today.  I spent a total of 30 minutes reviewing chart data, face-to-face evaluation with the patient, counseling and coordination of care as detailed  above.    Patient expressed understanding and was in agreement with this plan. She also understands that She can call clinic at any time with any questions, concerns, or complaints.    Cancer Staging  Urothelial carcinoma of kidney [facility]) Staging form: Kidney, AJCC 8th Edition - Clinical stage from 12/01/2022: Stage Unknown (cTX, cN0, cM0) - Signed by Jacobo Evalene PARAS, MD on 12/01/2022 Stage prefix: Initial diagnosis Histologic grade (G): G3 Histologic grading system: 4 grade system   Evalene PARAS Jacobo, MD   04/03/2024 8:27 AM

## 2024-04-02 NOTE — Patient Instructions (Signed)
 [facility] - A DEPT OF [facility]  Discharge Instructions: Thank you for choosing [facility] to provide your oncology and hematology care.  If you have a lab appointment with the Cancer Center, please go directly to the Cancer Center and check in at the registration area.  Wear comfortable clothing and clothing appropriate for easy access to any Portacath or PICC line.   We strive to give you quality time with your provider. You may need to reschedule your appointment if you arrive late (15 or more minutes).  Arriving late affects you and other patients whose appointments are after yours.  Also, if you miss three or more appointments without notifying the office, you may be dismissed from the clinic at the provider's discretion.      For prescription refill requests, have your pharmacy contact our office and allow 72 hours for refills to be completed.    Today you received the following chemotherapy and/or immunotherapy agents KEYTRUDA      To help prevent nausea and vomiting after your treatment, we encourage you to take your nausea medication as directed.  BELOW ARE SYMPTOMS THAT SHOULD BE REPORTED IMMEDIATELY: *FEVER GREATER THAN 100.4 F (38 C) OR HIGHER *CHILLS OR SWEATING *NAUSEA AND VOMITING THAT IS NOT CONTROLLED WITH YOUR NAUSEA MEDICATION *UNUSUAL SHORTNESS OF BREATH *UNUSUAL BRUISING OR BLEEDING *URINARY PROBLEMS (pain or burning when urinating, or frequent urination) *BOWEL PROBLEMS (unusual diarrhea, constipation, pain near the anus) TENDERNESS IN MOUTH AND THROAT WITH OR WITHOUT PRESENCE OF ULCERS (sore throat, sores in mouth, or a toothache) UNUSUAL RASH, SWELLING OR PAIN  UNUSUAL VAGINAL DISCHARGE OR ITCHING   Items with * indicate a potential emergency and should be followed up as soon as possible or go to the Emergency Department if any problems should occur.  Please show the CHEMOTHERAPY ALERT CARD or IMMUNOTHERAPY  ALERT CARD at check-in to the Emergency Department and triage nurse.  Should you have questions after your visit or need to cancel or reschedule your appointment, please contact [facility] - A DEPT OF [facility] [place]  [phone] and follow the prompts.  Office hours are 8:00 a.m. to 4:30 p.m. Monday - Friday. Please note that voicemails left after 4:00 p.m. may not be returned until the following business day.  We are closed weekends and major holidays. You have access to a nurse at all times for urgent questions. Please call the main number to the clinic [phone] and follow the prompts.  For any non-urgent questions, you may also contact your provider using MyChart. We now offer e-Visits for anyone 71 and older to request care online for non-urgent symptoms. For details visit [email or web address].   Also download the MyChart app! Go to the app store, search "MyChart", open the app, select [place], and log in with your MyChart username and password.   Pembrolizumab Injection What is this medication? PEMBROLIZUMAB (PEM broe LIZ ue mab) treats some types of cancer. It works by helping your immune system slow or stop the spread of cancer cells. It is a monoclonal antibody. This medicine may be used for other purposes; ask your health care provider or pharmacist if you have questions. COMMON BRAND NAME(S): Keytruda What should I tell my care team before I take this medication? They need to know if you have any of these conditions: Allogeneic stem cell transplant (uses someone else's stem cells) Autoimmune diseases, such as Crohn  disease, ulcerative colitis, lupus History of chest radiation Nervous system problems, such as Guillain-Barre syndrome, myasthenia gravis Organ transplant An unusual or allergic reaction to pembrolizumab, other medications, foods, dyes, or preservatives Pregnant or trying to get pregnant Breast-feeding How should I use this  medication? This medication is injected into a vein. It is given by your care team in a hospital or clinic setting. A special MedGuide will be given to you before each treatment. Be sure to read this information carefully each time. Talk to your care team about the use of this medication in children. While it may be prescribed for children as young as 6 months for selected conditions, precautions do apply. Overdosage: If you think you have taken too much of this medicine contact a poison control center or emergency room at once. NOTE: This medicine is only for you. Do not share this medicine with others. What if I miss a dose? Keep appointments for follow-up doses. It is important not to miss your dose. Call your care team if you are unable to keep an appointment. What may interact with this medication? Interactions have not been studied. This list may not describe all possible interactions. Give your health care provider a list of all the medicines, herbs, non-prescription drugs, or dietary supplements you use. Also tell them if you smoke, drink alcohol, or use illegal drugs. Some items may interact with your medicine. What should I watch for while using this medication? Your condition will be monitored carefully while you are receiving this medication. You may need blood work while taking this medication. This medication may cause serious skin reactions. They can happen weeks to months after starting the medication. Contact your care team right away if you notice fevers or flu-like symptoms with a rash. The rash may be red or purple and then turn into blisters or peeling of the skin. You may also notice a red rash with swelling of the face, lips, or lymph nodes in your neck or under your arms. Tell your care team right away if you have any change in your eyesight. Talk to your care team if you may be pregnant. Serious birth defects can occur if you take this medication during pregnancy and for 4  months after the last dose. You will need a negative pregnancy test before starting this medication. Contraception is recommended while taking this medication and for 4 months after the last dose. Your care team can help you find the option that works for you. Do not breastfeed while taking this medication and for 4 months after the last dose. What side effects may I notice from receiving this medication? Side effects that you should report to your care team as soon as possible: Allergic reactions--skin rash, itching, hives, swelling of the face, lips, tongue, or throat Dry cough, shortness of breath or trouble breathing Eye pain, redness, irritation, or discharge with blurry or decreased vision Heart muscle inflammation--unusual weakness or fatigue, shortness of breath, chest pain, fast or irregular heartbeat, dizziness, swelling of the ankles, feet, or hands Hormone gland problems--headache, sensitivity to light, unusual weakness or fatigue, dizziness, fast or irregular heartbeat, increased sensitivity to cold or heat, excessive sweating, constipation, hair loss, increased thirst or amount of urine, tremors or shaking, irritability Infusion reactions--chest pain, shortness of breath or trouble breathing, feeling faint or lightheaded Kidney injury (glomerulonephritis)--decrease in the amount of urine, red or dark brown urine, foamy or bubbly urine, swelling of the ankles, hands, or feet Liver injury--right upper belly pain,  loss of appetite, nausea, light-colored stool, dark yellow or brown urine, yellowing skin or eyes, unusual weakness or fatigue Pain, tingling, or numbness in the hands or feet, muscle weakness, change in vision, confusion or trouble speaking, loss of balance or coordination, trouble walking, seizures Rash, fever, and swollen lymph nodes Redness, blistering, peeling, or loosening of the skin, including inside the mouth Sudden or severe stomach pain, bloody diarrhea, fever, nausea,  vomiting Side effects that usually do not require medical attention (report to your care team if they continue or are bothersome): Bone, joint, or muscle pain Diarrhea Fatigue Loss of appetite Nausea Skin rash This list may not describe all possible side effects. Call your doctor for medical advice about side effects. You may report side effects to FDA at 1-800-FDA-1088. Where should I keep my medication? This medication is given in a hospital or clinic. It will not be stored at home. NOTE: This sheet is a summary. It may not cover all possible information. If you have questions about this medicine, talk to your doctor, pharmacist, or health care provider.  2024 Elsevier/Gold Standard (2022-02-13 00:00:00)

## 2024-04-03 ENCOUNTER — Encounter: Payer: Self-pay | Admitting: Oncology

## 2024-04-08 ENCOUNTER — Other Ambulatory Visit: Admitting: Urology

## 2024-04-08 ENCOUNTER — Ambulatory Visit: Admitting: Urology

## 2024-04-08 VITALS — BP 165/79 | HR 97 | Ht <= 58 in | Wt 102.0 lb

## 2024-04-08 DIAGNOSIS — C679 Malignant neoplasm of bladder, unspecified: Secondary | ICD-10-CM | POA: Diagnosis not present

## 2024-04-08 DIAGNOSIS — D494 Neoplasm of unspecified behavior of bladder: Secondary | ICD-10-CM

## 2024-04-08 LAB — URINALYSIS, COMPLETE
Bilirubin, UA: NEGATIVE
Glucose, UA: NEGATIVE
Ketones, UA: NEGATIVE
Leukocytes,UA: NEGATIVE
Nitrite, UA: NEGATIVE
Protein,UA: NEGATIVE
Specific Gravity, UA: 1.01 (ref 1.005–1.030)
Urobilinogen, Ur: 0.2 mg/dL (ref 0.2–1.0)
pH, UA: 6 (ref 5.0–7.5)

## 2024-04-08 LAB — MICROSCOPIC EXAMINATION: Epithelial Cells (non renal): >10 /HPF — AB (ref 0–10)

## 2024-04-08 NOTE — Progress Notes (Unsigned)
   04/08/24  CC:  Chief Complaint  Patient presents with   Cysto    HPI: Refer to Dr. Bjorn previous note 12/31/2023.  Did not undergo reinduction BCG and has been treated with Keytruda -completed cycle 5 on 04/02/2024.  She presents today for surveillance cystoscopy.  Blood pressure (!) 165/79, pulse 97, height 4' 9 (1.448 m), weight 102 lb (46.3 kg). NED. A&Ox3.   No respiratory distress   Abd soft, NT, ND Normal external genitalia with patent urethral meatus  Cystoscopy Procedure Note  Patient identification was confirmed, informed consent was obtained, and patient was prepped using Betadine solution.  Lidocaine  jelly was administered per urethral meatus.    Procedure: - Flexible cystoscope introduced, without any difficulty.   - Thorough search of the bladder revealed:    Numerous multifocal papillary tumors posterior wall, lateral walls with tumors ranging in size from 0.5-1.5 cm  - Ureteral orifices were normal in position and appearance.  Post-Procedure: - Patient tolerated the procedure well  Assessment/ Plan: Findings were discussed with patient and her daughter.  Her daughter states he is at the point that she does not desire any additional surgery Will discuss with Dr. Jacobo Glendia JAYSON Twylla, MD

## 2024-04-09 ENCOUNTER — Encounter: Payer: Self-pay | Admitting: Urology

## 2024-04-09 ENCOUNTER — Encounter: Payer: Self-pay | Admitting: Oncology

## 2024-04-14 ENCOUNTER — Inpatient Hospital Stay: Attending: Oncology | Admitting: Oncology

## 2024-04-14 ENCOUNTER — Encounter: Payer: Self-pay | Admitting: Oncology

## 2024-04-14 ENCOUNTER — Inpatient Hospital Stay (HOSPITAL_BASED_OUTPATIENT_CLINIC_OR_DEPARTMENT_OTHER): Admitting: Hospice and Palliative Medicine

## 2024-04-14 VITALS — BP 156/75 | HR 64 | Temp 98.6°F | Resp 19 | Ht <= 58 in | Wt 101.2 lb

## 2024-04-14 DIAGNOSIS — C642 Malignant neoplasm of left kidney, except renal pelvis: Secondary | ICD-10-CM

## 2024-04-14 DIAGNOSIS — D649 Anemia, unspecified: Secondary | ICD-10-CM | POA: Diagnosis not present

## 2024-04-14 DIAGNOSIS — Z515 Encounter for palliative care: Secondary | ICD-10-CM | POA: Diagnosis not present

## 2024-04-14 DIAGNOSIS — C679 Malignant neoplasm of bladder, unspecified: Secondary | ICD-10-CM | POA: Insufficient documentation

## 2024-04-14 NOTE — Progress Notes (Signed)
 [facility]  [phone] Fax:(336) [phone]  ID: Jessica Morrison OB: 1946/06/24  MR#: [id number]  RDW#:[id number]  Patient Care Team: Auston Reyes BIRCH, MD as PCP - General (Internal Medicine) Jacobo Evalene PARAS, MD as Consulting Physician (Oncology) Penne Knee, MD as Consulting Physician (Urology)  CHIEF COMPLAINT: Recurrent urothelial carcinoma of the kidney.  INTERVAL HISTORY: Patient returns to clinic today for further evaluation and discussion on whether or not to enroll in hospice.  Recent cystoscopy revealed progression of disease despite treatment with Keytruda .  She currently feels well.  She continues to have a poor memory.  She has no neurologic complaints.  She denies any recent fevers or illnesses. She has no chest pain, shortness of breath, cough, or hemoptysis.  She denies any nausea, vomiting, constipation, or diarrhea. She has no urinary complaints.  Patient offers no further specific complaints today.  REVIEW OF SYSTEMS:   Review of Systems  Constitutional: Negative.  Negative for fever, malaise/fatigue and weight loss.  Respiratory: Negative.  Negative for cough and shortness of breath.   Cardiovascular: Negative.  Negative for chest pain and leg swelling.  Gastrointestinal: Negative.  Negative for abdominal pain and diarrhea.  Genitourinary: Negative.  Negative for hematuria.  Musculoskeletal: Negative.  Negative for back pain.  Skin: Negative.  Negative for rash.  Neurological: Negative.  Negative for dizziness, focal weakness, weakness and headaches.  Psychiatric/Behavioral:  Positive for memory loss. The patient is not nervous/anxious.     As per HPI. Otherwise, a complete review of systems is negative.  PAST MEDICAL HISTORY: Past Medical History:  Diagnosis Date   B12 deficiency    Back pain    Basal cell carcinoma 08/04/2020   0.5cm above the left mid brow, EDC 09/15/20   Cancer (HCC)    Melanoma   Curvature of  spine    Dysplastic nevus 04/30/2013   left post shoulder, left parasternal   Dysplastic nevus 05/04/2014   right prox ant deltoid, left lat buttock   Dysplastic nevus 03/26/2019   right lat neck adjacent to the inf top of MM in situ site scar   Dysplastic nevus 10/29/2019   upper back left paraspinal at level of mid scapula   Dysplastic nevus 03/31/2020, 05/12/20 shave removal   Left mid pretibial. Moderate to severe atypia, deep margin involved.   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   right lat neck inf to right angle of mandible, excised: 07/29/2018, margins free   Memory loss    Mild mitral regurgitation by prior echocardiogram    Nephrolithiasis    Osteoarthritis    Osteopenia    Osteoporosis    Pelvic pain in female    PONV (postoperative nausea and vomiting)    Pre-diabetes    Raynaud disease    Renal cancer, left (HCC) 03/2023   Thyromegaly    Transitional cell carcinoma of kidney, left (HCC)    Urothelial carcinoma of kidney, left (HCC)     PAST SURGICAL HISTORY: Past Surgical History:  Procedure Laterality Date   ABDOMINAL HYSTERECTOMY  BILATERAL OOPHORECTOMY  1999   BLADDER INSTILLATION N/A 04/01/2023   Procedure: BLADDER INSTILLATION OF GEMCITABINE ;  Surgeon: Penne Knee, MD;  Location: [facility];  Service: Urology;  Laterality: N/A;   BLADDER INSTILLATION N/A 07/01/2023   Procedure: BLADDER INSTILLATION OF GEMCITABINE ;  Surgeon: Penne Knee, MD;  Location: [facility];  Service: Urology;  Laterality: N/A;   CATARACT EXTRACTION W/PHACO Right 05/09/2023   Procedure: CATARACT EXTRACTION PHACO AND INTRAOCULAR LENS PLACEMENT  (IOC) RIGHT MALYUGIN OMIDRIA  7.79 00:59.0;  Surgeon: Enola Feliciano Hugger, MD;  Location: [facility];  Service: Ophthalmology;  Laterality: Right;   CATARACT EXTRACTION W/PHACO Left 05/23/2023   Procedure: CATARACT EXTRACTION PHACO AND INTRAOCULAR LENS PLACEMENT (IOC) LEFT MALYUGIN OMIDRIA  12.13 1:23.6;  Surgeon: Enola Feliciano Hugger, MD;  Location: [facility];  Service: Ophthalmology;  Laterality: Left;   CESAREAN SECTION     COLONOSCOPY  09/02/2020   COLONOSCOPY WITH PROPOFOL  N/A 05/09/2015   Procedure: COLONOSCOPY WITH PROPOFOL ;  Surgeon: Donnice Vaughn Manes, MD;  Location: [facility];  Service: Endoscopy;  Laterality: N/A;   CYSTOSCOPY W/ RETROGRADES Right 07/01/2023   Procedure: CYSTOSCOPY WITH RETROGRADE PYELOGRAM;  Surgeon: Penne Knee, MD;  Location: [facility];  Service: Urology;  Laterality: Right;   CYSTOSCOPY WITH RETROGRADE PYELOGRAM, URETEROSCOPY AND STENT PLACEMENT Left 10/09/2022   Procedure: CYSTOSCOPY WITH RETROGRADE PYELOGRAM, URETEROSCOPY AND STENT PLACEMENT;  Surgeon: Twylla Glendia BROCKS, MD;  Location: [facility];  Service: Urology;  Laterality: Left;   IR IMAGING GUIDED PORT INSERTION  12/06/2022   MELANOMA EXCISION Right 07/29/2018   lateral neck inf. to right angle of mandible   OOPHORECTOMY     ROBOT ASSITED LAPAROSCOPIC NEPHROURETERECTOMY Left 04/01/2023   Procedure: XI ROBOT ASSITED LAPAROSCOPIC NEPHROURETERECTOMY;  Surgeon: Penne Knee, MD;  Location: [facility];  Service: Urology;  Laterality: Left;   TRANSURETHRAL RESECTION OF BLADDER TUMOR N/A 07/01/2023   Procedure: TRANSURETHRAL RESECTION OF BLADDER TUMOR (TURBT);  Surgeon: Penne Knee, MD;  Location: [facility];  Service: Urology;  Laterality: N/A;   TRANSURETHRAL RESECTION OF BLADDER TUMOR N/A 12/23/2023   Procedure: TURBT (TRANSURETHRAL RESECTION OF BLADDER TUMOR);  Surgeon: Penne Knee, MD;  Location: [facility];  Service: Urology;  Laterality: N/A;   URETERAL BIOPSY Left 10/09/2022    Procedure: URETERAL BIOPSY;  Surgeon: Twylla Glendia BROCKS, MD;  Location: [facility];  Service: Urology;  Laterality: Left;    FAMILY HISTORY: Family History  Problem Relation Age of Onset   Emphysema Father    Cancer Neg Hx    Heart disease Neg Hx    Diabetes Neg Hx    Breast cancer Neg Hx     ADVANCED DIRECTIVES (Y/N):  N  HEALTH MAINTENANCE: Social History   Tobacco Use   Smoking status: Never    Passive exposure: Never   Smokeless tobacco: Never  Vaping Use   Vaping status: Never Used  Substance Use Topics   Alcohol use: No   Drug use: No     Colonoscopy:  PAP:  Bone density:  Lipid panel:  Allergies  Allergen Reactions   Codeine Nausea And Vomiting   Neosporin Wound Cleanser [Benzalkonium Chloride] Other (See Comments)    Skin blisters   Penicillins Hives    Current Outpatient Medications  Medication Sig Dispense Refill   acetaminophen  (TYLENOL ) 325 MG tablet Take 650 mg by mouth every 6 (six) hours as needed for moderate pain (pain score 4-6).     calcium -vitamin D (OSCAL WITH D) 500-200 MG-UNIT tablet 1 tablet daily with breakfast.  Cholecalciferol 25 MCG (1000 UT) tablet Take 1,000 Units by mouth daily.     cyanocobalamin  1000 MCG tablet Take 1,000 mcg by mouth daily.     desvenlafaxine (PRISTIQ) 25 MG 24 hr tablet Take 25 mg by mouth daily.     donepezil  (ARICEPT ) 10 MG tablet Take 10 mg by mouth every evening.     Efinaconazole (JUBLIA) 10 % SOLN Apply to affected toenails every night at bedtime. 8 mL 11   ketoconazole (NIZORAL) 2 % cream Apply twice daily to feet until clear 60 g 11   lidocaine -prilocaine  (EMLA ) cream APPLY TO AFFECTED AREAS ONCE DAILY 30 g 3   memantine  (NAMENDA ) 5 MG tablet Take 5 mg by mouth in the morning and at bedtime.     omeprazole (PRILOSEC) 40 MG capsule 40 mg daily.     oxybutynin  (DITROPAN ) 5 MG tablet Take 1 tablet (5 mg total) by mouth every 8 (eight) hours as needed for bladder spasms. 30 tablet 0   raloxifene  (EVISTA )  60 MG tablet TAKE ONE TABLET BY MOUTH ONCE A DAY 90 tablet 2   rosuvastatin  (CRESTOR ) 5 MG tablet Take 5 mg by mouth daily.     saccharomyces boulardii (FLORASTOR) 250 MG capsule Take 250 mg by mouth 2 (two) times daily.     No current facility-administered medications for this visit.    OBJECTIVE: Vitals:   04/14/24 0846  BP: (!) 156/75  Pulse: 64  Resp: 19  Temp: 98.6 F (37 C)  SpO2: 100%     Body mass index is 21.9 kg/m.    ECOG FS:0 - Asymptomatic  General: Well-developed, well-nourished, no acute distress. Eyes: Pink conjunctiva, anicteric sclera. HEENT: Normocephalic, moist mucous membranes. Lungs: No audible wheezing or coughing. Heart: Regular rate and rhythm. Abdomen: Soft, nontender, no obvious distention. Musculoskeletal: No edema, cyanosis, or clubbing. Neuro: Alert, answering all questions appropriately. Cranial nerves grossly intact. Skin: No rashes or petechiae noted. Psych: Normal affect.  LAB RESULTS:  Lab Results  Component Value Date   NA 136 04/02/2024   K 4.9 04/02/2024   CL 105 04/02/2024   CO2 23 04/02/2024   GLUCOSE 96 04/02/2024   BUN 27 (H) 04/02/2024   CREATININE 1.80 (H) 04/02/2024   CALCIUM  8.7 (L) 04/02/2024   PROT 6.5 04/02/2024   ALBUMIN  3.8 04/02/2024   AST 24 04/02/2024   ALT 16 04/02/2024   ALKPHOS 59 04/02/2024   BILITOT 0.6 04/02/2024   GFRNONAA 28 (L) 04/02/2024    Lab Results  Component Value Date   WBC 6.7 04/02/2024   NEUTROABS 3.8 04/02/2024   HGB 10.6 (L) 04/02/2024   HCT 33.5 (L) 04/02/2024   MCV 84.6 04/02/2024   PLT 157 04/02/2024     STUDIES: No results found.    ASSESSMENT: Recurrent urothelial carcinoma of the kidney.  PLAN:    Recurrent urothelial carcinoma of the kidney:  Patient completed 4 cycles of neoadjuvant cisplatin  and gemcitabine  on Feb 19, 2023.  She underwent complete R0 surgical resection on April 01, 2023.  Patient also underwent BCG treatments for noninvasive lesions in her bladder.   Repeat cystoscopy on December 23, 2023 revealed a recurrence of a high-grade invasive urothelial carcinoma.  Patient subsequently underwent 5 treatments of single agent Keytruda  most recently on April 02, 2024.  Cystoscopy on April 08, 2024 revealed progression of disease.  After lengthy discussion with the patient and her daughter, she did not wish to try further treatments.  She is not quite ready for hospice  today but will require their services in the future.  No further follow-up has been scheduled.  Continue follow-up with outpatient palliative care.  Appreciate palliative care input.   Memory loss: Chronic and unchanged.  Continue follow-up with neurology as indicated.   Renal insufficiency: No further laboratory work necessary.  Anemia: No further laboratory work necessary.  I spent a total of 30 minutes reviewing chart data, face-to-face evaluation with the patient, counseling and coordination of care as detailed above.  Patient expressed understanding and was in agreement with this plan. She also understands that She can call clinic at any time with any questions, concerns, or complaints.    Cancer Staging  Urothelial carcinoma of kidney [facility]) Staging form: Kidney, AJCC 8th Edition - Clinical stage from 12/01/2022: Stage Unknown (cTX, cN0, cM0) - Signed by Jacobo Evalene PARAS, MD on 12/01/2022 Stage prefix: Initial diagnosis Histologic grade (G): G3 Histologic grading system: 4 grade system   Evalene PARAS Jacobo, MD   04/14/2024 10:27 AM

## 2024-04-14 NOTE — Progress Notes (Signed)
 Palliative Medicine [facility] Telephone:(336) [phone] Fax:(336) [phone]   Name: Jessica Morrison Date: 04/14/2024 MRN: [id number]  DOB: 08-Feb-1946  Patient Care Team: Auston Reyes BIRCH, MD as PCP - General (Internal Medicine) Jacobo Evalene PARAS, MD as Consulting Physician (Oncology) Penne Knee, MD as Consulting Physician (Urology)    REASON FOR CONSULTATION: Jessica Morrison is a 78 y.o. female with multiple medical problems including CKD stage IV, dementia, upper tract urothelial cancer and multiple recurrent bladder cancer status post previous left nephrectomy/chemotherapy.  She is status post previous TURBT and instillation of intravascular gemcitabine  and BCG treatments.  She was referred to palliative care to address goals.  SOCIAL HISTORY:     reports that she has never smoked. She has never been exposed to tobacco smoke. She has never used smokeless tobacco. She reports that she does not drink alcohol and does not use drugs.  Patient is widowed.  She lives at home alone but her daughter lives next-door.  Patient previously worked in Designer, fashion/clothing.  ADVANCE DIRECTIVES:  On file  CODE STATUS: DNR/DNI (DNR order signed on 01/02/2024)  PAST MEDICAL HISTORY: Past Medical History:  Diagnosis Date   B12 deficiency    Back pain    Basal cell carcinoma 08/04/2020   0.5cm above the left mid brow, EDC 09/15/20   Cancer (HCC)    Melanoma   Curvature of spine    Dysplastic nevus 04/30/2013   left post shoulder, left parasternal   Dysplastic nevus 05/04/2014   right prox ant deltoid, left lat buttock   Dysplastic nevus 03/26/2019   right lat neck adjacent to the inf top of MM in situ site scar   Dysplastic nevus 10/29/2019   upper back left paraspinal at level of mid scapula   Dysplastic nevus 03/31/2020, 05/12/20 shave removal   Left mid pretibial. Moderate to severe atypia, deep margin involved.   Dysplastic nevus 06/29/2021   L  ant deltoid, moderat to severe atypia, excised 08/15/21   Dyspnea    Endometriosis    Fibrocystic disease of breast    Fibroid    GERD (gastroesophageal reflux disease)    Grade I diastolic dysfunction    History of kidney stones    HLD (hyperlipidemia)    Hyperlipemia    Hypertension    Kidney stone    Melanoma in situ (HCC) 07/03/2018   right lat neck inf to right angle of mandible, excised: 07/29/2018, margins free   Memory loss    Mild mitral regurgitation by prior echocardiogram    Nephrolithiasis    Osteoarthritis    Osteopenia    Osteoporosis    Pelvic pain in female    PONV (postoperative nausea and vomiting)    Pre-diabetes    Raynaud disease    Renal cancer, left (HCC) 03/2023   Thyromegaly    Transitional cell carcinoma of kidney, left (HCC)    Urothelial carcinoma of kidney, left (HCC)     PAST SURGICAL HISTORY:  Past Surgical History:  Procedure Laterality Date   ABDOMINAL HYSTERECTOMY     BILATERAL OOPHORECTOMY  1999   BLADDER INSTILLATION N/A 04/01/2023   Procedure: BLADDER INSTILLATION OF GEMCITABINE ;  Surgeon: Penne Knee, MD;  Location: [facility];  Service: Urology;  Laterality: N/A;   BLADDER INSTILLATION N/A 07/01/2023   Procedure: BLADDER INSTILLATION OF GEMCITABINE ;  Surgeon: Penne Knee, MD;  Location: [facility];  Service: Urology;  Laterality: N/A;   CATARACT EXTRACTION W/PHACO Right 05/09/2023  Procedure: CATARACT EXTRACTION PHACO AND INTRAOCULAR LENS PLACEMENT (IOC) RIGHT MALYUGIN OMIDRIA  7.79 00:59.0;  Surgeon: Enola Feliciano Hugger, MD;  Location: [facility];  Service: Ophthalmology;  Laterality: Right;   CATARACT EXTRACTION W/PHACO Left 05/23/2023   Procedure: CATARACT EXTRACTION PHACO AND INTRAOCULAR LENS PLACEMENT (IOC) LEFT MALYUGIN OMIDRIA  12.13 1:23.6;  Surgeon: Enola Feliciano Hugger, MD;  Location: [facility];  Service: Ophthalmology;  Laterality: Left;   CESAREAN SECTION     COLONOSCOPY  09/02/2020   COLONOSCOPY  WITH PROPOFOL  N/A 05/09/2015   Procedure: COLONOSCOPY WITH PROPOFOL ;  Surgeon: Donnice Vaughn Manes, MD;  Location: [facility];  Service: Endoscopy;  Laterality: N/A;   CYSTOSCOPY W/ RETROGRADES Right 07/01/2023   Procedure: CYSTOSCOPY WITH RETROGRADE PYELOGRAM;  Surgeon: Penne Knee, MD;  Location: [facility];  Service: Urology;  Laterality: Right;   CYSTOSCOPY WITH RETROGRADE PYELOGRAM, URETEROSCOPY AND STENT PLACEMENT Left 10/09/2022   Procedure: CYSTOSCOPY WITH RETROGRADE PYELOGRAM, URETEROSCOPY AND STENT PLACEMENT;  Surgeon: Twylla Glendia BROCKS, MD;  Location: [facility];  Service: Urology;  Laterality: Left;   IR IMAGING GUIDED PORT INSERTION  12/06/2022   MELANOMA EXCISION Right 07/29/2018   lateral neck inf. to right angle of mandible   OOPHORECTOMY     ROBOT ASSITED LAPAROSCOPIC NEPHROURETERECTOMY Left 04/01/2023   Procedure: XI ROBOT ASSITED LAPAROSCOPIC NEPHROURETERECTOMY;  Surgeon: Penne Knee, MD;  Location: [facility];  Service: Urology;  Laterality: Left;   TRANSURETHRAL RESECTION OF BLADDER TUMOR N/A 07/01/2023   Procedure: TRANSURETHRAL RESECTION OF BLADDER TUMOR (TURBT);  Surgeon: Penne Knee, MD;  Location: [facility];  Service: Urology;  Laterality: N/A;   TRANSURETHRAL RESECTION OF BLADDER TUMOR N/A 12/23/2023   Procedure: TURBT (TRANSURETHRAL RESECTION OF BLADDER TUMOR);  Surgeon: Penne Knee, MD;  Location: [facility];  Service: Urology;  Laterality: N/A;   URETERAL BIOPSY Left 10/09/2022   Procedure: URETERAL BIOPSY;  Surgeon: Twylla Glendia BROCKS, MD;  Location: [facility];  Service: Urology;  Laterality: Left;    HEMATOLOGY/ONCOLOGY HISTORY:  Oncology History  Urothelial carcinoma of kidney (HCC)  11/30/2022 Initial Diagnosis   Urothelial carcinoma of kidney (HCC)   12/01/2022 Cancer Staging   Staging form: Kidney, AJCC 8th Edition - Clinical stage from 12/01/2022: Stage Unknown (cTX, cN0, cM0) - Signed by Jacobo Evalene PARAS, MD on 12/01/2022 Stage prefix: Initial  diagnosis Histologic grade (G): G3 Histologic grading system: 4 grade system   12/11/2022 - 02/19/2023 Chemotherapy   Patient is on Treatment Plan : BLADDER Cisplatin  D1 + Gemcitabine  D1,8 q21d x 4 Cycles     01/09/2024 -  Chemotherapy   Patient is on Treatment Plan : BLADDER Pembrolizumab  (200) q21d       ALLERGIES:  is allergic to codeine, neosporin wound cleanser [benzalkonium chloride], and penicillins.  MEDICATIONS:  Current Outpatient Medications  Medication Sig Dispense Refill   acetaminophen  (TYLENOL ) 325 MG tablet Take 650 mg by mouth every 6 (six) hours as needed for moderate pain (pain score 4-6).     calcium -vitamin D (OSCAL WITH D) 500-200 MG-UNIT tablet 1 tablet daily with breakfast.     Cholecalciferol 25 MCG (1000 UT) tablet Take 1,000 Units by mouth daily.     cyanocobalamin  1000 MCG tablet Take 1,000 mcg by mouth daily.     desvenlafaxine (PRISTIQ) 25 MG 24 hr tablet Take 25 mg by mouth daily.     donepezil  (ARICEPT ) 10 MG tablet Take 10 mg by mouth every evening.     Efinaconazole (JUBLIA) 10 % SOLN Apply to affected toenails every night at  bedtime. 8 mL 11   ketoconazole (NIZORAL) 2 % cream Apply twice daily to feet until clear 60 g 11   lidocaine -prilocaine  (EMLA ) cream APPLY TO AFFECTED AREAS ONCE DAILY 30 g 3   memantine  (NAMENDA ) 5 MG tablet Take 5 mg by mouth in the morning and at bedtime.     omeprazole (PRILOSEC) 40 MG capsule 40 mg daily.     oxybutynin  (DITROPAN ) 5 MG tablet Take 1 tablet (5 mg total) by mouth every 8 (eight) hours as needed for bladder spasms. 30 tablet 0   raloxifene  (EVISTA ) 60 MG tablet TAKE ONE TABLET BY MOUTH ONCE A DAY 90 tablet 2   rosuvastatin  (CRESTOR ) 5 MG tablet Take 5 mg by mouth daily.     saccharomyces boulardii (FLORASTOR) 250 MG capsule Take 250 mg by mouth 2 (two) times daily.     No current facility-administered medications for this visit.    VITAL SIGNS: There were no vitals taken for this visit. There were no vitals  filed for this visit.  Estimated body mass index is 21.9 kg/m as calculated from the following:   Height as of an earlier encounter on 04/14/24: 4' 9 (1.448 m).   Weight as of an earlier encounter on 04/14/24: 101 lb 3.2 oz (45.9 kg).  LABS: CBC:    Component Value Date/Time   WBC 6.7 04/02/2024 0957   WBC 9.3 12/09/2023 1008   HGB 10.6 (L) 04/02/2024 0957   HGB 12.4 11/27/2022 1200   HCT 33.5 (L) 04/02/2024 0957   HCT 37.5 11/27/2022 1200   PLT 157 04/02/2024 0957   MCV 84.6 04/02/2024 0957   MCV 84 11/27/2022 1200   NEUTROABS 3.8 04/02/2024 0957   NEUTROABS 4.8 11/27/2022 1200   LYMPHSABS 2.0 04/02/2024 0957   LYMPHSABS 1.4 11/27/2022 1200   MONOABS 0.6 04/02/2024 0957   EOSABS 0.2 04/02/2024 0957   EOSABS 0.2 11/27/2022 1200   BASOSABS 0.0 04/02/2024 0957   BASOSABS 0.1 11/27/2022 1200   Comprehensive Metabolic Panel:    Component Value Date/Time   NA 136 04/02/2024 0957   K 4.9 04/02/2024 0957   CL 105 04/02/2024 0957   CO2 23 04/02/2024 0957   BUN 27 (H) 04/02/2024 0957   CREATININE 1.80 (H) 04/02/2024 0957   GLUCOSE 96 04/02/2024 0957   CALCIUM  8.7 (L) 04/02/2024 0957   AST 24 04/02/2024 0957   ALT 16 04/02/2024 0957   ALKPHOS 59 04/02/2024 0957   BILITOT 0.6 04/02/2024 0957   PROT 6.5 04/02/2024 0957   ALBUMIN  3.8 04/02/2024 0957    RADIOGRAPHIC STUDIES: No results found.   PERFORMANCE STATUS (ECOG) : 2 - Symptomatic, <50% confined to bed  Review of Systems Unless otherwise noted, a complete review of systems is negative.  Physical Exam General: NAD Cardiovascular: regular rate and rhythm Pulmonary: clear ant fields Abdomen: soft, nontender, + bowel sounds GU: no suprapubic tenderness Extremities: no edema, no joint deformities Skin: no rashes Neurological: Weakness but otherwise nonfocal  IMPRESSION: Patient has had recurrent high-grade urothelial cancer status post previous TURBTs.  Patient met with medical oncology today and patient  declined further treatment.  Patient and daughter confirm that they are not interested in any further interventions.  They want to maximize quality of life and focus on comfort at home.  Patient is followed by community palliative care.  We discussed the option of hospice but patient is doing well currently and does not feel that she would benefit from their services at this time.  Patient is in agreement with future hospice involvement and says that hospice cared for her husband prior to his passing.  Currently, patient denies any distressing symptoms or concerns.  PLAN: -Best supportive care -Hospice involvement when patient is ready -Patient will be followed by community palliative care -DNR/DNI -Follow-up telephone visit 1 to 2 months  Case and plan discussed with Dr. Jacobo  Patient expressed understanding and was in agreement with this plan. She also understands that She can call the clinic at any time with any questions, concerns, or complaints.     Time Total: 20 minutes  Visit consisted of counseling and education dealing with the complex and emotionally intense issues of symptom management and palliative care in the setting of serious and potentially life-threatening illness.Greater than 50%  of this time was spent counseling and coordinating care related to the above assessment and plan.  Signed by: Fonda Mower, PhD, NP-C

## 2024-04-23 ENCOUNTER — Other Ambulatory Visit

## 2024-04-23 ENCOUNTER — Ambulatory Visit

## 2024-04-23 ENCOUNTER — Ambulatory Visit: Admitting: Oncology

## 2024-05-12 ENCOUNTER — Emergency Department

## 2024-05-12 ENCOUNTER — Emergency Department
Admission: EM | Admit: 2024-05-12 | Discharge: 2024-05-12 | Disposition: A | Discharge status: Home / Self Care | Attending: Emergency Medicine | Admitting: Emergency Medicine

## 2024-05-12 DIAGNOSIS — N184 Chronic kidney disease, stage 4 (severe): Secondary | ICD-10-CM | POA: Diagnosis not present

## 2024-05-12 DIAGNOSIS — C679 Malignant neoplasm of bladder, unspecified: Secondary | ICD-10-CM | POA: Diagnosis not present

## 2024-05-12 DIAGNOSIS — N3090 Cystitis, unspecified without hematuria: Secondary | ICD-10-CM | POA: Insufficient documentation

## 2024-05-12 DIAGNOSIS — J9811 Atelectasis: Secondary | ICD-10-CM | POA: Diagnosis not present

## 2024-05-12 DIAGNOSIS — R4182 Altered mental status, unspecified: Secondary | ICD-10-CM | POA: Diagnosis not present

## 2024-05-12 DIAGNOSIS — M79621 Pain in right upper arm: Secondary | ICD-10-CM | POA: Diagnosis not present

## 2024-05-12 DIAGNOSIS — F0390 Unspecified dementia without behavioral disturbance: Secondary | ICD-10-CM | POA: Diagnosis not present

## 2024-05-12 DIAGNOSIS — R464 Slowness and poor responsiveness: Secondary | ICD-10-CM | POA: Diagnosis not present

## 2024-05-12 DIAGNOSIS — R000 Tachycardia, unspecified: Secondary | ICD-10-CM | POA: Diagnosis not present

## 2024-05-12 DIAGNOSIS — M79601 Pain in right arm: Secondary | ICD-10-CM | POA: Diagnosis not present

## 2024-05-12 LAB — CBC WITH DIFFERENTIAL/PLATELET
Abs Immature Granulocytes: 0.11 K/uL — ABNORMAL HIGH (ref 0.00–0.07)
Basophils Absolute: 0 K/uL (ref 0.0–0.1)
Basophils Relative: 0 %
Eosinophils Absolute: 0.1 K/uL (ref 0.0–0.5)
Eosinophils Relative: 1 %
HCT: 36.5 % (ref 36.0–46.0)
Hemoglobin: 11.7 g/dL — ABNORMAL LOW (ref 12.0–15.0)
Immature Granulocytes: 1 %
Lymphocytes Relative: 14 %
Lymphs Abs: 1.4 K/uL (ref 0.7–4.0)
MCH: 26.2 pg (ref 26.0–34.0)
MCHC: 32.1 g/dL (ref 30.0–36.0)
MCV: 81.7 fL (ref 80.0–100.0)
Monocytes Absolute: 0.5 K/uL (ref 0.1–1.0)
Monocytes Relative: 6 %
Neutro Abs: 7.3 K/uL (ref 1.7–7.7)
Neutrophils Relative %: 78 %
Platelets: 183 K/uL (ref 150–400)
RBC: 4.47 MIL/uL (ref 3.87–5.11)
RDW: 14.3 % (ref 11.5–15.5)
WBC: 9.5 K/uL (ref 4.0–10.5)
nRBC: 0 % (ref 0.0–0.2)

## 2024-05-12 LAB — COMPREHENSIVE METABOLIC PANEL WITH GFR
ALT: 18 U/L (ref 0–44)
AST: 28 U/L (ref 15–41)
Albumin: 3.4 g/dL — ABNORMAL LOW (ref 3.5–5.0)
Alkaline Phosphatase: 46 U/L (ref 38–126)
Anion gap: 12 (ref 5–15)
BUN: 37 mg/dL — ABNORMAL HIGH (ref 8–23)
CO2: 19 mmol/L — ABNORMAL LOW (ref 22–32)
Calcium: 9 mg/dL (ref 8.9–10.3)
Chloride: 106 mmol/L (ref 98–111)
Creatinine, Ser: 2.04 mg/dL — ABNORMAL HIGH (ref 0.44–1.00)
GFR, Estimated: 25 mL/min — ABNORMAL LOW (ref >60)
Glucose, Bld: 152 mg/dL — ABNORMAL HIGH (ref 70–99)
Potassium: 4.3 mmol/L (ref 3.5–5.1)
Sodium: 137 mmol/L (ref 135–145)
Total Bilirubin: 0.7 mg/dL (ref 0.0–1.2)
Total Protein: 5.6 g/dL — ABNORMAL LOW (ref 6.5–8.1)

## 2024-05-12 LAB — URINALYSIS, ROUTINE W REFLEX MICROSCOPIC
Bilirubin Urine: NEGATIVE
Glucose, UA: NEGATIVE mg/dL
Hgb urine dipstick: NEGATIVE
Ketones, ur: NEGATIVE mg/dL
Nitrite: NEGATIVE
Protein, ur: 100 mg/dL — AB
Specific Gravity, Urine: 1.013 (ref 1.005–1.030)
Squamous Epithelial / HPF: 0 /HPF (ref 0–5)
WBC, UA: >50 WBC/hpf (ref 0–5)
pH: 6 (ref 5.0–8.0)

## 2024-05-12 LAB — TROPONIN I (HIGH SENSITIVITY)
Troponin I (High Sensitivity): 10 ng/L (ref <18)
Troponin I (High Sensitivity): 15 ng/L (ref <18)

## 2024-05-12 LAB — MAGNESIUM: Magnesium: 2.2 mg/dL (ref 1.7–2.4)

## 2024-05-12 MED ORDER — SODIUM CHLORIDE 0.9 % IV SOLN
1.0000 g | Freq: Once | INTRAVENOUS | Status: AC
  Administered 2024-05-12: 1 g via INTRAVENOUS
  Filled 2024-05-12: qty 10

## 2024-05-12 MED ORDER — LACTATED RINGERS IV BOLUS
1000.0000 mL | Freq: Once | INTRAVENOUS | Status: AC
  Administered 2024-05-12: 1000 mL via INTRAVENOUS

## 2024-05-12 MED ORDER — ACETAMINOPHEN 500 MG PO TABS
1000.0000 mg | ORAL_TABLET | Freq: Once | ORAL | Status: AC
  Administered 2024-05-12: 1000 mg via ORAL
  Filled 2024-05-12: qty 2

## 2024-05-12 MED ORDER — CEFDINIR 300 MG PO CAPS: 300.0000 mg | ORAL_CAPSULE | Freq: Two times a day (BID) | ORAL | 0 refills | Status: AC

## 2024-05-12 NOTE — ED Provider Notes (Signed)
 [facility] Provider Note    Event Date/Time   First MD Initiated Contact with Patient 05/12/24 1338     (approximate)   History   Altered Mental Status   HPI  Jessica Morrison is a 78 y.o. female who presents to the ED for evaluation of Altered Mental Status   I review oncology clinic visit from 7/1.  CKD 4, dementia, recurrent bladder cancer  Patient presents to the ED for evaluation of lesser responsiveness, altered mentation and atraumatic right arm pain.  Majority of history is provided by patient's daughter at the bedside.  Patient lives at home independently but daughter lives next-door and she and her husband help take care of the patient.  She was at her baseline this morning and when daughter called around lunchtime to check on the patient she was not picking up the phone.  She checked on a security camera and patient was laying on the couch and not responding or answering the phone so son-in-law went to the house, she continued to be poorly responsive so they called 911.  Patient was reporting right arm pain, upper arm.  Humerus but no reported falls, no disarray at the house.   Physical Exam   Triage Vital Signs: ED Triage Vitals  Encounter Vitals Group     BP 05/12/24 1338 (!) 115/98     Girls Systolic BP Percentile --      Girls Diastolic BP Percentile --      Boys Systolic BP Percentile --      Boys Diastolic BP Percentile --      Pulse Rate 05/12/24 1338 (!) 105     Resp 05/12/24 1338 20     Temp 05/12/24 1338 98.3 F (36.8 C)     Temp src --      SpO2 05/12/24 1349 99 %     Weight 05/12/24 1339 97 lb (44 kg)     Height 05/12/24 1339 4' 9 (1.448 m)     Head Circumference --      Peak Flow --      Pain Score --      Pain Loc --      Pain Education --      Exclude from Growth Chart --     Most recent vital signs: Vitals:   05/12/24 1400 05/12/24 1430  BP: 127/71 137/75  Pulse: 91 89  Resp: (!) 22 (!) 22  Temp:     SpO2: 99% 100%    General: Awake, no distress.  CV:  Good peripheral perfusion.  Resp:  Normal effort.  Abd:  No distention.  MSK:  No deformity noted.  Cradling her right arm but no signs of trauma, swelling.  Right arm is well-perfused with a strong pulse and sensation is intact. Neuro:  No focal deficits appreciated. Other:     ED Results / Procedures / Treatments   Labs (all labs ordered are listed, but only abnormal results are displayed) Labs Reviewed  COMPREHENSIVE METABOLIC PANEL WITH GFR - Abnormal; Notable for the following components:      Result Value   CO2 19 (*)    Glucose, Bld 152 (*)    BUN 37 (*)    Creatinine, Ser 2.04 (*)    Total Protein 5.6 (*)    Albumin  3.4 (*)    GFR, Estimated 25 (*)    All other components within normal limits  CBC WITH DIFFERENTIAL/PLATELET - Abnormal; Notable for the following components:  Hemoglobin 11.7 (*)    Abs Immature Granulocytes 0.11 (*)    All other components within normal limits  URINALYSIS, ROUTINE W REFLEX MICROSCOPIC - Abnormal; Notable for the following components:   Color, Urine YELLOW (*)    APPearance HAZY (*)    Protein, ur 100 (*)    Leukocytes,Ua SMALL (*)    Bacteria, UA FEW (*)    All other components within normal limits  URINE CULTURE  MAGNESIUM   TROPONIN I (HIGH SENSITIVITY)    EKG Sinus rhythm with a rate of 105 bpm.  Normal axis and intervals.  No STEMI.  RADIOLOGY CXR interpreted by me without evidence of acute cardiopulmonary pathology. Plain film of the right humerus interpreted by me without evidence of fracture or dislocation  Official radiology report(s): DG Humerus Right Result Date: 05/12/2024 CLINICAL DATA:  Pain.  Altered mental status. EXAM: RIGHT HUMERUS - 2+ VIEW COMPARISON:  None Available. FINDINGS: No acute fracture or dislocation. No aggressive osseous lesion. No significant arthritis of acromio-clavicular or glenohumeral joints. No radiopaque foreign bodies. Soft tissues  are within normal limits. IMPRESSION: No acute osseous abnormality of the right humerus. Electronically Signed   By: Ree Molt M.D.   On: 05/12/2024 14:42   DG Chest Portable 1 View Result Date: 05/12/2024 CLINICAL DATA:  Altered mental status. EXAM: PORTABLE CHEST 1 VIEW COMPARISON:  Chest [place]  dated 12/09/2023. FINDINGS: Pressor Port-A-Cath with tip at the cavoatrial junction. Shallow inspiration. Left lung base atelectasis. Pneumonia is less likely. No pleural effusion pneumothorax. Stable cardiac silhouette. No acute osseous pathology. IMPRESSION: Left lung base atelectasis. Electronically Signed   By: Vanetta Chou M.D.   On: 05/12/2024 14:41    PROCEDURES and INTERVENTIONS:  Procedures  Medications  acetaminophen  (TYLENOL ) tablet 1,000 mg (has no administration in time range)  cefTRIAXone  (ROCEPHIN ) 1 g in sodium chloride  0.9 % 100 mL IVPB (has no administration in time range)  lactated ringers  bolus 1,000 mL (1,000 mLs Intravenous New Bag/Given 05/12/24 1405)     IMPRESSION / MDM / ASSESSMENT AND PLAN / ED COURSE  I reviewed the triage vital signs and the nursing notes.  Differential diagnosis includes, but is not limited to, metabolic encephalopathy, DVT, PE, stroke, ICH or intracranial metastases, sepsis  {Patient presents with symptoms of an acute illness or injury that is potentially life-threatening.  Patient presents to the ED with poor responsiveness, possibly related to UTI.  She has right arm pain but no signs of trauma.  Some localized tenderness on exam.  Renal dysfunction near baseline, normal CBC, troponin.  X-rays are unrevealing.  Urine with infectious features, sent for culture and she was started on antibiotics.  Awaiting CT head and venous ultrasound to assess for DVT of the right arm at the time of signout.  Clinical Course as of 05/12/24 1514  Tue May 12, 2024  1452 Reassessed and discussed plan of care [DS]  1512 Urinalysis noted, sent for  culture and ordered antibiotics.  Patient signed out to oncoming physician to follow-up on CT head and ultrasound of the arm.  Patient may be suitable for outpatient management with antibiotics in the custody of family. [DS]    Clinical Course User Index [DS] Claudene Rover, MD     FINAL CLINICAL IMPRESSION(S) / ED DIAGNOSES   Final diagnoses:  None     Rx / DC Orders   ED Discharge Orders     None        Note:  This document was prepared using  [employer] and may include unintentional dictation errors.   Claudene Rover, MD 05/12/24 [phone]

## 2024-05-12 NOTE — ED Notes (Signed)
 Called CCMD for central monitoring

## 2024-05-12 NOTE — ED Notes (Signed)
 [place] at bedside

## 2024-05-12 NOTE — ED Provider Notes (Signed)
 Procedures  Clinical Course as of 05/12/24 1742  Tue May 12, 2024  1452 Reassessed and discussed plan of care [DS]  1512 Urinalysis noted, sent for culture and ordered antibiotics.  Patient signed out to oncoming physician to follow-up on CT head and ultrasound of the arm.  Patient may be suitable for outpatient management with antibiotics in the custody of family. [DS]    Clinical Course User Index [DS] Claudene Rover, MD    ----------------------------------------- 5:42 PM on 05/12/2024 ----------------------------------------- Remaining workup reveals urinary tract infection.  CT head and right upper extremity ultrasound unremarkable.  She does complain of persistent pain at the right distal humerus just proximal to the elbow.  She has some point tenderness of the soft tissue of this area, no bony deformity.  X-ray negative.  I doubt septic joint or other infectious complication.  Engaged in shared decision-making with the patient and her family member at bedside, they prefer to treat her UTI at home.  Offered hospitalization but they are comfortable with discharge and follow-up.     Viviann Pastor, MD 05/12/24 [phone]

## 2024-05-12 NOTE — ED Triage Notes (Signed)
 Pt to ED via [facility] from home. Family called out around 1130am due to pt unresponsiveness. Daughter reports that pt was at baseline at 7am this morning. Pt was lying on the couch and not communicating to family. hx of alzheimer's. Hx of bladder cancer. NO recent medication changes. Denies taking a blood thinner. Pt is guarding right arm at this time. Family is unsure of any recent falls. EMS reports giving 250cc NS due to pt being tachycardic. Pt lives independently at home.    BP 160/80 P 106 RR 24 O2 96 RA CBG 139

## 2024-05-12 NOTE — Progress Notes (Signed)
 [facility] Liaison Note  This patient is currently followed by [facility]'s outpatient based palliative care program.  [facility] will follow through discharge dispositon.  Please call with any palliative care related questions or concerns  [facility] Liaison 336 [phone]

## 2024-05-12 NOTE — ED Notes (Signed)
 Called lab to add on trop

## 2024-05-14 ENCOUNTER — Ambulatory Visit: Admitting: Oncology

## 2024-05-14 ENCOUNTER — Ambulatory Visit

## 2024-05-14 ENCOUNTER — Other Ambulatory Visit

## 2024-05-15 LAB — URINE CULTURE: Culture: >=100000 — AB

## 2024-05-18 ENCOUNTER — Other Ambulatory Visit: Payer: PPO

## 2024-05-25 ENCOUNTER — Ambulatory Visit: Payer: PPO | Admitting: Oncology

## 2024-05-25 ENCOUNTER — Encounter: Admitting: Hospice and Palliative Medicine

## 2024-05-25 ENCOUNTER — Telehealth: Payer: Self-pay | Admitting: *Deleted

## 2024-05-25 NOTE — Telephone Encounter (Signed)
 Family want hospice.  Also hospice wants to see whether it is going to be [facility] for attending or not.  I am sending the message to [facility] for this

## 2024-05-26 ENCOUNTER — Other Ambulatory Visit: Payer: Self-pay | Admitting: Hospice and Palliative Medicine

## 2024-05-26 DIAGNOSIS — C642 Malignant neoplasm of left kidney, except renal pelvis: Secondary | ICD-10-CM

## 2024-06-04 ENCOUNTER — Other Ambulatory Visit

## 2024-06-04 ENCOUNTER — Ambulatory Visit

## 2024-06-04 ENCOUNTER — Ambulatory Visit: Admitting: Nurse Practitioner

## 2024-06-08 ENCOUNTER — Encounter: Payer: Self-pay | Admitting: Hospice and Palliative Medicine

## 2024-06-11 ENCOUNTER — Inpatient Hospital Stay: Admitting: Hospice and Palliative Medicine

## 2024-08-14 ENCOUNTER — Encounter: Payer: Self-pay | Admitting: Oncology

## 2024-09-14 DEATH — deceased

## 2025-03-30 ENCOUNTER — Ambulatory Visit: Admitting: Dermatology
